# Patient Record
Sex: Male | Born: 1955 | Race: White | Hispanic: No | Marital: Married
Health system: Southern US, Community
[De-identification: ages and names within clinical notes are randomized; demographics above are authoritative.]

## PROBLEM LIST (undated history)

## (undated) DIAGNOSIS — I87099 Postthrombotic syndrome with other complications of unspecified lower extremity: Secondary | ICD-10-CM

## (undated) DIAGNOSIS — J80 Acute respiratory distress syndrome: Secondary | ICD-10-CM

## (undated) DIAGNOSIS — J9621 Acute and chronic respiratory failure with hypoxia: Secondary | ICD-10-CM

## (undated) DIAGNOSIS — U071 COVID-19: Secondary | ICD-10-CM

## (undated) DIAGNOSIS — J159 Unspecified bacterial pneumonia: Secondary | ICD-10-CM

---

## 2020-12-27 ENCOUNTER — Inpatient Hospital Stay
Admission: RE | Admit: 2020-12-27 | Discharge: 2021-04-05 | Disposition: A | Discharge status: Rehabilitation Facility | Payer: Federal, State, Local not specified - PPO | Source: Other Acute Inpatient Hospital | Attending: Internal Medicine | Admitting: Internal Medicine

## 2020-12-27 ENCOUNTER — Other Ambulatory Visit (HOSPITAL_COMMUNITY): Payer: Federal, State, Local not specified - PPO

## 2020-12-27 DIAGNOSIS — R0902 Hypoxemia: Secondary | ICD-10-CM

## 2020-12-27 DIAGNOSIS — J189 Pneumonia, unspecified organism: Secondary | ICD-10-CM

## 2020-12-27 DIAGNOSIS — J90 Pleural effusion, not elsewhere classified: Secondary | ICD-10-CM

## 2020-12-27 DIAGNOSIS — Z931 Gastrostomy status: Secondary | ICD-10-CM

## 2020-12-27 DIAGNOSIS — R509 Fever, unspecified: Secondary | ICD-10-CM

## 2020-12-27 DIAGNOSIS — J811 Chronic pulmonary edema: Secondary | ICD-10-CM

## 2020-12-27 DIAGNOSIS — Z9889 Other specified postprocedural states: Secondary | ICD-10-CM

## 2020-12-27 DIAGNOSIS — I4891 Unspecified atrial fibrillation: Secondary | ICD-10-CM

## 2020-12-27 DIAGNOSIS — I87099 Postthrombotic syndrome with other complications of unspecified lower extremity: Secondary | ICD-10-CM | POA: Diagnosis present

## 2020-12-27 DIAGNOSIS — R6889 Other general symptoms and signs: Secondary | ICD-10-CM

## 2020-12-27 DIAGNOSIS — D72829 Elevated white blood cell count, unspecified: Secondary | ICD-10-CM

## 2020-12-27 DIAGNOSIS — J80 Acute respiratory distress syndrome: Secondary | ICD-10-CM | POA: Diagnosis present

## 2020-12-27 DIAGNOSIS — J9621 Acute and chronic respiratory failure with hypoxia: Secondary | ICD-10-CM | POA: Diagnosis present

## 2020-12-27 DIAGNOSIS — J9690 Respiratory failure, unspecified, unspecified whether with hypoxia or hypercapnia: Secondary | ICD-10-CM

## 2020-12-27 DIAGNOSIS — Z9911 Dependence on respirator [ventilator] status: Secondary | ICD-10-CM

## 2020-12-27 DIAGNOSIS — R001 Bradycardia, unspecified: Secondary | ICD-10-CM

## 2020-12-27 DIAGNOSIS — U071 COVID-19: Secondary | ICD-10-CM | POA: Diagnosis present

## 2020-12-27 DIAGNOSIS — J159 Unspecified bacterial pneumonia: Secondary | ICD-10-CM | POA: Diagnosis present

## 2020-12-27 DIAGNOSIS — R0603 Acute respiratory distress: Secondary | ICD-10-CM

## 2020-12-27 HISTORY — DX: COVID-19: U07.1

## 2020-12-27 HISTORY — DX: Acute respiratory distress syndrome: J80

## 2020-12-27 HISTORY — DX: Postthrombotic syndrome with other complications of unspecified lower extremity: I87.099

## 2020-12-27 HISTORY — DX: Acute and chronic respiratory failure with hypoxia: J96.21

## 2020-12-27 HISTORY — DX: Unspecified bacterial pneumonia: J15.9

## 2020-12-28 ENCOUNTER — Other Ambulatory Visit (HOSPITAL_COMMUNITY): Payer: Federal, State, Local not specified - PPO

## 2020-12-28 DIAGNOSIS — U071 COVID-19: Secondary | ICD-10-CM

## 2020-12-28 DIAGNOSIS — J80 Acute respiratory distress syndrome: Secondary | ICD-10-CM

## 2020-12-28 DIAGNOSIS — I87099 Postthrombotic syndrome with other complications of unspecified lower extremity: Secondary | ICD-10-CM

## 2020-12-28 DIAGNOSIS — J9621 Acute and chronic respiratory failure with hypoxia: Secondary | ICD-10-CM

## 2020-12-28 DIAGNOSIS — Z9911 Dependence on respirator [ventilator] status: Secondary | ICD-10-CM

## 2020-12-28 DIAGNOSIS — J159 Unspecified bacterial pneumonia: Secondary | ICD-10-CM | POA: Diagnosis not present

## 2020-12-28 LAB — URINALYSIS, ROUTINE W REFLEX MICROSCOPIC
Bacteria, UA: NONE SEEN
Bilirubin Urine: NEGATIVE
Glucose, UA: NEGATIVE mg/dL
Ketones, ur: NEGATIVE mg/dL
Nitrite: NEGATIVE
Protein, ur: NEGATIVE mg/dL
Specific Gravity, Urine: 1.027 (ref 1.005–1.030)
pH: 6 (ref 5.0–8.0)

## 2020-12-28 LAB — BLOOD GAS, ARTERIAL
Acid-Base Excess: 3.7 mmol/L — ABNORMAL HIGH (ref 0.0–2.0)
Bicarbonate: 28 mmol/L (ref 20.0–28.0)
FIO2: 35
O2 Saturation: 90.1 %
Patient temperature: 36.3
pCO2 arterial: 42.6 mmHg (ref 32.0–48.0)
pH, Arterial: 7.429 (ref 7.350–7.450)
pO2, Arterial: 54.7 mmHg — ABNORMAL LOW (ref 83.0–108.0)

## 2020-12-28 MED ORDER — IOHEXOL 300 MG/ML  SOLN
100.0000 mL | Freq: Once | INTRAMUSCULAR | Status: AC
  Administered 2020-12-28: 100 mL via INTRAVENOUS

## 2020-12-28 NOTE — Consult Note (Signed)
Pulmonary Critical Care Medicine [facility] [place]  PULMONARY SERVICE  Date of Service: 12/28/2020  PULMONARY CRITICAL CARE CONSULT   Scott Roberts  IRS:[id number]  DOB: 1956-06-07   DOA: 12/27/2020  Referring Physician: [name], MD  HPI: Scott Roberts is a 65 y.o. male seen for follow up of Acute on Chronic Respiratory Failure.  Patient is transferred to our facility for further management and weaning.  Patient has multiple medical problems including hypertension type 2 diabetes hyperlipidemia who came into the hospital with complaints of increasing shortness of breath.  Patient was started on steroids however had decline in status and ended up having to be intubated.  After being on the ventilator for approximately 11 days patient was extubated however declined and had to be reintubated.  Patient subsequently had a tracheostomy done.  Complications associated with COVID-19 were noted including development of DVT pulmonary embolism for which the patient required anticoagulation.  Patient also did have renal failure requiring hemodialysis.  He is now transferred for further management and weaning.  Review of Systems:  ROS performed and is unremarkable other than noted above.  Past medical history: Diabetes mellitus type 2 DVT COVID-19 Hypertension Hyperlipidemia Respiratory failure  Past surgical history: Tracheostomy Peg  Family history: Noncontributory  Allergies: Reviewed on the medical record.    Medications: Reviewed on Rounds  Physical Exam:  Vitals: Temperature is 98.2 pulse 89 respiratory rate 28 blood pressures 155/87 saturations 92%  Ventilator Settings on assist control FiO2 was 45% volume 400 PEEP 5   General: Comfortable at this time  Eyes: Grossly normal lids, irises & conjunctiva  ENT: grossly tongue is normal  Neck: no obvious mass  Cardiovascular: S1-S2 normal no gallop or rub  Respiratory: No rhonchi coarse breath  sound  Abdomen: Soft and nontender  Skin: no rash seen on limited exam  Musculoskeletal: not rigid  Psychiatric:unable to assess  Neurologic: no seizure no involuntary movements         Labs on Admission:  Basic Metabolic Panel: Recent Labs  Lab 12/28/20 0419  NA 144  K 3.8  CL 107  CO2 26  GLUCOSE 97  BUN 29*  CREATININE 0.98  CALCIUM 7.6*    Recent Labs  Lab 12/28/20 0536  PHART 7.429  PCO2ART 42.6  PO2ART 54.7*  HCO3 28.0  O2SAT 90.1    Liver Function Tests: No results for input(s): AST, ALT, ALKPHOS, BILITOT, PROT, ALBUMIN in the last 168 hours. No results for input(s): LIPASE, AMYLASE in the last 168 hours. No results for input(s): AMMONIA in the last 168 hours.  CBC: Recent Labs  Lab 12/28/20 0419  WBC 14.0*  HGB 7.8*  HCT 25.5*  MCV 96.2  PLT 396    Cardiac Enzymes: No results for input(s): CKTOTAL, CKMB, CKMBINDEX, TROPONINI in the last 168 hours.  BNP (last 3 results) No results for input(s): BNP in the last 8760 hours.  ProBNP (last 3 results) No results for input(s): PROBNP in the last 8760 hours.   Radiological Exams on Admission: DG Chest 1 View  Result Date: 12/27/2020 CLINICAL DATA:  Status post PEG tube placement respiratory failure EXAM: CHEST  1 VIEW COMPARISON:  None. FINDINGS: Tracheostomy tube with tip about 4 cm superior to carina. Small left-sided pleural effusion. Widespread interstitial and alveolar disease. More confluent consolidation at the left base. Obscured cardiomediastinal silhouette. No pneumothorax. IMPRESSION: Widespread interstitial and alveolar disease which may reflect pulmonary edema or diffuse pneumonia. There are small bilateral pleural effusions. Electronically  Signed   By: Jasmine Pang M.D.   On: 12/27/2020 22:55   DG ABDOMEN PEG TUBE LOCATION  Result Date: 12/27/2020 CLINICAL DATA:  Percutaneous gastrostomy tube EXAM: ABDOMEN - 1 VIEW COMPARISON:  None. FINDINGS: Cutaneous staples to the left of  midline. Gastrostomy tube balloon projects over the body of the stomach. Mild gas-filled central small bowel with paucity of distal gas. IMPRESSION: Gastrostomy tube balloon projects over the body of the stomach. Mild gas-filled central small bowel, question ileus or obstruction. Electronically Signed   By: Jasmine Pang M.D.   On: 12/27/2020 22:56    Assessment/Plan Active Problems:   Acute on chronic respiratory failure with hypoxia (HCC)   COVID-19 virus infection   Acute respiratory distress syndrome (ARDS) due to COVID-19 virus [facility])   Healthcare associated bacterial pneumonia   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 Acute on chronic respiratory failure with hypoxia we will continue with assist control mode titrate oxygen continue pulmonary toilet patient's mechanics will be checked by the respiratory therapist today.  Chest x-ray results as noted above 2. COVID-19 virus infection recovery phase patient has had prolonged protracted course including development of pneumonitis as well as some hypercoagulable state. 3. DVT patient has been treated with Lovenox plan is to continue to monitor treated. 4. Healthcare associated pneumonia patient still has some changes of diffuse pneumonitis likely related to the underlying COVID-19 we are going to continue to monitor closely for any progression. 5. ARDS due to COVID-19 slow in resolution we will continue to follow along  I have personally seen and evaluated the patient, evaluated laboratory and imaging results, formulated the assessment and plan and placed orders. The Patient requires high complexity decision making with multiple systems involvement.  Case was discussed on Rounds with the Respiratory Therapy Director and the Respiratory staff Time Spent  Yevonne Pax, MD [facility] Pulmonary Critical Care Medicine Sleep Medicine

## 2020-12-29 DIAGNOSIS — J9621 Acute and chronic respiratory failure with hypoxia: Secondary | ICD-10-CM | POA: Diagnosis not present

## 2020-12-29 DIAGNOSIS — I87099 Postthrombotic syndrome with other complications of unspecified lower extremity: Secondary | ICD-10-CM | POA: Diagnosis not present

## 2020-12-29 DIAGNOSIS — U071 COVID-19: Secondary | ICD-10-CM | POA: Diagnosis not present

## 2020-12-29 DIAGNOSIS — J159 Unspecified bacterial pneumonia: Secondary | ICD-10-CM | POA: Diagnosis not present

## 2020-12-29 LAB — PHOSPHORUS: Phosphorus: 2.7 mg/dL (ref 2.5–4.6)

## 2020-12-29 LAB — HEMOGLOBIN A1C
Hgb A1c MFr Bld: 5.2 % (ref 4.8–5.6)
Mean Plasma Glucose: 102.54 mg/dL

## 2020-12-29 LAB — TSH: TSH: 2.546 u[IU]/mL (ref 0.350–4.500)

## 2020-12-29 LAB — MAGNESIUM: Magnesium: 1.5 mg/dL — ABNORMAL LOW (ref 1.7–2.4)

## 2020-12-29 LAB — T4, FREE: Free T4: 1.37 ng/dL — ABNORMAL HIGH (ref 0.61–1.12)

## 2020-12-29 NOTE — Progress Notes (Signed)
Pulmonary Critical Care Medicine [facility] [place]   PULMONARY CRITICAL CARE SERVICE  PROGRESS NOTE  Date of Service: 01/05/2021  Scott Roberts  QHU:[id number]  DOB: 1956/02/06   DOA: 12/27/2020  Referring Physician: [name], MD  HPI: Scott Roberts is a 65 y.o. adult seen for follow up of Acute on Chronic Respiratory Failure.  Patient currently is on full support Supposed to do about 4 hours of pressure support  Medications: Reviewed on Rounds  Physical Exam:  Vitals: Temperature is 98.7 pulse 109 respiratory rate is 37 blood pressure is 158/90 saturations 100%  Ventilator Settings on assist control FiO2 35% tidal volume 400 PEEP 5  . General: Comfortable at this time . Eyes: Grossly normal lids, irises & conjunctiva . ENT: grossly tongue is normal . Neck: no obvious mass . Cardiovascular: S1 S2 normal no gallop . Respiratory: No rhonchi no rales noted at this time . Abdomen: soft . Skin: no rash seen on limited exam . Musculoskeletal: not rigid . Psychiatric:unable to assess . Neurologic: no seizure no involuntary movements         Lab Data:   Basic Metabolic Panel: Recent Labs  Lab 12/30/20 0605 12/30/20 2359 01/01/21 0505 01/02/21 0428 01/03/21 0449 01/04/21 0700 01/05/21 0710  NA 144 144 145 146* 146* 145  --   K 3.5 3.4* 4.0  --  3.8 4.1  --   CL 105 109 108  --  107 105  --   CO2 27 26 28   --  30 31  --   GLUCOSE 150* 133* 141*  --  146* 162*  --   BUN 21 19 21   --  23 34*  --   CREATININE 0.77 0.66 0.64  --  0.61 0.64  --   CALCIUM 7.9* 8.0* 8.4*  --  8.3* 8.7*  --   MG 1.7 1.8 1.6* 1.8  --  1.7 1.8  PHOS 3.1 2.1* 2.5  --   --   --   --     ABG: Recent Labs  Lab 12/31/20 1540 01/01/21 0454 01/04/21 0730 01/04/21 1015 01/05/21 0640  PHART 7.378 7.307* 7.071* 7.320* 7.354  PCO2ART 51.0* 61.7* >120.0* 65.0* 59.4*  PO2ART 179* 85.5 74.8* 114* 128*  HCO3 29.4* 30.3* 33.6* 32.5* 32.2*  O2SAT 99.6 97.1 91.3 98.7 98.9     Liver Function Tests: No results for input(s): AST, ALT, ALKPHOS, BILITOT, PROT, ALBUMIN in the last 168 hours. No results for input(s): LIPASE, AMYLASE in the last 168 hours. Recent Labs  Lab 01/04/21 0415  AMMONIA 62*    CBC: Recent Labs  Lab 12/30/20 2359 12/31/20 0854 01/01/21 0505 01/03/21 0449 01/04/21 0700  WBC 16.4* 16.0* 15.2* 12.5* 19.9*  HGB 7.1* 7.7* 7.6* 7.5* 8.1*  HCT 22.2* 23.7* 25.4* 25.1* 28.5*  MCV 95.7 95.2 98.1 100.0 104.4*  PLT 433* 520* 511* 494* 712*    Cardiac Enzymes: No results for input(s): CKTOTAL, CKMB, CKMBINDEX, TROPONINI in the last 168 hours.  BNP (last 3 results) No results for input(s): BNP in the last 8760 hours.  ProBNP (last 3 results) No results for input(s): PROBNP in the last 8760 hours.  Radiological Exams: DG CHEST PORT 1 VIEW  Result Date: 01/04/2021 CLINICAL DATA:  Increased oxygen demand. EXAM: PORTABLE CHEST 1 VIEW COMPARISON:  Chest x-ray 01/02/2021, 01/01/2021. FINDINGS: Tracheostomy noted in stable position. Stable cardiomegaly. No pulmonary venous congestion. Diffuse dense bilateral pulmonary infiltrates. No significant interim change. Small bilateral pleural effusions can not be  excluded. No pneumothorax. No acute bony abnormality. IMPRESSION: 1. Tracheostomy tube in stable position. Diffuse dense bilateral pulmonary infiltrates/edema again noted. No significant interim change. 2.  Stable cardiomegaly.  No pulmonary venous congestion. Electronically Signed   By: Maisie Fus  Register   On: 01/04/2021 07:50    Assessment/Plan Active Problems:   Acute on chronic respiratory failure with hypoxia (HCC)   COVID-19 virus infection   Acute respiratory distress syndrome (ARDS) due to COVID-19 virus [facility])   Healthcare associated bacterial pneumonia   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 Acute on chronic respiratory failure hypoxia we will continue with weaning attempts goal is for 4 hours of  pressure support weaning 2. COVID-19 virus infection recovery phase we will continue to follow 3. ARDS treated slow improvement 4. Healthcare associated pneumonia patient has received antibiotics plan is to continue with supportive care 5. Chronic DVT treated we will continue with supportive care patient's been on anticoagulation   I have personally seen and evaluated the patient, evaluated laboratory and imaging results, formulated the assessment and plan and placed orders. The Patient requires high complexity decision making with multiple systems involvement.  Rounds were done with the Respiratory Therapy Director and Staff therapists and discussed with nursing staff also.  Yevonne Pax, MD [facility] Pulmonary Critical Care Medicine Sleep Medicine

## 2020-12-30 ENCOUNTER — Other Ambulatory Visit (HOSPITAL_COMMUNITY): Payer: Federal, State, Local not specified - PPO

## 2020-12-30 DIAGNOSIS — J9621 Acute and chronic respiratory failure with hypoxia: Secondary | ICD-10-CM | POA: Diagnosis not present

## 2020-12-30 DIAGNOSIS — U071 COVID-19: Secondary | ICD-10-CM | POA: Diagnosis not present

## 2020-12-30 DIAGNOSIS — J159 Unspecified bacterial pneumonia: Secondary | ICD-10-CM | POA: Diagnosis not present

## 2020-12-30 DIAGNOSIS — I87099 Postthrombotic syndrome with other complications of unspecified lower extremity: Secondary | ICD-10-CM | POA: Diagnosis not present

## 2020-12-30 LAB — BASIC METABOLIC PANEL
Anion gap: 11 (ref 5–15)
Anion gap: 12 (ref 5–15)
BUN: 29 mg/dL — ABNORMAL HIGH (ref 8–23)
BUN: 21 mg/dL (ref 8–23)
CO2: 26 mmol/L (ref 22–32)
CO2: 27 mmol/L (ref 22–32)
Calcium: 7.6 mg/dL — ABNORMAL LOW (ref 8.9–10.3)
Calcium: 7.9 mg/dL — ABNORMAL LOW (ref 8.9–10.3)
Chloride: 107 mmol/L (ref 98–111)
Chloride: 105 mmol/L (ref 98–111)
Creatinine, Ser: 0.98 mg/dL (ref 0.61–1.24)
Creatinine, Ser: 0.77 mg/dL (ref 0.61–1.24)
GFR, Estimated: >60 mL/min (ref >60)
GFR, Estimated: >60 mL/min (ref >60)
Glucose, Bld: 97 mg/dL (ref 70–99)
Glucose, Bld: 150 mg/dL — ABNORMAL HIGH (ref 70–99)
Potassium: 3.8 mmol/L (ref 3.5–5.1)
Potassium: 3.5 mmol/L (ref 3.5–5.1)
Sodium: 144 mmol/L (ref 135–145)
Sodium: 144 mmol/L (ref 135–145)

## 2020-12-30 LAB — CBC
HCT: 25.5 % — ABNORMAL LOW (ref 39.0–52.0)
HCT: 25.2 % — ABNORMAL LOW (ref 39.0–52.0)
HCT: 25.2 % — ABNORMAL LOW (ref 39.0–52.0)
Hemoglobin: 7.8 g/dL — ABNORMAL LOW (ref 13.0–17.0)
Hemoglobin: 8 g/dL — ABNORMAL LOW (ref 13.0–17.0)
Hemoglobin: 8 g/dL — ABNORMAL LOW (ref 13.0–17.0)
MCH: 29.4 pg (ref 26.0–34.0)
MCH: 30.3 pg (ref 26.0–34.0)
MCH: 30.4 pg (ref 26.0–34.0)
MCHC: 30.6 g/dL (ref 30.0–36.0)
MCHC: 31.7 g/dL (ref 30.0–36.0)
MCHC: 31.7 g/dL (ref 30.0–36.0)
MCV: 96.2 fL (ref 80.0–100.0)
MCV: 95.5 fL (ref 80.0–100.0)
MCV: 95.8 fL (ref 80.0–100.0)
Platelets: 396 10*3/uL (ref 150–400)
Platelets: 413 10*3/uL — ABNORMAL HIGH (ref 150–400)
Platelets: 484 10*3/uL — ABNORMAL HIGH (ref 150–400)
RBC: 2.65 MIL/uL — ABNORMAL LOW (ref 4.22–5.81)
RBC: 2.64 MIL/uL — ABNORMAL LOW (ref 4.22–5.81)
RBC: 2.63 MIL/uL — ABNORMAL LOW (ref 4.22–5.81)
RDW: 15 % (ref 11.5–15.5)
RDW: 14.8 % (ref 11.5–15.5)
RDW: 14.9 % (ref 11.5–15.5)
WBC: 14 10*3/uL — ABNORMAL HIGH (ref 4.0–10.5)
WBC: 13.9 10*3/uL — ABNORMAL HIGH (ref 4.0–10.5)
WBC: 19.9 10*3/uL — ABNORMAL HIGH (ref 4.0–10.5)
nRBC: 0 % (ref 0.0–0.2)
nRBC: 0 % (ref 0.0–0.2)
nRBC: 0 % (ref 0.0–0.2)

## 2020-12-30 LAB — COMPREHENSIVE METABOLIC PANEL
ALT: 20 U/L (ref 0–44)
AST: 21 U/L (ref 15–41)
Albumin: 2 g/dL — ABNORMAL LOW (ref 3.5–5.0)
Alkaline Phosphatase: 82 U/L (ref 38–126)
Anion gap: 11 (ref 5–15)
BUN: 20 mg/dL (ref 8–23)
CO2: 27 mmol/L (ref 22–32)
Calcium: 7.8 mg/dL — ABNORMAL LOW (ref 8.9–10.3)
Chloride: 106 mmol/L (ref 98–111)
Creatinine, Ser: 0.87 mg/dL (ref 0.61–1.24)
GFR, Estimated: >60 mL/min (ref >60)
Glucose, Bld: 128 mg/dL — ABNORMAL HIGH (ref 70–99)
Potassium: 3.4 mmol/L — ABNORMAL LOW (ref 3.5–5.1)
Sodium: 144 mmol/L (ref 135–145)
Total Bilirubin: 0.5 mg/dL (ref 0.3–1.2)
Total Protein: 5.7 g/dL — ABNORMAL LOW (ref 6.5–8.1)

## 2020-12-30 LAB — URINE CULTURE: Culture: <10000 — AB

## 2020-12-30 LAB — PHOSPHORUS: Phosphorus: 3.1 mg/dL (ref 2.5–4.6)

## 2020-12-30 LAB — TROPONIN I (HIGH SENSITIVITY)
Troponin I (High Sensitivity): 32 ng/L — ABNORMAL HIGH (ref <18)
Troponin I (High Sensitivity): 31 ng/L — ABNORMAL HIGH (ref <18)

## 2020-12-30 LAB — MAGNESIUM: Magnesium: 1.7 mg/dL (ref 1.7–2.4)

## 2020-12-30 NOTE — Progress Notes (Signed)
Pulmonary Critical Care Medicine [facility] [place]   PULMONARY CRITICAL CARE SERVICE  PROGRESS NOTE  Date of Service: 12/30/2020  TREW SUNDE  WVP:[id number]  DOB: [birth year]   DOA: 12/27/2020  Referring Physician: [name], MD  HPI: ROWAN BLAKER is a 65 y.o. adult seen for follow up of Acute on Chronic Respiratory Failure. Patient at this time is on pressure support has been on 40% FiO2 currently is weaning on pressure of 12/5  Medications: Reviewed on Rounds  Physical Exam:  Vitals: Temperature is 97.7 pulse 96 respiratory rate 37 blood pressure is 145/87 saturations 95%  Ventilator Settings on pressure support FiO2 40% pressure 12/5   General: Comfortable at this time  Eyes: Grossly normal lids, irises & conjunctiva  ENT: grossly tongue is normal  Neck: no obvious mass  Cardiovascular: S1 S2 normal no gallop  Respiratory: No rhonchi no rales noted at this time  Abdomen: soft  Skin: no rash seen on limited exam  Musculoskeletal: not rigid  Psychiatric:unable to assess  Neurologic: no seizure no involuntary movements         Lab Data:   Basic Metabolic Panel: Recent Labs  Lab 12/28/20 0419 12/29/20 0634 12/30/20 0605  NA 144 144 144  K 3.8 3.4* 3.5  CL 107 106 105  CO2 26 27 27   GLUCOSE 97 128* 150*  BUN 29* 20 21  CREATININE 0.98 0.87 0.77  CALCIUM 7.6* 7.8* 7.9*  MG  --  1.5* 1.7  PHOS  --  2.7 3.1    ABG: Recent Labs  Lab 12/28/20 0536  PHART 7.429  PCO2ART 42.6  PO2ART 54.7*  HCO3 28.0  O2SAT 90.1    Liver Function Tests: Recent Labs  Lab 12/29/20 0634  AST 21  ALT 20  ALKPHOS 82  BILITOT 0.5  PROT 5.7*  ALBUMIN 2.0*   No results for input(s): LIPASE, AMYLASE in the last 168 hours. No results for input(s): AMMONIA in the last 168 hours.  CBC: Recent Labs  Lab 12/28/20 0419 12/29/20 0634 12/30/20 0605  WBC 14.0* 13.9* 19.9*  HGB 7.8* 8.0* 8.0*  HCT 25.5* 25.2* 25.2*  MCV 96.2 95.5 95.8  PLT 396  413* 484*    Cardiac Enzymes: No results for input(s): CKTOTAL, CKMB, CKMBINDEX, TROPONINI in the last 168 hours.  BNP (last 3 results) No results for input(s): BNP in the last 8760 hours.  ProBNP (last 3 results) No results for input(s): PROBNP in the last 8760 hours.  Radiological Exams: CT ABDOMEN PELVIS W CONTRAST  Result Date: 12/28/2020 CLINICAL DATA:  COVID complications. Small-bowel distention on x-ray. Evaluate for obstruction. EXAM: CT ABDOMEN AND PELVIS WITH CONTRAST TECHNIQUE: Multidetector CT imaging of the abdomen and pelvis was performed using the standard protocol following bolus administration of intravenous contrast. CONTRAST:  02/25/2021 OMNIPAQUE IOHEXOL 300 MG/ML  SOLN COMPARISON:  X-ray 12/27/2020 FINDINGS: Lower chest: Extensive bilateral airspace disease is associated with moderate bilateral pleural effusions. Hepatobiliary: 12 mm hypoattenuating liver lesion 1 image 17/series 3 cannot be definitively characterized but is likely benign. There is no evidence for gallstones, gallbladder wall thickening, or pericholecystic fluid. No intrahepatic or extrahepatic biliary dilation. Pancreas: No focal mass lesion. No dilatation of the main duct. No intraparenchymal cyst. No peripancreatic edema. Spleen: No splenomegaly. No focal mass lesion. Adrenals/Urinary Tract: No adrenal nodule or mass. Right kidney unremarkable. Filling defects are seen in the left renal pelvis and posterior calices (well seen on coronal images 111-115 of series 6). No evidence for  hydroureter. Foley catheter decompresses the urinary bladder and gas in the bladder is compatible with the instrumentation. Stomach/Bowel: Small hiatal hernia. Gastrostomy tube noted. Duodenum is normally positioned as is the ligament of Treitz. No small bowel wall thickening. No small bowel dilatation. The terminal ileum is normal. The appendix is not visualized, but there is no edema or inflammation in the region of the cecum. No gross  colonic mass. No colonic wall thickening. Diverticular changes are noted in the left colon without evidence of diverticulitis. Rectal tube noted in situ. Vascular/Lymphatic: No abdominal aortic aneurysm. No abdominal lymphadenopathy. Reproductive: Unremarkable. Other: Trace intraperitoneal free fluid. Musculoskeletal: Gas in the subcutaneous fat of the anterior abdominal wall likely related to injection site. Diffuse body wall edema evident. No worrisome lytic or sclerotic osseous abnormality. IMPRESSION: 1. No evidence for small bowel obstruction. 2. Extensive bilateral airspace disease associated with moderate bilateral pleural effusions. Imaging features compatible with multifocal pneumonia. 3. Filling defects in the left renal pelvis and posterior calices. Findings could be related to blood clot or infectious debris. Urothelial neoplasm not excluded. 4. 12 mm hypoattenuating liver lesion cannot be definitively characterized but is likely benign. MRI of the abdomen with and without contrast could be used to further evaluate as clinically warranted. 5. Small volume intraperitoneal free fluid. 6. Diffuse body wall edema. Electronically Signed   By: Kennith Center M.D.   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 Acute on chronic respiratory failure with hypoxia we'll continue to wean on pressure support titrate oxygen continue pulmonary toilet. 2. COVID-19 virus infection in recovery we will continue with supportive care. 3. ARDS treated slow improvement is noted.  Patient had to have the ventilator adjustments made. 4. Healthcare associated pneumonia treated slow improvement 5. Chronic DVT has been on anticoagulation  no sign of bleed   I have personally seen and evaluated the patient, evaluated laboratory and imaging results, formulated the assessment and plan and placed orders. The Patient requires high complexity decision making with multiple systems involvement.  Rounds were done with the Respiratory Therapy Director and Staff therapists and discussed with nursing staff also.  Yevonne Pax, MD [facility] Pulmonary Critical Care Medicine Sleep Medicine

## 2020-12-31 ENCOUNTER — Other Ambulatory Visit (HOSPITAL_COMMUNITY): Payer: Federal, State, Local not specified - PPO

## 2020-12-31 DIAGNOSIS — U071 COVID-19: Secondary | ICD-10-CM | POA: Diagnosis not present

## 2020-12-31 DIAGNOSIS — I87099 Postthrombotic syndrome with other complications of unspecified lower extremity: Secondary | ICD-10-CM | POA: Diagnosis not present

## 2020-12-31 DIAGNOSIS — J159 Unspecified bacterial pneumonia: Secondary | ICD-10-CM | POA: Diagnosis not present

## 2020-12-31 DIAGNOSIS — J9621 Acute and chronic respiratory failure with hypoxia: Secondary | ICD-10-CM | POA: Diagnosis not present

## 2020-12-31 LAB — CBC
HCT: 22.2 % — ABNORMAL LOW (ref 39.0–52.0)
HCT: 23.7 % — ABNORMAL LOW (ref 39.0–52.0)
Hemoglobin: 7.1 g/dL — ABNORMAL LOW (ref 13.0–17.0)
Hemoglobin: 7.7 g/dL — ABNORMAL LOW (ref 13.0–17.0)
MCH: 30.6 pg (ref 26.0–34.0)
MCH: 30.9 pg (ref 26.0–34.0)
MCHC: 32 g/dL (ref 30.0–36.0)
MCHC: 32.5 g/dL (ref 30.0–36.0)
MCV: 95.2 fL (ref 80.0–100.0)
MCV: 95.7 fL (ref 80.0–100.0)
Platelets: 433 10*3/uL — ABNORMAL HIGH (ref 150–400)
Platelets: 520 10*3/uL — ABNORMAL HIGH (ref 150–400)
RBC: 2.32 MIL/uL — ABNORMAL LOW (ref 4.22–5.81)
RBC: 2.49 MIL/uL — ABNORMAL LOW (ref 4.22–5.81)
RDW: 15.1 % (ref 11.5–15.5)
RDW: 15.2 % (ref 11.5–15.5)
WBC: 16 10*3/uL — ABNORMAL HIGH (ref 4.0–10.5)
WBC: 16.4 10*3/uL — ABNORMAL HIGH (ref 4.0–10.5)
nRBC: 0 % (ref 0.0–0.2)
nRBC: 0 % (ref 0.0–0.2)

## 2020-12-31 LAB — BASIC METABOLIC PANEL
Anion gap: 9 (ref 5–15)
BUN: 19 mg/dL (ref 8–23)
CO2: 26 mmol/L (ref 22–32)
Calcium: 8 mg/dL — ABNORMAL LOW (ref 8.9–10.3)
Chloride: 109 mmol/L (ref 98–111)
Creatinine, Ser: 0.66 mg/dL (ref 0.61–1.24)
GFR, Estimated: >60 mL/min (ref >60)
Glucose, Bld: 133 mg/dL — ABNORMAL HIGH (ref 70–99)
Potassium: 3.4 mmol/L — ABNORMAL LOW (ref 3.5–5.1)
Sodium: 144 mmol/L (ref 135–145)

## 2020-12-31 LAB — CULTURE, RESPIRATORY W GRAM STAIN

## 2020-12-31 LAB — BLOOD GAS, ARTERIAL
Acid-Base Excess: 3.4 mmol/L — ABNORMAL HIGH (ref 0.0–2.0)
Acid-Base Excess: 4.5 mmol/L — ABNORMAL HIGH (ref 0.0–2.0)
Bicarbonate: 29.7 mmol/L — ABNORMAL HIGH (ref 20.0–28.0)
Bicarbonate: 29.4 mmol/L — ABNORMAL HIGH (ref 20.0–28.0)
FIO2: 40
FIO2: 40
O2 Saturation: 92.4 %
O2 Saturation: 99.6 %
Patient temperature: 36.7
Patient temperature: 36.7
pCO2 arterial: 64.5 mmHg — ABNORMAL HIGH (ref 32.0–48.0)
pCO2 arterial: 51 mmHg — ABNORMAL HIGH (ref 32.0–48.0)
pH, Arterial: 7.283 — ABNORMAL LOW (ref 7.350–7.450)
pH, Arterial: 7.378 (ref 7.350–7.450)
pO2, Arterial: 67.4 mmHg — ABNORMAL LOW (ref 83.0–108.0)
pO2, Arterial: 179 mmHg — ABNORMAL HIGH (ref 83.0–108.0)

## 2020-12-31 LAB — MAGNESIUM: Magnesium: 1.8 mg/dL (ref 1.7–2.4)

## 2020-12-31 LAB — OCCULT BLOOD X 1 CARD TO LAB, STOOL: Fecal Occult Bld: NEGATIVE

## 2020-12-31 LAB — PHOSPHORUS: Phosphorus: 2.1 mg/dL — ABNORMAL LOW (ref 2.5–4.6)

## 2020-12-31 LAB — TROPONIN I (HIGH SENSITIVITY): Troponin I (High Sensitivity): 29 ng/L — ABNORMAL HIGH (ref <18)

## 2020-12-31 NOTE — Progress Notes (Signed)
Pulmonary Critical Care Medicine [facility] [place]   PULMONARY CRITICAL CARE SERVICE  PROGRESS NOTE  Date of Service: 12/31/2020  Scott Roberts  DJS:[id number]  DOB: [birth year]   DOA: 12/27/2020  Referring Physician: [name], MD  HPI: Scott Roberts is a 65 y.o. adult seen for follow up of Acute on Chronic Respiratory Failure.  Patient is on full support on assist control mode currently on 35% FiO2 with a PEEP of 5 good saturations  Medications: Reviewed on Rounds  Physical Exam:  Vitals: Temperature is 98.4 pulse 107 respiratory 35 blood pressure is 155/92 saturations 99%  Ventilator Settings on Assist control FiO2 is 35% tidal volume 400 PEEP 5  . General: Comfortable at this time . Eyes: Grossly normal lids, irises & conjunctiva . ENT: grossly tongue is normal . Neck: no obvious mass . Cardiovascular: S1 S2 normal no gallop . Respiratory: Scattered rhonchi expansion is equal . Abdomen: soft . Skin: no rash seen on limited exam . Musculoskeletal: not rigid . Psychiatric:unable to assess . Neurologic: no seizure no involuntary movements         Lab Data:   Basic Metabolic Panel: Recent Labs  Lab 12/28/20 0419 12/29/20 0634 12/30/20 0605 12/30/20 2359  NA 144 144 144 144  K 3.8 3.4* 3.5 3.4*  CL 107 106 105 109  CO2 26 27 27 26   GLUCOSE 97 128* 150* 133*  BUN 29* 20 21 19   CREATININE 0.98 0.87 0.77 0.66  CALCIUM 7.6* 7.8* 7.9* 8.0*  MG  --  1.5* 1.7 1.8  PHOS  --  2.7 3.1 2.1*    ABG: Recent Labs  Lab 12/28/20 0536  PHART 7.429  PCO2ART 42.6  PO2ART 54.7*  HCO3 28.0  O2SAT 90.1    Liver Function Tests: Recent Labs  Lab 12/29/20 0634  AST 21  ALT 20  ALKPHOS 82  BILITOT 0.5  PROT 5.7*  ALBUMIN 2.0*   No results for input(s): LIPASE, AMYLASE in the last 168 hours. No results for input(s): AMMONIA in the last 168 hours.  CBC: Recent Labs  Lab 12/28/20 0419 12/29/20 0634 12/30/20 0605 12/30/20 2359 12/31/20 0854   WBC 14.0* 13.9* 19.9* 16.4* 16.0*  HGB 7.8* 8.0* 8.0* 7.1* 7.7*  HCT 25.5* 25.2* 25.2* 22.2* 23.7*  MCV 96.2 95.5 95.8 95.7 95.2  PLT 396 413* 484* 433* 520*    Cardiac Enzymes: No results for input(s): CKTOTAL, CKMB, CKMBINDEX, TROPONINI in the last 168 hours.  BNP (last 3 results) No results for input(s): BNP in the last 8760 hours.  ProBNP (last 3 results) No results for input(s): PROBNP in the last 8760 hours.  Radiological Exams: DG Chest Port 1 View  Result Date: 12/31/2020 CLINICAL DATA:  COVID-19 infection plus pulmonary edema. Evaluate pleural effusions. EXAM: PORTABLE CHEST 1 VIEW COMPARISON:  Chest x-rays dated 12/30/2020 and 12/27/2020. FINDINGS: Tracheostomy tube appears stable in position with tip at the midline. Heart size and mediastinal contours appear stable. Diffuse bilateral airspace opacities are unchanged. Small bilateral pleural effusions appears stable. Suspect superimposed atelectasis at the LEFT lung base. No new lung findings. No pneumothorax is seen. IMPRESSION: 1. Stable chest x-ray. Diffuse bilateral airspace opacities are unchanged, compatible with multifocal pneumonia and/or pulmonary edema. Favor pneumonia. 2. Stable small bilateral pleural effusions. Probable superimposed atelectasis at the LEFT lung base. Electronically Signed   By: 02/27/2021 M.D.   On: 12/31/2020 07:36   DG Chest Port 1 View  Result Date: 12/30/2020 CLINICAL DATA:  Bradycardia  EXAM: PORTABLE CHEST 1 VIEW COMPARISON:  December 27, 2020 FINDINGS: The cardiomediastinal silhouette is unchanged and obscured in contour.Tracheostomy with tip terminating approximately 5.3 cm above the carina. Small bilateral pleural effusions. This is similar comparison to prior. No pneumothorax. Diffuse interstitial and heterogeneous opacities, unchanged. Visualized abdomen is unremarkable. Multilevel degenerative changes of the thoracic spine. IMPRESSION: Small bilateral pleural effusions and diffuse  interstitial and heterogeneous opacities, unchanged. Findings may reflect a combination of the sequela of COVID-19 infection plus pulmonary edema. 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 Acute on chronic respiratory failure hypoxia plan is going to be to continue with full support on the ventilator.  Patient apparently suffered a rapid response with bradycardia episode holding off on weaning this morning 2. COVID-19 virus infection in recovery phase we will continue with supportive care 3. ARDS very slow improvement we will continue to follow along. 4. Healthcare associated pneumonia treated with antibiotics we will continue to monitor. 5. Chronic DVT on anticoagulation   I have personally seen and evaluated the patient, evaluated laboratory and imaging results, formulated the assessment and plan and placed orders. The Patient requires high complexity decision making with multiple systems involvement.  Rounds were done with the Respiratory Therapy Director and Staff therapists and discussed with nursing staff also.  Yevonne Pax, MD [facility] Pulmonary Critical Care Medicine Sleep Medicine

## 2021-01-01 ENCOUNTER — Other Ambulatory Visit (HOSPITAL_COMMUNITY): Payer: Federal, State, Local not specified - PPO

## 2021-01-01 DIAGNOSIS — I87099 Postthrombotic syndrome with other complications of unspecified lower extremity: Secondary | ICD-10-CM | POA: Diagnosis not present

## 2021-01-01 DIAGNOSIS — J9621 Acute and chronic respiratory failure with hypoxia: Secondary | ICD-10-CM | POA: Diagnosis not present

## 2021-01-01 DIAGNOSIS — J159 Unspecified bacterial pneumonia: Secondary | ICD-10-CM | POA: Diagnosis not present

## 2021-01-01 DIAGNOSIS — U071 COVID-19: Secondary | ICD-10-CM | POA: Diagnosis not present

## 2021-01-01 LAB — BASIC METABOLIC PANEL
Anion gap: 9 (ref 5–15)
BUN: 21 mg/dL (ref 8–23)
CO2: 28 mmol/L (ref 22–32)
Calcium: 8.4 mg/dL — ABNORMAL LOW (ref 8.9–10.3)
Chloride: 108 mmol/L (ref 98–111)
Creatinine, Ser: 0.64 mg/dL (ref 0.61–1.24)
GFR, Estimated: >60 mL/min (ref >60)
Glucose, Bld: 141 mg/dL — ABNORMAL HIGH (ref 70–99)
Potassium: 4 mmol/L (ref 3.5–5.1)
Sodium: 145 mmol/L (ref 135–145)

## 2021-01-01 LAB — BLOOD GAS, ARTERIAL
Acid-Base Excess: 4.2 mmol/L — ABNORMAL HIGH (ref 0.0–2.0)
Bicarbonate: 30.3 mmol/L — ABNORMAL HIGH (ref 20.0–28.0)
FIO2: 40
O2 Saturation: 97.1 %
Patient temperature: 36.1
pCO2 arterial: 61.7 mmHg — ABNORMAL HIGH (ref 32.0–48.0)
pH, Arterial: 7.307 — ABNORMAL LOW (ref 7.350–7.450)
pO2, Arterial: 85.5 mmHg (ref 83.0–108.0)

## 2021-01-01 LAB — PHOSPHORUS: Phosphorus: 2.5 mg/dL (ref 2.5–4.6)

## 2021-01-01 LAB — CBC
HCT: 25.4 % — ABNORMAL LOW (ref 39.0–52.0)
Hemoglobin: 7.6 g/dL — ABNORMAL LOW (ref 13.0–17.0)
MCH: 29.3 pg (ref 26.0–34.0)
MCHC: 29.9 g/dL — ABNORMAL LOW (ref 30.0–36.0)
MCV: 98.1 fL (ref 80.0–100.0)
Platelets: 511 10*3/uL — ABNORMAL HIGH (ref 150–400)
RBC: 2.59 MIL/uL — ABNORMAL LOW (ref 4.22–5.81)
RDW: 15.4 % (ref 11.5–15.5)
WBC: 15.2 10*3/uL — ABNORMAL HIGH (ref 4.0–10.5)
nRBC: 0 % (ref 0.0–0.2)

## 2021-01-01 LAB — MAGNESIUM: Magnesium: 1.6 mg/dL — ABNORMAL LOW (ref 1.7–2.4)

## 2021-01-01 NOTE — Progress Notes (Signed)
Pulmonary Critical Care Medicine [facility] [place]   PULMONARY CRITICAL CARE SERVICE  PROGRESS NOTE  Date of Service: 01/01/2021  Scott Roberts  GDJ:[id number]  DOB: [birth year]   DOA: 12/27/2020  Referring Physician: [name], MD  HPI: Scott Roberts is a 65 y.o. adult seen for follow up of Acute on Chronic Respiratory Failure.  Patient is on pressure support currently on 40% FiO2 goal of up to 12-hour  Medications: Reviewed on Rounds  Physical Exam:  Vitals: Temperature is 97.0 pulse 97 respiratory 30 blood pressure is 134/71 saturations 100%  Ventilator Settings on pressure support FiO2 40% pressure of 12/7  . General: Comfortable at this time . Eyes: Grossly normal lids, irises & conjunctiva . ENT: grossly tongue is normal . Neck: no obvious mass . Cardiovascular: S1 S2 normal no gallop . Respiratory: No rhonchi no rales are noted at this . Abdomen: soft . Skin: no rash seen on limited exam . Musculoskeletal: not rigid . Psychiatric:unable to assess . Neurologic: no seizure no involuntary movements         Lab Data:   Basic Metabolic Panel: Recent Labs  Lab 12/28/20 0419 12/29/20 0634 12/30/20 0605 12/30/20 2359 01/01/21 0505  NA 144 144 144 144 145  K 3.8 3.4* 3.5 3.4* 4.0  CL 107 106 105 109 108  CO2 26 27 27 26 28   GLUCOSE 97 128* 150* 133* 141*  BUN 29* 20 21 19 21   CREATININE 0.98 0.87 0.77 0.66 0.64  CALCIUM 7.6* 7.8* 7.9* 8.0* 8.4*  MG  --  1.5* 1.7 1.8 1.6*  PHOS  --  2.7 3.1 2.1* 2.5    ABG: Recent Labs  Lab 12/28/20 0536 12/31/20 1235 12/31/20 1540 01/01/21 0454  PHART 7.429 7.283* 7.378 7.307*  PCO2ART 42.6 64.5* 51.0* 61.7*  PO2ART 54.7* 67.4* 179* 85.5  HCO3 28.0 29.7* 29.4* 30.3*  O2SAT 90.1 92.4 99.6 97.1    Liver Function Tests: Recent Labs  Lab 12/29/20 0634  AST 21  ALT 20  ALKPHOS 82  BILITOT 0.5  PROT 5.7*  ALBUMIN 2.0*   No results for input(s): LIPASE, AMYLASE in the last 168 hours. No results  for input(s): AMMONIA in the last 168 hours.  CBC: Recent Labs  Lab 12/29/20 0634 12/30/20 0605 12/30/20 2359 12/31/20 0854 01/01/21 0505  WBC 13.9* 19.9* 16.4* 16.0* 15.2*  HGB 8.0* 8.0* 7.1* 7.7* 7.6*  HCT 25.2* 25.2* 22.2* 23.7* 25.4*  MCV 95.5 95.8 95.7 95.2 98.1  PLT 413* 484* 433* 520* 511*    Cardiac Enzymes: No results for input(s): CKTOTAL, CKMB, CKMBINDEX, TROPONINI in the last 168 hours.  BNP (last 3 results) No results for input(s): BNP in the last 8760 hours.  ProBNP (last 3 results) No results for input(s): PROBNP in the last 8760 hours.  Radiological Exams: DG CHEST PORT 1 VIEW  Result Date: 01/01/2021 CLINICAL DATA:  Ventilator dependent respiratory failure. Pneumonia. EXAM: PORTABLE CHEST 1 VIEW COMPARISON:  Yesterday FINDINGS: Stable bilateral pulmonary infiltrates. No visible air leak. Small left pleural effusion. Cardiomegaly and vascular pedicle widening. Tracheostomy tube in similar position. IMPRESSION: Unchanged diffuse infiltrates with small left effusion. Electronically Signed   By: 03/01/21 M.D.   On: 01/01/2021 06:56   DG Chest Port 1 View  Result Date: 12/31/2020 CLINICAL DATA:  COVID-19 infection plus pulmonary edema. Evaluate pleural effusions. EXAM: PORTABLE CHEST 1 VIEW COMPARISON:  Chest x-rays dated 12/30/2020 and 12/27/2020. FINDINGS: Tracheostomy tube appears stable in position with tip at the  midline. Heart size and mediastinal contours appear stable. Diffuse bilateral airspace opacities are unchanged. Small bilateral pleural effusions appears stable. Suspect superimposed atelectasis at the LEFT lung base. No new lung findings. No pneumothorax is seen. IMPRESSION: 1. Stable chest x-ray. Diffuse bilateral airspace opacities are unchanged, compatible with multifocal pneumonia and/or pulmonary edema. Favor pneumonia. 2. Stable small bilateral pleural effusions. Probable superimposed atelectasis at the LEFT lung base. Electronically Signed    By: Bary Richard M.D.   On: 12/31/2020 07:36   DG Chest Port 1 View  Result Date: 12/30/2020 CLINICAL DATA:  Bradycardia EXAM: PORTABLE CHEST 1 VIEW COMPARISON:  December 27, 2020 FINDINGS: The cardiomediastinal silhouette is unchanged and obscured in contour.Tracheostomy with tip terminating approximately 5.3 cm above the carina. Small bilateral pleural effusions. This is similar comparison to prior. No pneumothorax. Diffuse interstitial and heterogeneous opacities, unchanged. Visualized abdomen is unremarkable. Multilevel degenerative changes of the thoracic spine. IMPRESSION: Small bilateral pleural effusions and diffuse interstitial and heterogeneous opacities, unchanged. Findings may reflect a combination of the sequela of COVID-19 infection plus pulmonary edema. 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 Acute on chronic respiratory failure hypoxia we will continue with the weaning protocol patient is making good progress so far 2. COVID-19 virus infection in recovery phase we will continue with supportive care 3. ARDS due to COVID-19 has shown some improvement we will continue to follow 4. Healthcare associated pneumonia repeated with antibiotics consider infectious disease evaluation 5. DVT treated no active bleeding   I have personally seen and evaluated the patient, evaluated laboratory and imaging results, formulated the assessment and plan and placed orders. The Patient requires high complexity decision making with multiple systems involvement.  Rounds were done with the Respiratory Therapy Director and Staff therapists and discussed with nursing  staff also.  Yevonne Pax, MD [facility] Pulmonary Critical Care Medicine Sleep Medicine

## 2021-01-02 ENCOUNTER — Other Ambulatory Visit (HOSPITAL_COMMUNITY): Payer: Federal, State, Local not specified - PPO

## 2021-01-02 DIAGNOSIS — J9621 Acute and chronic respiratory failure with hypoxia: Secondary | ICD-10-CM | POA: Diagnosis not present

## 2021-01-02 DIAGNOSIS — I87099 Postthrombotic syndrome with other complications of unspecified lower extremity: Secondary | ICD-10-CM | POA: Diagnosis not present

## 2021-01-02 DIAGNOSIS — J159 Unspecified bacterial pneumonia: Secondary | ICD-10-CM | POA: Diagnosis not present

## 2021-01-02 DIAGNOSIS — U071 COVID-19: Secondary | ICD-10-CM | POA: Diagnosis not present

## 2021-01-02 LAB — MAGNESIUM: Magnesium: 1.8 mg/dL (ref 1.7–2.4)

## 2021-01-02 LAB — SODIUM: Sodium: 146 mmol/L — ABNORMAL HIGH (ref 135–145)

## 2021-01-02 NOTE — Progress Notes (Signed)
Pulmonary Critical Care Medicine [facility] [place]   PULMONARY CRITICAL CARE SERVICE  PROGRESS NOTE  Date of Service: 01/02/2021  Scott Roberts  LZJ:[id number]  DOB: [birth year]   DOA: 12/27/2020  Referring Physician: [name], MD  HPI: Scott Roberts is a 65 y.o. adult seen for follow up of Acute on Chronic Respiratory Failure.  Patient currently is resting comfortably without distress has been on weaning protocol did 12 hours of pressure support today we will continue to build on vent  Medications: Reviewed on Rounds  Physical Exam:  Vitals: Temperature 98.4 pulse 109 respiratory rate 36 blood pressure 117/88 saturations 97%  Ventilator Settings currently is on pressure support 12/7 we will continue with the weaning right now requiring 40% FiO2  . General: Comfortable at this time . Eyes: Grossly normal lids, irises & conjunctiva . ENT: grossly tongue is normal . Neck: no obvious mass . Cardiovascular: S1 S2 normal no gallop . Respiratory: No rhonchi very coarse breath sounds . Abdomen: soft . Skin: no rash seen on limited exam . Musculoskeletal: not rigid . Psychiatric:unable to assess . Neurologic: no seizure no involuntary movements         Lab Data:   Basic Metabolic Panel: Recent Labs  Lab 12/28/20 0419 12/29/20 0634 12/30/20 0605 12/30/20 2359 01/01/21 0505 01/02/21 0428  NA 144 144 144 144 145 146*  K 3.8 3.4* 3.5 3.4* 4.0  --   CL 107 106 105 109 108  --   CO2 26 27 27 26 28   --   GLUCOSE 97 128* 150* 133* 141*  --   BUN 29* 20 21 19 21   --   CREATININE 0.98 0.87 0.77 0.66 0.64  --   CALCIUM 7.6* 7.8* 7.9* 8.0* 8.4*  --   MG  --  1.5* 1.7 1.8 1.6* 1.8  PHOS  --  2.7 3.1 2.1* 2.5  --     ABG: Recent Labs  Lab 12/28/20 0536 12/31/20 1235 12/31/20 1540 01/01/21 0454  PHART 7.429 7.283* 7.378 7.307*  PCO2ART 42.6 64.5* 51.0* 61.7*  PO2ART 54.7* 67.4* 179* 85.5  HCO3 28.0 29.7* 29.4* 30.3*  O2SAT 90.1 92.4 99.6 97.1    Liver  Function Tests: Recent Labs  Lab 12/29/20 0634  AST 21  ALT 20  ALKPHOS 82  BILITOT 0.5  PROT 5.7*  ALBUMIN 2.0*   No results for input(s): LIPASE, AMYLASE in the last 168 hours. No results for input(s): AMMONIA in the last 168 hours.  CBC: Recent Labs  Lab 12/29/20 0634 12/30/20 0605 12/30/20 2359 12/31/20 0854 01/01/21 0505  WBC 13.9* 19.9* 16.4* 16.0* 15.2*  HGB 8.0* 8.0* 7.1* 7.7* 7.6*  HCT 25.2* 25.2* 22.2* 23.7* 25.4*  MCV 95.5 95.8 95.7 95.2 98.1  PLT 413* 484* 433* 520* 511*    Cardiac Enzymes: No results for input(s): CKTOTAL, CKMB, CKMBINDEX, TROPONINI in the last 168 hours.  BNP (last 3 results) No results for input(s): BNP in the last 8760 hours.  ProBNP (last 3 results) No results for input(s): PROBNP in the last 8760 hours.  Radiological Exams: DG CHEST PORT 1 VIEW  Result Date: 01/02/2021 CLINICAL DATA:  Atrial fibrillation. EXAM: PORTABLE CHEST 1 VIEW COMPARISON:  Radiograph yesterday. FINDINGS: Tracheostomy tube unchanged in position at the thoracic inlet. Cardiomegaly is not significantly changed. Stable mediastinal contours. Heterogeneous bilateral lung opacities are not significantly changed. Small bilateral pleural effusions. No pneumothorax. IMPRESSION: Unchanged cardiomegaly, bilateral heterogeneous lung opacities and small pleural effusions. No significant change from  prior imaging. Electronically Signed   By: Narda Rutherford M.D.   On: 01/02/2021 15:13   DG CHEST PORT 1 VIEW  Result Date: 01/01/2021 CLINICAL DATA:  Ventilator dependent respiratory failure. Pneumonia. EXAM: PORTABLE CHEST 1 VIEW COMPARISON:  Yesterday FINDINGS: Stable bilateral pulmonary infiltrates. No visible air leak. Small left pleural effusion. Cardiomegaly and vascular pedicle widening. Tracheostomy tube in similar position. IMPRESSION: Unchanged diffuse infiltrates with small left effusion. Electronically Signed   By: Marnee Spring M.D.   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 Acute on chronic respiratory failure with hypoxia plan is going to continue to wean on protocol pressure support as tolerated patient currently is on 40% FiO2 2. ARDS treated slow improvement we will continue to monitor 3. COVID-19 virus infection recovery phase 4. Healthcare associated pneumonia has been treated follow-up x-rays 5. Chronic DVT treated with slow improvement   I have personally seen and evaluated the patient, evaluated laboratory and imaging results, formulated the assessment and plan and placed orders. The Patient requires high complexity decision making with multiple systems involvement.  Rounds were done with the Respiratory Therapy Director and Staff therapists and discussed with nursing staff also.  Yevonne Pax, MD [facility] Pulmonary Critical Care Medicine Sleep Medicine

## 2021-01-03 ENCOUNTER — Other Ambulatory Visit (HOSPITAL_COMMUNITY): Payer: Federal, State, Local not specified - PPO

## 2021-01-03 DIAGNOSIS — J9621 Acute and chronic respiratory failure with hypoxia: Secondary | ICD-10-CM | POA: Diagnosis not present

## 2021-01-03 DIAGNOSIS — U071 COVID-19: Secondary | ICD-10-CM | POA: Diagnosis not present

## 2021-01-03 DIAGNOSIS — I87099 Postthrombotic syndrome with other complications of unspecified lower extremity: Secondary | ICD-10-CM | POA: Diagnosis not present

## 2021-01-03 DIAGNOSIS — J159 Unspecified bacterial pneumonia: Secondary | ICD-10-CM | POA: Diagnosis not present

## 2021-01-03 LAB — CBC
HCT: 25.1 % — ABNORMAL LOW (ref 39.0–52.0)
Hemoglobin: 7.5 g/dL — ABNORMAL LOW (ref 13.0–17.0)
MCH: 29.9 pg (ref 26.0–34.0)
MCHC: 29.9 g/dL — ABNORMAL LOW (ref 30.0–36.0)
MCV: 100 fL (ref 80.0–100.0)
Platelets: 494 10*3/uL — ABNORMAL HIGH (ref 150–400)
RBC: 2.51 MIL/uL — ABNORMAL LOW (ref 4.22–5.81)
RDW: 15.7 % — ABNORMAL HIGH (ref 11.5–15.5)
WBC: 12.5 10*3/uL — ABNORMAL HIGH (ref 4.0–10.5)
nRBC: 0 % (ref 0.0–0.2)

## 2021-01-03 LAB — BASIC METABOLIC PANEL
Anion gap: 9 (ref 5–15)
BUN: 23 mg/dL (ref 8–23)
CO2: 30 mmol/L (ref 22–32)
Calcium: 8.3 mg/dL — ABNORMAL LOW (ref 8.9–10.3)
Chloride: 107 mmol/L (ref 98–111)
Creatinine, Ser: 0.61 mg/dL (ref 0.61–1.24)
GFR, Estimated: >60 mL/min (ref >60)
Glucose, Bld: 146 mg/dL — ABNORMAL HIGH (ref 70–99)
Potassium: 3.8 mmol/L (ref 3.5–5.1)
Sodium: 146 mmol/L — ABNORMAL HIGH (ref 135–145)

## 2021-01-03 LAB — ECHOCARDIOGRAM COMPLETE
AR max vel: 3.41 cm2
AV Area VTI: 3.24 cm2
AV Area mean vel: 2.97 cm2
AV Mean grad: 6 mmHg
AV Peak grad: 11.3 mmHg
Ao pk vel: 1.68 m/s
S' Lateral: 3.4 cm

## 2021-01-03 NOTE — Progress Notes (Signed)
  Echocardiogram 2D Echocardiogram has been performed.  Gerda Diss 01/03/2021, 9:32 AM

## 2021-01-03 NOTE — Progress Notes (Signed)
Pulmonary Critical Care Medicine [facility] [place]   PULMONARY CRITICAL CARE SERVICE  PROGRESS NOTE  Date of Service: 01/03/2021  Scott Roberts  EML:[id number]  DOB: 1956-03-20   DOA: 12/27/2020  Referring Physician: [name], MD  HPI: Scott Roberts is a 65 y.o. adult seen for follow up of Acute on Chronic Respiratory Failure.  Patient remains on pressure support mode we will try to wean patient has been having increased heart rate on the last attempt at the weaning.  This morning was back on pressure support and we will be trying to wean once again.  Medications: Reviewed on Rounds  Physical Exam:  Vitals: Temperature 98.4 pulse 92 respiratory rate 28 blood pressure is 153/84 saturations 99%  Ventilator Settings on pressure support FiO2 40% pressure of 12/7  . General: Comfortable at this time . Eyes: Grossly normal lids, irises & conjunctiva . ENT: grossly tongue is normal . Neck: no obvious mass . Cardiovascular: S1 S2 normal no gallop . Respiratory: No rhonchi very coarse breath sounds . Abdomen: soft . Skin: no rash seen on limited exam . Musculoskeletal: not rigid . Psychiatric:unable to assess . Neurologic: no seizure no involuntary movements         Lab Data:   Basic Metabolic Panel: Recent Labs  Lab 12/29/20 0634 12/30/20 0605 12/30/20 2359 01/01/21 0505 01/02/21 0428 01/03/21 0449  NA 144 144 144 145 146* 146*  K 3.4* 3.5 3.4* 4.0  --  3.8  CL 106 105 109 108  --  107  CO2 27 27 26 28   --  30  GLUCOSE 128* 150* 133* 141*  --  146*  BUN 20 21 19 21   --  23  CREATININE 0.87 0.77 0.66 0.64  --  0.61  CALCIUM 7.8* 7.9* 8.0* 8.4*  --  8.3*  MG 1.5* 1.7 1.8 1.6* 1.8  --   PHOS 2.7 3.1 2.1* 2.5  --   --     ABG: Recent Labs  Lab 12/28/20 0536 12/31/20 1235 12/31/20 1540 01/01/21 0454  PHART 7.429 7.283* 7.378 7.307*  PCO2ART 42.6 64.5* 51.0* 61.7*  PO2ART 54.7* 67.4* 179* 85.5  HCO3 28.0 29.7* 29.4* 30.3*  O2SAT 90.1 92.4 99.6  97.1    Liver Function Tests: Recent Labs  Lab 12/29/20 0634  AST 21  ALT 20  ALKPHOS 82  BILITOT 0.5  PROT 5.7*  ALBUMIN 2.0*   No results for input(s): LIPASE, AMYLASE in the last 168 hours. No results for input(s): AMMONIA in the last 168 hours.  CBC: Recent Labs  Lab 12/30/20 0605 12/30/20 2359 12/31/20 0854 01/01/21 0505 01/03/21 0449  WBC 19.9* 16.4* 16.0* 15.2* 12.5*  HGB 8.0* 7.1* 7.7* 7.6* 7.5*  HCT 25.2* 22.2* 23.7* 25.4* 25.1*  MCV 95.8 95.7 95.2 98.1 100.0  PLT 484* 433* 520* 511* 494*    Cardiac Enzymes: No results for input(s): CKTOTAL, CKMB, CKMBINDEX, TROPONINI in the last 168 hours.  BNP (last 3 results) No results for input(s): BNP in the last 8760 hours.  ProBNP (last 3 results) No results for input(s): PROBNP in the last 8760 hours.  Radiological Exams: DG CHEST PORT 1 VIEW  Result Date: 01/02/2021 CLINICAL DATA:  Atrial fibrillation. EXAM: PORTABLE CHEST 1 VIEW COMPARISON:  Radiograph yesterday. FINDINGS: Tracheostomy tube unchanged in position at the thoracic inlet. Cardiomegaly is not significantly changed. Stable mediastinal contours. Heterogeneous bilateral lung opacities are not significantly changed. Small bilateral pleural effusions. No pneumothorax. IMPRESSION: Unchanged cardiomegaly, bilateral heterogeneous lung opacities  and small pleural effusions. No significant change from prior imaging. Electronically Signed   By: Narda Rutherford M.D.   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 Acute on chronic respiratory failure with hypoxia plan is to continue with the pressure support wean will titrate oxygen as tolerated and continue with aggressive  pulmonary toilet.  Chest x-ray that was done showed cardiomegaly with bilateral opacities small effusions 2. COVID-19 virus infection recovery phase we will continue to follow 3. ARDS treated with slow improvement 4. Healthcare associated pneumonia has been on antibiotics 5. Chronic DVT treated continue to follow along   I have personally seen and evaluated the patient, evaluated laboratory and imaging results, formulated the assessment and plan and placed orders. The Patient requires high complexity decision making with multiple systems involvement.  Rounds were done with the Respiratory Therapy Director and Staff therapists and discussed with nursing staff also.  Yevonne Pax, MD [facility] Pulmonary Critical Care Medicine Sleep Medicine

## 2021-01-04 ENCOUNTER — Other Ambulatory Visit (HOSPITAL_COMMUNITY): Payer: Federal, State, Local not specified - PPO

## 2021-01-04 DIAGNOSIS — U071 COVID-19: Secondary | ICD-10-CM | POA: Diagnosis not present

## 2021-01-04 DIAGNOSIS — I87099 Postthrombotic syndrome with other complications of unspecified lower extremity: Secondary | ICD-10-CM | POA: Diagnosis not present

## 2021-01-04 DIAGNOSIS — J159 Unspecified bacterial pneumonia: Secondary | ICD-10-CM | POA: Diagnosis not present

## 2021-01-04 DIAGNOSIS — J9621 Acute and chronic respiratory failure with hypoxia: Secondary | ICD-10-CM | POA: Diagnosis not present

## 2021-01-04 LAB — BLOOD GAS, ARTERIAL
Acid-Base Excess: 3.8 mmol/L — ABNORMAL HIGH (ref 0.0–2.0)
Acid-Base Excess: 6.6 mmol/L — ABNORMAL HIGH (ref 0.0–2.0)
Bicarbonate: 33.6 mmol/L — ABNORMAL HIGH (ref 20.0–28.0)
Bicarbonate: 32.5 mmol/L — ABNORMAL HIGH (ref 20.0–28.0)
FIO2: 60
FIO2: 60
O2 Saturation: 91.3 %
O2 Saturation: 98.7 %
Patient temperature: 37
Patient temperature: 37
pCO2 arterial: >120 mmHg (ref 32.0–48.0)
pCO2 arterial: 65 mmHg — ABNORMAL HIGH (ref 32.0–48.0)
pH, Arterial: 7.071 — CL (ref 7.350–7.450)
pH, Arterial: 7.32 — ABNORMAL LOW (ref 7.350–7.450)
pO2, Arterial: 74.8 mmHg — ABNORMAL LOW (ref 83.0–108.0)
pO2, Arterial: 114 mmHg — ABNORMAL HIGH (ref 83.0–108.0)

## 2021-01-04 LAB — BASIC METABOLIC PANEL
Anion gap: 9 (ref 5–15)
BUN: 34 mg/dL — ABNORMAL HIGH (ref 8–23)
CO2: 31 mmol/L (ref 22–32)
Calcium: 8.7 mg/dL — ABNORMAL LOW (ref 8.9–10.3)
Chloride: 105 mmol/L (ref 98–111)
Creatinine, Ser: 0.64 mg/dL (ref 0.61–1.24)
GFR, Estimated: >60 mL/min (ref >60)
Glucose, Bld: 162 mg/dL — ABNORMAL HIGH (ref 70–99)
Potassium: 4.1 mmol/L (ref 3.5–5.1)
Sodium: 145 mmol/L (ref 135–145)

## 2021-01-04 LAB — CBC
HCT: 28.5 % — ABNORMAL LOW (ref 39.0–52.0)
Hemoglobin: 8.1 g/dL — ABNORMAL LOW (ref 13.0–17.0)
MCH: 29.7 pg (ref 26.0–34.0)
MCHC: 28.4 g/dL — ABNORMAL LOW (ref 30.0–36.0)
MCV: 104.4 fL — ABNORMAL HIGH (ref 80.0–100.0)
Platelets: 712 10*3/uL — ABNORMAL HIGH (ref 150–400)
RBC: 2.73 MIL/uL — ABNORMAL LOW (ref 4.22–5.81)
RDW: 16.4 % — ABNORMAL HIGH (ref 11.5–15.5)
WBC: 19.9 10*3/uL — ABNORMAL HIGH (ref 4.0–10.5)
nRBC: 0.8 % — ABNORMAL HIGH (ref 0.0–0.2)

## 2021-01-04 LAB — MAGNESIUM: Magnesium: 1.7 mg/dL (ref 1.7–2.4)

## 2021-01-04 LAB — AMMONIA: Ammonia: 62 umol/L — ABNORMAL HIGH (ref 9–35)

## 2021-01-04 NOTE — Progress Notes (Signed)
[facility]   PULMONARY [facility]ITICAL [facility]ARE SERVICE  PROGRESS NOTE  Date of Service: 01/04/2021  Scott Roberts  ZOX:[id number]  DOB: 08-19-1956   DOA: 12/27/2020  Referring Physician: Carron CurieAli Hijazi, MD  HPI: Scott Roberts is a 65 y.o. adult seen for follow up of Acute on Chronic Respiratory Failure.  Patient currently is on the ventilator back on full support has been having increased work of breathing noted ABG results had shown PCO2 of greater than 120 so I did order chest x-ray to be done and patient was placed back on the ventilator  Medications: Reviewed on Rounds  Physical Exam:  Vitals: Temperature 97.7 pulse 83 respiratory rate 23 blood pressure 133/90 saturations 100%  Ventilator Settings currently is on assist control FiO2 60% tidal volume 400 with a PEEP of 7   General: Comfortable at this time  Eyes: Grossly normal lids, irises & conjunctiva  ENT: grossly tongue is normal  Neck: no obvious mass  Cardiovascular: S1 S2 normal no gallop  Respiratory: Scattered rhonchi very coarse breath sounds noted  Abdomen: soft  Skin: no rash seen on limited exam  Musculoskeletal: not rigid  Psychiatric:unable to assess  Neurologic: no seizure no involuntary movements         Lab Data:   Basic Metabolic Panel: Recent Labs  Lab 12/29/20 0634 12/30/20 0605 12/30/20 2359 01/01/21 0505 01/02/21 0428 01/03/21 0449 01/04/21 0700  NA 144 144 144 145 146* 146* 145  K 3.4* 3.5 3.4* 4.0  --  3.8 4.1  CL 106 105 109 108  --  107 105  CO2 27 27 26 28   --  30 31  GLUCOSE 128* 150* 133* 141*  --  146* 162*  BUN 20 21 19 21   --  23 34*  CREATININE 0.87 0.77 0.66 0.64  --  0.61 0.64  CALCIUM 7.8* 7.9* 8.0* 8.4*  --  8.3* 8.7*  MG 1.5* 1.7 1.8 1.6* 1.8  --  1.7  PHOS 2.7 3.1 2.1* 2.5  --   --   --     ABG: Recent Labs  Lab 12/31/20 1235 12/31/20 1540 01/01/21 0454 01/04/21 0730 01/04/21 1015  PHART 7.283* 7.378  7.307* 7.071* 7.320*  PCO2ART 64.5* 51.0* 61.7* >120.0* 65.0*  PO2ART 67.4* 179* 85.5 74.8* 114*  HCO3 29.7* 29.4* 30.3* 33.6* 32.5*  O2SAT 92.4 99.6 97.1 91.3 98.7    Liver Function Tests: Recent Labs  Lab 12/29/20 0634  AST 21  ALT 20  ALKPHOS 82  BILITOT 0.5  PROT 5.7*  ALBUMIN 2.0*   No results for input(s): LIPASE, AMYLASE in the last 168 hours. Recent Labs  Lab 01/04/21 0415  AMMONIA 62*    CBC: Recent Labs  Lab 12/30/20 2359 12/31/20 0854 01/01/21 0505 01/03/21 0449 01/04/21 0700  WBC 16.4* 16.0* 15.2* 12.5* 19.9*  HGB 7.1* 7.7* 7.6* 7.5* 8.1*  HCT 22.2* 23.7* 25.4* 25.1* 28.5*  MCV 95.7 95.2 98.1 100.0 104.4*  PLT 433* 520* 511* 494* 712*    Cardiac Enzymes: No results for input(s): CKTOTAL, CKMB, CKMBINDEX, TROPONINI in the last 168 hours.  BNP (last 3 results) No results for input(s): BNP in the last 8760 hours.  ProBNP (last 3 results) No results for input(s): PROBNP in the last 8760 hours.  Radiological Exams: DG CHEST PORT 1 VIEW  Result Date: 01/04/2021 CLINICAL DATA:  Increased oxygen demand. EXAM: PORTABLE CHEST 1 VIEW COMPARISON:  Chest x-ray 01/02/2021, 01/01/2021. FINDINGS: Tracheostomy noted in stable  position. Stable cardiomegaly. No pulmonary venous congestion. Diffuse dense bilateral pulmonary infiltrates. No significant interim change. Small bilateral pleural effusions can not be excluded. No pneumothorax. No acute bony abnormality. IMPRESSION: 1. Tracheostomy tube in stable position. Diffuse dense bilateral pulmonary infiltrates/edema again noted. No significant interim change. 2.  Stable cardiomegaly.  No pulmonary venous congestion. Electronically Signed   By: Maisie Fus  Register   On: 01/04/2021 07:50   ECHOCARDIOGRAM COMPLETE  Result Date: 01/03/2021    ECHOCARDIOGRAM REPORT   Patient Name:   Scott Roberts Date of Exam: 01/03/2021 Medical Rec #:  [id number]      Height: Accession #:    [id number]     Weight: Date of Birth:  19-Feb-1956       BSA: Patient Age:    64 years       BP:           131/76 mmHg Patient Gender: M              HR:           84 bpm. Exam Location:  Inpatient Procedure: 2D Echo, Cardiac Doppler and Color Doppler Indications:     Atrial fibrillation  History:         Patient has no prior history of Echocardiogram examinations.  Sonographer:     Ross Ludwig RDCS (AE) Referring Phys:  [id number] Florian Buff BROWN Diagnosing Phys: Orpah Cobb MD  Sonographer Comments: Echo performed with patient supine and on artificial respirator. IMPRESSIONS  1. Left ventricular ejection fraction, by estimation, is 50 to 55%. The left ventricle has low normal function. The left ventricle demonstrates global hypokinesis. There is mild concentric left ventricular hypertrophy. Left ventricular diastolic parameters are indeterminate.  2. Right ventricular systolic function is normal. The right ventricular size is normal. There is moderately elevated pulmonary artery systolic pressure.  3. Left atrial size was moderately dilated.  4. Right atrial size was mildly dilated.  5. The mitral valve is grossly normal. Moderate mitral valve regurgitation. No evidence of mitral stenosis.  6. The aortic valve is tricuspid. Aortic valve regurgitation is not visualized. Mild aortic valve sclerosis is present, with no evidence of aortic valve stenosis.  7. The inferior vena cava is dilated in size with <50% respiratory variability, suggesting right atrial pressure of 15 mmHg. FINDINGS  Left Ventricle: Left ventricular ejection fraction, by estimation, is 50 to 55%. The left ventricle has low normal function. The left ventricle demonstrates global hypokinesis. The left ventricular internal cavity size was normal in size. There is mild concentric left ventricular hypertrophy. Left ventricular diastolic parameters are indeterminate. Right Ventricle: The right ventricular size is normal. No increase in right ventricular wall thickness. Right ventricular systolic  function is normal. There is moderately elevated pulmonary artery systolic pressure. Left Atrium: Left atrial size was moderately dilated. Right Atrium: Right atrial size was mildly dilated. Pericardium: There is no evidence of pericardial effusion. Mitral Valve: The mitral valve is grossly normal. There is mild thickening of the mitral valve leaflet(s). There is mild calcification of the mitral valve leaflet(s). Mild mitral annular calcification. Moderate mitral valve regurgitation. No evidence of mitral valve stenosis. Tricuspid Valve: The tricuspid valve is normal in structure. Tricuspid valve regurgitation is mild. Aortic Valve: The aortic valve is tricuspid. Aortic valve regurgitation is not visualized. Mild aortic valve sclerosis is present, with no evidence of aortic valve stenosis. Aortic valve mean gradient measures 6.0 mmHg. Aortic valve peak gradient measures 11.3 mmHg. Aortic valve area, by VTI  measures 3.24 cm. Pulmonic Valve: The pulmonic valve was grossly normal. Pulmonic valve regurgitation is not visualized. Aorta: The aortic root is normal in size and structure. There is minimal (Grade I) atheroma plaque involving the aortic root and ascending aorta. Venous: The inferior vena cava is dilated in size with less than 50% respiratory variability, suggesting right atrial pressure of 15 mmHg. IAS/Shunts: The interatrial septum was not well visualized.  LEFT VENTRICLE PLAX 2D LVIDd:         4.30 cm LVIDs:         3.40 cm LV PW:         1.40 cm LV IVS:        1.70 cm LVOT diam:     2.30 cm LV SV:         111 LVOT Area:     4.15 cm  RIGHT VENTRICLE             IVC RV Basal diam:  4.20 cm     IVC diam: 2.30 cm RV Mid diam:    3.30 cm RV S prime:     14.80 cm/s TAPSE (M-mode): 2.6 cm LEFT ATRIUM              RIGHT ATRIUM LA diam:        4.20 cm  RA Area:     21.00 cm LA Vol (A2C):   109.0 ml RA Volume:   64.90 ml LA Vol (A4C):   85.8 ml LA Biplane Vol: 100.0 ml  AORTIC VALVE AV Area (Vmax):    3.41 cm AV  Area (Vmean):   2.97 cm AV Area (VTI):     3.24 cm AV Vmax:           168.00 cm/s AV Vmean:          112.000 cm/s AV VTI:            0.341 m AV Peak Grad:      11.3 mmHg AV Mean Grad:      6.0 mmHg LVOT Vmax:         138.00 cm/s LVOT Vmean:        80.000 cm/s LVOT VTI:          0.266 m LVOT/AV VTI ratio: 0.78  AORTA Ao Root diam: 3.60 cm Ao Asc diam:  3.30 cm TRICUSPID VALVE TR Peak grad:   53.9 mmHg TR Vmax:        367.00 cm/s  SHUNTS Systemic VTI:  0.27 m Systemic Diam: 2.30 cm Orpah Cobb MD Electronically signed by Orpah Cobb MD Signature Date/Time: 01/03/2021/2:15:55 PM    Final     Assessment/Plan Active Problems:   Acute on chronic respiratory failure with hypoxia (HCC)   COVID-19 virus infection   Acute respiratory distress syndrome (ARDS) due to COVID-19 virus [facility])   Healthcare associated bacterial pneumonia   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 Acute on chronic respiratory failure with hypoxia patient is back on the ventilator because of severe hypercapnia.  We will do a follow-up chest x-ray in addition to that the patient also had a echocardiogram done results of which are noted above.  Initial evaluation suggest there may be a new infection brewing with an elevated white count of 19.9. 2. 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 COVID-19 virus infection in recovery 4. Healthcare associated pneumonia there is concern for new pneumonia we will do  follow-up ABG and also get a follow-up chest x-ray done. 5. ARDS has been treated we will now exhibiting ARDS physiology once again will adjust the mechanical ventilation accordingly.   I have personally seen and evaluated the patient, evaluated laboratory and imaging results, formulated the assessment and plan and placed orders. The Patient requires high complexity decision making with multiple systems involvement.  Rounds were done with the  Respiratory Therapy Director and Staff therapists and discussed with nursing staff also.  Yevonne Pax, MD [facility] Pulmonary Critical Care Medicine Sleep Medicine

## 2021-01-04 NOTE — Consult Note (Signed)
Infectious Disease Consultation   Scott Roberts  SLH:[id number]  DOB: 1956-03-11  DOA: 12/27/2020  Requesting physician: Dr. Sharyon Medicus  Reason for consultation: Antibiotic recommendations   History of Present Illness: Scott Roberts is an 65 y.o. male who presented to the emergency department with worsening shortness of breath and respiratory distress along with hypoxemia.  He was diagnosed with acute respiratory failure secondary to COVID-19 infection with pneumonia.  He was to [facility] on 11/21/2020.  He did not qualify for remdesivir secondary to high oxygen requirements.  He was treated with steroids and was also given interleukin-6 inhibitor on 11/21/2020.  Unfortunately he had progressively worsening respiratory failure and had to be intubated on 11/21/2020.  He was extubated on 12/01/2020.  However, his respiratory status worsened and he had to be reintubated on 02/03/2020.  He was unable to be weaned from the vent therefore received tracheostomy on 12/06/2020.  His hospital course complicated by bilateral lower extremity DVTs, small pulmonary embolism thought to be associated with COVID-19 infection.  His hospital course also complicated by acute kidney injury requiring hemodialysis.  Eventually he had some renal recovery and dialysis was discontinued.  He also had healthcare associated pneumonia.  His sputum cultures from 12/12/2020 showed Pseudomonas aeruginosa.  He was initially treated with IV vancomycin, meropenem and later changed to IV cefepime.  He had thrombocytopenia, exact etiology was unclear but there was concern for possible beta-lactam induced thrombocytopenia therefore switched to ciprofloxacin.  He completed treatment with this.  However, he continued to have copious secretions.  Respiratory culture from 12/26/2020 showed moderate growth gram-negative rods and aztreonam was started.  He also had anemia thought to be secondary to inflammation and he  received 1 unit PRBC on 12/14/2020 and 1 unit PRBC on 12/24/2020.  Patient also had encephalopathy thought to be secondary to COVID-19 infection.  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 His respiratory cultures from here showed moderate WBC on the gram stain and cultures Pseudomonas aeruginosa.  He is currently on 45% FiO2, 7 of PEEP.  Review of Systems:  Unable to obtain at this time  Past Medical History: Unable to obtain at this time  Past Surgical History: Tracheostomy, PEG tube placement.  Unable to obtain other surgical history.  Allergies: No known drug allergies  Social History: No mention of any tobacco, alcohol or drug abuse history  Family History: Unable to obtain at this time   Physical Exam: Vitals: Temperature 98.1, pulse 90, respiratory rate 27, blood pressure 131/74, pulse oximetry 97% Constitutional: Ill-appearing male, not following any commands at this time. Head: Atraumatic, normocephalic Eyes: PERLA ENMT: Dry oral mucosa with thick secretions, no neuronal lesions Neck: Trach in place CVS: S1-S2 Respiratory: Coarse breath sounds, rhonchi Abdomen: soft, positive bowel sounds  Musculoskeletal: Upper extremity edema, mild lower extremity edema Neuro: He is not following any commands at this time.  Unable to do a neurologic exam at this time. Psych: Unable to assess at this time Skin: no rashes  Data reviewed:  I have personally reviewed following labs and imaging studies Labs:  CBC: Recent Labs  Lab 12/30/20 2359 12/31/20 0854 01/01/21 0505 01/03/21 0449 01/04/21 0700  WBC 16.4* 16.0* 15.2* 12.5* 19.9*  HGB 7.1* 7.7* 7.6* 7.5* 8.1*  HCT 22.2* 23.7* 25.4* 25.1* 28.5*  MCV 95.7 95.2 98.1 100.0 104.4*  PLT 433* 520* 511* 494* 712*    Basic  Metabolic Panel: Recent Labs  Lab 12/29/20 0634 12/30/20 0605 12/30/20 2359 01/01/21 0505 01/02/21 0428 01/03/21 0449 01/04/21 0700  NA 144 144 144 145 146* 146* 145  K  3.4* 3.5 3.4* 4.0  --  3.8 4.1  CL 106 105 109 108  --  107 105  CO2 27 27 26 28   --  30 31  GLUCOSE 128* 150* 133* 141*  --  146* 162*  BUN 20 21 19 21   --  23 34*  CREATININE 0.87 0.77 0.66 0.64  --  0.61 0.64  CALCIUM 7.8* 7.9* 8.0* 8.4*  --  8.3* 8.7*  MG 1.5* 1.7 1.8 1.6* 1.8  --  1.7  PHOS 2.7 3.1 2.1* 2.5  --   --   --    GFR CrCl cannot be calculated (Unknown ideal weight.). Liver Function Tests: Recent Labs  Lab 12/29/20 0634  AST 21  ALT 20  ALKPHOS 82  BILITOT 0.5  PROT 5.7*  ALBUMIN 2.0*   No results for input(s): LIPASE, AMYLASE in the last 168 hours. Recent Labs  Lab 01/04/21 0415  AMMONIA 62*   Coagulation profile No results for input(s): INR, PROTIME in the last 168 hours.  Cardiac Enzymes: No results for input(s): CKTOTAL, CKMB, CKMBINDEX, TROPONINI in the last 168 hours. BNP: Invalid input(s): POCBNP CBG: No results for input(s): GLUCAP in the last 168 hours. D-Dimer No results for input(s): DDIMER in the last 72 hours. Hgb A1c No results for input(s): HGBA1C in the last 72 hours. Lipid Profile No results for input(s): CHOL, HDL, LDLCALC, TRIG, CHOLHDL, LDLDIRECT in the last 72 hours. Thyroid function studies No results for input(s): TSH, T4TOTAL, T3FREE, THYROIDAB in the last 72 hours.  Invalid input(s): FREET3 Anemia work up No results for input(s): VITAMINB12, FOLATE, FERRITIN, TIBC, IRON, RETICCTPCT in the last 72 hours. Urinalysis    Component Value Date/Time   COLORURINE YELLOW 12/28/2020 1934   APPEARANCEUR HAZY (A) 12/28/2020 1934   LABSPEC 1.027 12/28/2020 1934   PHURINE 6.0 12/28/2020 1934   GLUCOSEU NEGATIVE 12/28/2020 1934   HGBUR MODERATE (A) 12/28/2020 1934   BILIRUBINUR NEGATIVE 12/28/2020 1934   KETONESUR NEGATIVE 12/28/2020 1934   PROTEINUR NEGATIVE 12/28/2020 1934   NITRITE NEGATIVE 12/28/2020 1934   LEUKOCYTESUR MODERATE (A) 12/28/2020 1934     Sepsis Labs Invalid input(s): PROCALCITONIN,  WBC,   LACTICIDVEN Microbiology Recent Results (from the past 240 hour(s))  Culture, Urine     Status: Abnormal   Collection Time: 12/28/20 10:04 AM   Specimen: Urine, Random  Result Value Ref Range Status   Specimen Description URINE, RANDOM  Final   Special Requests NONE  Final   Culture (A)  Final    <10,000 COLONIES/mL INSIGNIFICANT GROWTH Performed at [facility], [address], [place], [address] [place]    Report Status 12/30/2020 FINAL  Final  Culture, respiratory (non-expectorated)     Status: None   Collection Time: 12/28/20 11:45 AM   Specimen: Tracheal Aspirate; Respiratory  Result Value Ref Range Status   Specimen Description TRACHEAL ASPIRATE  Final   Special Requests NONE  Final   Gram Stain   Final    RARE WBC PRESENT, PREDOMINANTLY PMN NO ORGANISMS SEEN Performed at [facility], [address] [place], [address] [place]    Culture RARE PSEUDOMONAS AERUGINOSA  Final   Report Status 12/31/2020 FINAL  Final   Organism ID, Bacteria PSEUDOMONAS AERUGINOSA  Final      Susceptibility  Pseudomonas aeruginosa - MIC*    CEFTAZIDIME 8 SENSITIVE Sensitive     CIPROFLOXACIN 1 SENSITIVE Sensitive     GENTAMICIN <=1 SENSITIVE Sensitive     IMIPENEM <=0.25 SENSITIVE Sensitive     * RARE PSEUDOMONAS AERUGINOSA  Culture, respiratory (non-expectorated)     Status: None (Preliminary result)   Collection Time: 01/04/21 10:18 AM   Specimen: Tracheal Aspirate; Respiratory  Result Value Ref Range Status   Specimen Description TRACHEAL ASPIRATE  Final   Special Requests NONE  Final   Gram Stain   Final    MODERATE WBC PRESENT,BOTH PMN AND MONONUCLEAR RARE SQUAMOUS EPITHELIAL CELLS PRESENT MODERATE GRAM POSITIVE COCCI MODERATE GRAM NEGATIVE RODS Performed at [facility], [address] [place], [place] [address]    Culture PENDING  Incomplete   Report Status PENDING  Incomplete     Inpatient Medications:   Please see MAR   Radiological Exams on  Admission: DG CHEST PORT 1 VIEW  Result Date: 01/04/2021 CLINICAL DATA:  Increased oxygen demand. EXAM: PORTABLE CHEST 1 VIEW COMPARISON:  Chest x-ray 01/02/2021, 01/01/2021. FINDINGS: Tracheostomy noted in stable position. Stable cardiomegaly. No pulmonary venous congestion. Diffuse dense bilateral pulmonary infiltrates. No significant interim change. Small bilateral pleural effusions can not be excluded. No pneumothorax. No acute bony abnormality. IMPRESSION: 1. Tracheostomy tube in stable position. Diffuse dense bilateral pulmonary infiltrates/edema again noted. No significant interim change. 2.  Stable cardiomegaly.  No pulmonary venous congestion. Electronically Signed   By: Maisie Fus  Register   On: 01/04/2021 07:50   ECHOCARDIOGRAM COMPLETE  Result Date: 01/03/2021    ECHOCARDIOGRAM REPORT   Patient Name:   ARTAVIS COWIE Date of Exam: 01/03/2021 Medical Rec #:  [id number]      Height: Accession #:    [id number]     Weight: Date of Birth:  [birth year]      BSA: Patient Age:    64 years       BP:           131/76 mmHg Patient Gender: M              HR:           84 bpm. Exam Location:  Inpatient Procedure: 2D Echo, Cardiac Doppler and Color Doppler Indications:     Atrial fibrillation  History:         Patient has no prior history of Echocardiogram examinations.  Sonographer:     Ross Ludwig RDCS (AE) Referring Phys:  [id number] Florian Buff BROWN Diagnosing Phys: Orpah Cobb MD  Sonographer Comments: Echo performed with patient supine and on artificial respirator. IMPRESSIONS  1. Left ventricular ejection fraction, by estimation, is 50 to 55%. The left ventricle has low normal function. The left ventricle demonstrates global hypokinesis. There is mild concentric left ventricular hypertrophy. Left ventricular diastolic parameters are indeterminate.  2. Right ventricular systolic function is normal. The right ventricular size is normal. There is moderately elevated pulmonary artery systolic pressure.  3. Left  atrial size was moderately dilated.  4. Right atrial size was mildly dilated.  5. The mitral valve is grossly normal. Moderate mitral valve regurgitation. No evidence of mitral stenosis.  6. The aortic valve is tricuspid. Aortic valve regurgitation is not visualized. Mild aortic valve sclerosis is present, with no evidence of aortic valve stenosis.  7. The inferior vena cava is dilated in size with <50% respiratory variability, suggesting right atrial pressure of 15 mmHg. FINDINGS  Left Ventricle: Left ventricular ejection fraction, by  estimation, is 50 to 55%. The left ventricle has low normal function. The left ventricle demonstrates global hypokinesis. The left ventricular internal cavity size was normal in size. There is mild concentric left ventricular hypertrophy. Left ventricular diastolic parameters are indeterminate. Right Ventricle: The right ventricular size is normal. No increase in right ventricular wall thickness. Right ventricular systolic function is normal. There is moderately elevated pulmonary artery systolic pressure. Left Atrium: Left atrial size was moderately dilated. Right Atrium: Right atrial size was mildly dilated. Pericardium: There is no evidence of pericardial effusion. Mitral Valve: The mitral valve is grossly normal. There is mild thickening of the mitral valve leaflet(s). There is mild calcification of the mitral valve leaflet(s). Mild mitral annular calcification. Moderate mitral valve regurgitation. No evidence of mitral valve stenosis. Tricuspid Valve: The tricuspid valve is normal in structure. Tricuspid valve regurgitation is mild. Aortic Valve: The aortic valve is tricuspid. Aortic valve regurgitation is not visualized. Mild aortic valve sclerosis is present, with no evidence of aortic valve stenosis. Aortic valve mean gradient measures 6.0 mmHg. Aortic valve peak gradient measures 11.3 mmHg. Aortic valve area, by VTI measures 3.24 cm. Pulmonic Valve: The pulmonic valve was  grossly normal. Pulmonic valve regurgitation is not visualized. Aorta: The aortic root is normal in size and structure. There is minimal (Grade I) atheroma plaque involving the aortic root and ascending aorta. Venous: The inferior vena cava is dilated in size with less than 50% respiratory variability, suggesting right atrial pressure of 15 mmHg. IAS/Shunts: The interatrial septum was not well visualized.  LEFT VENTRICLE PLAX 2D LVIDd:         4.30 cm LVIDs:         3.40 cm LV PW:         1.40 cm LV IVS:        1.70 cm LVOT diam:     2.30 cm LV SV:         111 LVOT Area:     4.15 cm  RIGHT VENTRICLE             IVC RV Basal diam:  4.20 cm     IVC diam: 2.30 cm RV Mid diam:    3.30 cm RV S prime:     14.80 cm/s TAPSE (M-mode): 2.6 cm LEFT ATRIUM              RIGHT ATRIUM LA diam:        4.20 cm  RA Area:     21.00 cm LA Vol (A2C):   109.0 ml RA Volume:   64.90 ml LA Vol (A4C):   85.8 ml LA Biplane Vol: 100.0 ml  AORTIC VALVE AV Area (Vmax):    3.41 cm AV Area (Vmean):   2.97 cm AV Area (VTI):     3.24 cm AV Vmax:           168.00 cm/s AV Vmean:          112.000 cm/s AV VTI:            0.341 m AV Peak Grad:      11.3 mmHg AV Mean Grad:      6.0 mmHg LVOT Vmax:         138.00 cm/s LVOT Vmean:        80.000 cm/s LVOT VTI:          0.266 m LVOT/AV VTI ratio: 0.78  AORTA Ao Root diam: 3.60 cm Ao Asc diam:  3.30 cm TRICUSPID VALVE TR  Peak grad:   53.9 mmHg TR Vmax:        367.00 cm/s  SHUNTS Systemic VTI:  0.27 m Systemic Diam: 2.30 cm Orpah CobbAjay Kadakia MD Electronically signed by Orpah CobbAjay Kadakia MD Signature Date/Time: 01/03/2021/2:15:55 PM    Final     Impression/Recommendations Acute hypoxemic respiratory failure, ventilator dependent Pneumonia with Pseudomonas COVID-19 infection Leukocytosis Encephalopathy Bilateral lower extremity DVT Atrial fibrillation with RVR Acute kidney injury Protein-calorie malnutrition Dysphagia  Acute hypoxemic respiratory failure: Patient remains ventilator dependent.  He is on  45% FiO2, PEEP of 7.  He initially had COVID-19 infection with pneumonia.  Subsequent bacterial pneumonia.  His respiratory cultures at the outside facility showed Pseudomonas aeruginosa.  He has received treatment with multiple antibiotics including IV vancomycin, meropenem, cefepime, ciprofloxacin at the acute facility.  After that he was subsequently switched to Azactam.  However, now having worsening leukocytosis worsening respiratory status.  Chest x-ray per report diffuse dense bilateral pulmonary infiltrates.  Patient also had mucous plugging and required suctioning apparently he had continue pulmonary toileting.  Suggest chest CT to better evaluate.  So far he has received almost 7-10 days of Azactam but not much improvement.  Therefore will discontinue and switch him to meropenem.  Please monitor CBC, BMP closely while on antibiotics.  We will plan to treat for duration of 7-10 days pending improvement.  Pulmonary following.  Pneumonia: He had secondary bacterial pneumonia with Pseudomonas at the acute facility for which he was already treated.  Now having worsening respiratory status.  Currently on aztreonam.  We will switch to meropenem due to worsening leukocytosis.  He remains on the ventilator at this time.  Chest x-ray findings as mentioned above.  Suggest chest CT to better evaluate.  He also has dysphagia and at risk for aspiration and recurrent aspiration pneumonia despite being on antibiotics.  -He had thrombocytopenia at the acute facility the exact etiology for which was unclear for which reason beta-lactam's were discontinued although low suspicion.  Now having thrombocytosis likely reactive from the infection/inflammation.  Please monitor counts closely while on antibiotics.  COVID-19 infection: Patient was treated for this at the outside facility.  He received IL-6 inhibitor.  He is high risk for ARDS from the COVID-19 infection.  He is status post Decadron.  He started on hydroxyurea,  folic acid, fish oil.  Continue supportive management per the primary team.  Leukocytosis: Likely secondary to the pneumonia.  Antibiotics and plan as mentioned above.  If he starts having any worsening fevers, worsening WBC count suggest pancultures, CT of the abdomen and pelvis as well of the chest is unrevealing.  Monitor counts closely.  Encephalopathy: He is not following any commands for me at this time.  Continue medications and supportive management by the primary team.  Bilateral lower extremity DVT: He is status post heparin drip currently on Lovenox.  Further management per the primary team.  Patient also had anemia and required blood transfusion at the outside facility.  Continue to monitor closely.  Atrial fibrillation with RVR: Cardiology consulted.  Echocardiogram pending.  Continue medications and management per the primary team and cardiology.  Acute kidney injury: Patient was on dialysis at the acute facility.  At this time renal function appears to be stable.  Please monitor BUN/creatinine closely while on the antibiotics.  Avoid nephrotoxic medications.  Further management per the primary team.  Protein-calorie malnutrition/dysphagia: Management per primary team.  Due to his dysphagia he is high risk for aspiration and recurrent aspiration pneumonia  despite being on antibiotics.  Due to his complex medical problems he is high risk for worsening and decompensation.  Thank you for this consultation.  Plan of care discussed with the primary team and pharmacy.  Vonzella NippleAnupama Marlyn Rabine M.D. 01/04/2021, 3:51 PM

## 2021-01-05 ENCOUNTER — Encounter: Payer: Self-pay | Admitting: Internal Medicine

## 2021-01-05 DIAGNOSIS — I87099 Postthrombotic syndrome with other complications of unspecified lower extremity: Secondary | ICD-10-CM | POA: Diagnosis present

## 2021-01-05 DIAGNOSIS — J159 Unspecified bacterial pneumonia: Secondary | ICD-10-CM | POA: Diagnosis not present

## 2021-01-05 DIAGNOSIS — J9621 Acute and chronic respiratory failure with hypoxia: Secondary | ICD-10-CM | POA: Diagnosis not present

## 2021-01-05 DIAGNOSIS — J80 Acute respiratory distress syndrome: Secondary | ICD-10-CM | POA: Diagnosis present

## 2021-01-05 DIAGNOSIS — U071 COVID-19: Secondary | ICD-10-CM | POA: Diagnosis present

## 2021-01-05 LAB — URINALYSIS, ROUTINE W REFLEX MICROSCOPIC
Bilirubin Urine: NEGATIVE
Glucose, UA: NEGATIVE mg/dL
Ketones, ur: NEGATIVE mg/dL
Nitrite: NEGATIVE
Protein, ur: NEGATIVE mg/dL
Specific Gravity, Urine: 1.015 (ref 1.005–1.030)
pH: 5 (ref 5.0–8.0)

## 2021-01-05 LAB — BLOOD GAS, ARTERIAL
Acid-Base Excess: 6.8 mmol/L — ABNORMAL HIGH (ref 0.0–2.0)
Bicarbonate: 32.2 mmol/L — ABNORMAL HIGH (ref 20.0–28.0)
FIO2: 45
O2 Saturation: 98.9 %
Patient temperature: 37
pCO2 arterial: 59.4 mmHg — ABNORMAL HIGH (ref 32.0–48.0)
pH, Arterial: 7.354 (ref 7.350–7.450)
pO2, Arterial: 128 mmHg — ABNORMAL HIGH (ref 83.0–108.0)

## 2021-01-05 LAB — MAGNESIUM: Magnesium: 1.8 mg/dL (ref 1.7–2.4)

## 2021-01-05 NOTE — Progress Notes (Signed)
Pulmonary Critical Care Medicine [facility] [place]   PULMONARY CRITICAL CARE SERVICE  PROGRESS NOTE  Date of Service: 01/05/2021  Scott Roberts  EHO:[id number]  DOB: December 27, 1955   DOA: 12/27/2020  Referring Physician: [name], MD  HPI: Scott Roberts is a 65 y.o. adult seen for follow up of Acute on Chronic Respiratory Failure.  Patient is on assist control mode has been on 45% FiO2 with a PEEP of nine  Medications: Reviewed on Rounds  Physical Exam:  Vitals: Temperature is 98.1 pulse 84 respiratory rate 20 blood pressure is 118/71 saturations 100%  Ventilator Settings currently on assist control FiO2 45% PEEP of nine tidal volumes 400  . General: Comfortable at this time . Eyes: Grossly normal lids, irises & conjunctiva . ENT: grossly tongue is normal . Neck: no obvious mass . Cardiovascular: S1 S2 normal no gallop . Respiratory: Scattered rhonchi expansion is equal at this time . Abdomen: soft . Skin: no rash seen on limited exam . Musculoskeletal: not rigid . Psychiatric:unable to assess . Neurologic: no seizure no involuntary movements         Lab Data:   Basic Metabolic Panel: Recent Labs  Lab 12/30/20 0605 12/30/20 2359 01/01/21 0505 01/02/21 0428 01/03/21 0449 01/04/21 0700 01/05/21 0710  NA 144 144 145 146* 146* 145  --   K 3.5 3.4* 4.0  --  3.8 4.1  --   CL 105 109 108  --  107 105  --   CO2 27 26 28   --  30 31  --   GLUCOSE 150* 133* 141*  --  146* 162*  --   BUN 21 19 21   --  23 34*  --   CREATININE 0.77 0.66 0.64  --  0.61 0.64  --   CALCIUM 7.9* 8.0* 8.4*  --  8.3* 8.7*  --   MG 1.7 1.8 1.6* 1.8  --  1.7 1.8  PHOS 3.1 2.1* 2.5  --   --   --   --     ABG: Recent Labs  Lab 12/31/20 1540 01/01/21 0454 01/04/21 0730 01/04/21 1015 01/05/21 0640  PHART 7.378 7.307* 7.071* 7.320* 7.354  PCO2ART 51.0* 61.7* >120.0* 65.0* 59.4*  PO2ART 179* 85.5 74.8* 114* 128*  HCO3 29.4* 30.3* 33.6* 32.5* 32.2*  O2SAT 99.6 97.1 91.3 98.7  98.9    Liver Function Tests: No results for input(s): AST, ALT, ALKPHOS, BILITOT, PROT, ALBUMIN in the last 168 hours. No results for input(s): LIPASE, AMYLASE in the last 168 hours. Recent Labs  Lab 01/04/21 0415  AMMONIA 62*    CBC: Recent Labs  Lab 12/30/20 2359 12/31/20 0854 01/01/21 0505 01/03/21 0449 01/04/21 0700  WBC 16.4* 16.0* 15.2* 12.5* 19.9*  HGB 7.1* 7.7* 7.6* 7.5* 8.1*  HCT 22.2* 23.7* 25.4* 25.1* 28.5*  MCV 95.7 95.2 98.1 100.0 104.4*  PLT 433* 520* 511* 494* 712*    Cardiac Enzymes: No results for input(s): CKTOTAL, CKMB, CKMBINDEX, TROPONINI in the last 168 hours.  BNP (last 3 results) No results for input(s): BNP in the last 8760 hours.  ProBNP (last 3 results) No results for input(s): PROBNP in the last 8760 hours.  Radiological Exams: DG CHEST PORT 1 VIEW  Result Date: 01/04/2021 CLINICAL DATA:  Increased oxygen demand. EXAM: PORTABLE CHEST 1 VIEW COMPARISON:  Chest x-ray 01/02/2021, 01/01/2021. FINDINGS: Tracheostomy noted in stable position. Stable cardiomegaly. No pulmonary venous congestion. Diffuse dense bilateral pulmonary infiltrates. No significant interim change. Small bilateral pleural effusions can  not be excluded. No pneumothorax. No acute bony abnormality. IMPRESSION: 1. Tracheostomy tube in stable position. Diffuse dense bilateral pulmonary infiltrates/edema again noted. No significant interim change. 2.  Stable cardiomegaly.  No pulmonary venous congestion. 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 Acute on chronic respiratory failure hypoxia we will continue with assist control titrate oxygen continue  pulmonary toilet. 2. COVID-19 virus infection recovery phase 3. ARDS treated slowly improving 4. Healthcare associated pneumonia treated 5. 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 The Patient requires high complexity decision making with multiple systems involvement.  Rounds were done with the Respiratory Therapy Director and Staff therapists and discussed with nursing staff also.  Yevonne Pax, MD [facility] Pulmonary Critical Care Medicine Sleep Medicine

## 2021-01-06 DIAGNOSIS — J9621 Acute and chronic respiratory failure with hypoxia: Secondary | ICD-10-CM | POA: Diagnosis not present

## 2021-01-06 DIAGNOSIS — I87099 Postthrombotic syndrome with other complications of unspecified lower extremity: Secondary | ICD-10-CM | POA: Diagnosis not present

## 2021-01-06 DIAGNOSIS — J159 Unspecified bacterial pneumonia: Secondary | ICD-10-CM | POA: Diagnosis not present

## 2021-01-06 DIAGNOSIS — U071 COVID-19: Secondary | ICD-10-CM | POA: Diagnosis not present

## 2021-01-06 LAB — CBC
HCT: 21.8 % — ABNORMAL LOW (ref 39.0–52.0)
Hemoglobin: 6.8 g/dL — CL (ref 13.0–17.0)
MCH: 30.9 pg (ref 26.0–34.0)
MCHC: 31.2 g/dL (ref 30.0–36.0)
MCV: 99.1 fL (ref 80.0–100.0)
Platelets: 502 10*3/uL — ABNORMAL HIGH (ref 150–400)
RBC: 2.2 MIL/uL — ABNORMAL LOW (ref 4.22–5.81)
RDW: 16.8 % — ABNORMAL HIGH (ref 11.5–15.5)
WBC: 10.5 10*3/uL (ref 4.0–10.5)
nRBC: 0.7 % — ABNORMAL HIGH (ref 0.0–0.2)

## 2021-01-06 LAB — CULTURE, RESPIRATORY W GRAM STAIN

## 2021-01-06 LAB — BASIC METABOLIC PANEL
Anion gap: 8 (ref 5–15)
BUN: 31 mg/dL — ABNORMAL HIGH (ref 8–23)
CO2: 33 mmol/L — ABNORMAL HIGH (ref 22–32)
Calcium: 8.8 mg/dL — ABNORMAL LOW (ref 8.9–10.3)
Chloride: 105 mmol/L (ref 98–111)
Creatinine, Ser: 0.59 mg/dL — ABNORMAL LOW (ref 0.61–1.24)
GFR, Estimated: >60 mL/min (ref >60)
Glucose, Bld: 122 mg/dL — ABNORMAL HIGH (ref 70–99)
Potassium: 3.4 mmol/L — ABNORMAL LOW (ref 3.5–5.1)
Sodium: 146 mmol/L — ABNORMAL HIGH (ref 135–145)

## 2021-01-06 LAB — PREPARE RBC (CROSSMATCH)

## 2021-01-06 LAB — ABO/RH: ABO/RH(D): B POS

## 2021-01-06 LAB — OCCULT BLOOD X 1 CARD TO LAB, STOOL: Fecal Occult Bld: NEGATIVE

## 2021-01-06 NOTE — Progress Notes (Signed)
Pulmonary Critical Care Medicine [facility] [place]   PULMONARY CRITICAL CARE SERVICE  PROGRESS NOTE  Date of Service: 01/06/2021  Scott Roberts  QIO:[id number]  DOB: 03-13-1956   DOA: 12/27/2020  Referring Physician: [name], MD  HPI: Scott Roberts is a 65 y.o. adult seen for follow up of Acute on Chronic Respiratory Failure.  Patient is on assist control mode currently on 40% FiO2 good saturations are noted.  Medications: Reviewed on Rounds  Physical Exam:  Vitals: Temperature is 98.2 pulse 73 respiratory 29 blood pressure is 153/86 saturations 97  Ventilator Settings on assist control FiO2 40% tidal volume 400 PEEP of 9   General: Comfortable at this time  Eyes: Grossly normal lids, irises & conjunctiva  ENT: grossly tongue is normal  Neck: no obvious mass  Cardiovascular: S1 S2 normal no gallop  Respiratory: No rhonchi no rales  Abdomen: soft  Skin: no rash seen on limited exam  Musculoskeletal: not rigid  Psychiatric:unable to assess  Neurologic: no seizure no involuntary movements         Lab Data:   Basic Metabolic Panel: Recent Labs  Lab 12/30/20 2359 01/01/21 0505 01/02/21 0428 01/03/21 0449 01/04/21 0700 01/05/21 0710 01/06/21 0439  NA 144 145 146* 146* 145  --  146*  K 3.4* 4.0  --  3.8 4.1  --  3.4*  CL 109 108  --  107 105  --  105  CO2 26 28  --  30 31  --  33*  GLUCOSE 133* 141*  --  146* 162*  --  122*  BUN 19 21  --  23 34*  --  31*  CREATININE 0.66 0.64  --  0.61 0.64  --  0.59*  CALCIUM 8.0* 8.4*  --  8.3* 8.7*  --  8.8*  MG 1.8 1.6* 1.8  --  1.7 1.8  --   PHOS 2.1* 2.5  --   --   --   --   --     ABG: Recent Labs  Lab 12/31/20 1540 01/01/21 0454 01/04/21 0730 01/04/21 1015 01/05/21 0640  PHART 7.378 7.307* 7.071* 7.320* 7.354  PCO2ART 51.0* 61.7* >120.0* 65.0* 59.4*  PO2ART 179* 85.5 74.8* 114* 128*  HCO3 29.4* 30.3* 33.6* 32.5* 32.2*  O2SAT 99.6 97.1 91.3 98.7 98.9    Liver Function Tests: No  results for input(s): AST, ALT, ALKPHOS, BILITOT, PROT, ALBUMIN in the last 168 hours. No results for input(s): LIPASE, AMYLASE in the last 168 hours. Recent Labs  Lab 01/04/21 0415  AMMONIA 62*    CBC: Recent Labs  Lab 12/31/20 0854 01/01/21 0505 01/03/21 0449 01/04/21 0700 01/06/21 0439  WBC 16.0* 15.2* 12.5* 19.9* 10.5  HGB 7.7* 7.6* 7.5* 8.1* 6.8*  HCT 23.7* 25.4* 25.1* 28.5* 21.8*  MCV 95.2 98.1 100.0 104.4* 99.1  PLT 520* 511* 494* 712* 502*    Cardiac Enzymes: No results for input(s): CKTOTAL, CKMB, CKMBINDEX, TROPONINI in the last 168 hours.  BNP (last 3 results) No results for input(s): BNP in the last 8760 hours.  ProBNP (last 3 results) No results for input(s): PROBNP in the last 8760 hours.  Radiological Exams: No results found.  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 Acute on chronic  respiratory failure hypoxia patient continues on the ventilator and full support respiratory therapy will assess readiness for pressure support wean 2. COVID-19 virus infection in recovery we will continue to follow 3. ARDS treated slow improvement 4. Healthcare associated pneumonia treated slow improvement 5. 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 The Patient requires high complexity decision making with multiple systems involvement.  Rounds were done with the Respiratory Therapy Director and Staff therapists and discussed with nursing staff also.  Yevonne Pax, MD [facility] Pulmonary Critical Care Medicine Sleep Medicine

## 2021-01-07 LAB — BASIC METABOLIC PANEL
Anion gap: 9 (ref 5–15)
BUN: 28 mg/dL — ABNORMAL HIGH (ref 8–23)
CO2: 34 mmol/L — ABNORMAL HIGH (ref 22–32)
Calcium: 8.9 mg/dL (ref 8.9–10.3)
Chloride: 107 mmol/L (ref 98–111)
Creatinine, Ser: 0.55 mg/dL — ABNORMAL LOW (ref 0.61–1.24)
GFR, Estimated: >60 mL/min (ref >60)
Glucose, Bld: 122 mg/dL — ABNORMAL HIGH (ref 70–99)
Potassium: 4.7 mmol/L (ref 3.5–5.1)
Sodium: 150 mmol/L — ABNORMAL HIGH (ref 135–145)

## 2021-01-07 LAB — AMMONIA: Ammonia: 22 umol/L (ref 9–35)

## 2021-01-07 LAB — CBC
HCT: 26.1 % — ABNORMAL LOW (ref 39.0–52.0)
Hemoglobin: 8.2 g/dL — ABNORMAL LOW (ref 13.0–17.0)
MCH: 30.7 pg (ref 26.0–34.0)
MCHC: 31.4 g/dL (ref 30.0–36.0)
MCV: 97.8 fL (ref 80.0–100.0)
Platelets: 498 10*3/uL — ABNORMAL HIGH (ref 150–400)
RBC: 2.67 MIL/uL — ABNORMAL LOW (ref 4.22–5.81)
RDW: 18 % — ABNORMAL HIGH (ref 11.5–15.5)
WBC: 10.7 10*3/uL — ABNORMAL HIGH (ref 4.0–10.5)
nRBC: 0.4 % — ABNORMAL HIGH (ref 0.0–0.2)

## 2021-01-07 LAB — TYPE AND SCREEN
ABO/RH(D): B POS
Antibody Screen: NEGATIVE
Unit division: 0

## 2021-01-07 LAB — BPAM RBC
Blood Product Expiration Date: 202202012359
ISSUE DATE / TIME: 202201151036
Unit Type and Rh: 7300

## 2021-01-08 ENCOUNTER — Other Ambulatory Visit (HOSPITAL_COMMUNITY): Payer: Federal, State, Local not specified - PPO

## 2021-01-08 DIAGNOSIS — J159 Unspecified bacterial pneumonia: Secondary | ICD-10-CM | POA: Diagnosis not present

## 2021-01-08 DIAGNOSIS — J9621 Acute and chronic respiratory failure with hypoxia: Secondary | ICD-10-CM | POA: Diagnosis not present

## 2021-01-08 DIAGNOSIS — I87099 Postthrombotic syndrome with other complications of unspecified lower extremity: Secondary | ICD-10-CM | POA: Diagnosis not present

## 2021-01-08 DIAGNOSIS — U071 COVID-19: Secondary | ICD-10-CM | POA: Diagnosis not present

## 2021-01-08 LAB — BASIC METABOLIC PANEL
Anion gap: 12 (ref 5–15)
BUN: 27 mg/dL — ABNORMAL HIGH (ref 8–23)
CO2: 28 mmol/L (ref 22–32)
Calcium: 8.4 mg/dL — ABNORMAL LOW (ref 8.9–10.3)
Chloride: 105 mmol/L (ref 98–111)
Creatinine, Ser: 0.51 mg/dL — ABNORMAL LOW (ref 0.61–1.24)
GFR, Estimated: >60 mL/min (ref >60)
Glucose, Bld: 115 mg/dL — ABNORMAL HIGH (ref 70–99)
Potassium: 5.2 mmol/L — ABNORMAL HIGH (ref 3.5–5.1)
Sodium: 145 mmol/L (ref 135–145)

## 2021-01-08 LAB — BLOOD GAS, ARTERIAL
Acid-Base Excess: 11.9 mmol/L — ABNORMAL HIGH (ref 0.0–2.0)
Bicarbonate: 37.9 mmol/L — ABNORMAL HIGH (ref 20.0–28.0)
FIO2: 50
O2 Saturation: 98.3 %
Patient temperature: 37
pCO2 arterial: 70.7 mmHg (ref 32.0–48.0)
pH, Arterial: 7.348 — ABNORMAL LOW (ref 7.350–7.450)
pO2, Arterial: 114 mmHg — ABNORMAL HIGH (ref 83.0–108.0)

## 2021-01-08 NOTE — Progress Notes (Signed)
Pulmonary Critical Care Medicine [facility] [place]   PULMONARY CRITICAL CARE SERVICE  PROGRESS NOTE  Date of Service: 01/08/2021  Scott Roberts  YQI:[id number]  DOB: 14-Dec-1956   DOA: 12/27/2020  Referring Physician: [name], MD  HPI: Scott Roberts is a 65 y.o. adult seen for follow up of Acute on Chronic Respiratory Failure.  Patient currently is on pressure support has been on 40% FiO2 with a goal of 16 hours  Medications: Reviewed on Rounds  Physical Exam:  Vitals: Temperature is 96.8 pulse 66 respiratory rate 30 blood pressure is 156/97 saturations 98%  Ventilator Settings on pressure support FiO2 40% pressure of 12/9  . General: Comfortable at this time . Eyes: Grossly normal lids, irises & conjunctiva . ENT: grossly tongue is normal . Neck: no obvious mass . Cardiovascular: S1 S2 normal no gallop . Respiratory: Scattered rhonchi expansion is equal . Abdomen: soft . Skin: no rash seen on limited exam . Musculoskeletal: not rigid . Psychiatric:unable to assess . Neurologic: no seizure no involuntary movements         Lab Data:   Basic Metabolic Panel: Recent Labs  Lab 01/02/21 0428 01/03/21 0449 01/04/21 0700 01/05/21 0710 01/06/21 0439 01/07/21 0505 01/08/21 0339  NA 146* 146* 145  --  146* 150* 145  K  --  3.8 4.1  --  3.4* 4.7 5.2*  CL  --  107 105  --  105 107 105  CO2  --  30 31  --  33* 34* 28  GLUCOSE  --  146* 162*  --  122* 122* 115*  BUN  --  23 34*  --  31* 28* 27*  CREATININE  --  0.61 0.64  --  0.59* 0.55* 0.51*  CALCIUM  --  8.3* 8.7*  --  8.8* 8.9 8.4*  MG 1.8  --  1.7 1.8  --   --   --     ABG: Recent Labs  Lab 01/04/21 0730 01/04/21 1015 01/05/21 0640  PHART 7.071* 7.320* 7.354  PCO2ART >120.0* 65.0* 59.4*  PO2ART 74.8* 114* 128*  HCO3 33.6* 32.5* 32.2*  O2SAT 91.3 98.7 98.9    Liver Function Tests: No results for input(s): AST, ALT, ALKPHOS, BILITOT, PROT, ALBUMIN in the last 168 hours. No results for  input(s): LIPASE, AMYLASE in the last 168 hours. Recent Labs  Lab 01/04/21 0415 01/07/21 0505  AMMONIA 62* 22    CBC: Recent Labs  Lab 01/03/21 0449 01/04/21 0700 01/06/21 0439 01/07/21 0505  WBC 12.5* 19.9* 10.5 10.7*  HGB 7.5* 8.1* 6.8* 8.2*  HCT 25.1* 28.5* 21.8* 26.1*  MCV 100.0 104.4* 99.1 97.8  PLT 494* 712* 502* 498*    Cardiac Enzymes: No results for input(s): CKTOTAL, CKMB, CKMBINDEX, TROPONINI in the last 168 hours.  BNP (last 3 results) No results for input(s): BNP in the last 8760 hours.  ProBNP (last 3 results) No results for input(s): PROBNP in the last 8760 hours.  Radiological Exams: No results found.  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 Acute on chronic respiratory failure hypoxia we will continue with pressure support currently on 40% FiO2 with a goal of 16 hours 2. COVID-19 virus infection recovery 3. ARDS treated improving 4. Healthcare associated  pneumonia has been treated 5. Chronic DVT patient is at   I have personally seen and evaluated the patient, evaluated laboratory and imaging results, formulated the assessment and plan and placed orders. The Patient requires high complexity decision making with multiple systems involvement.  Rounds were done with the Respiratory Therapy Director and Staff therapists and discussed with nursing staff also.  Yevonne Pax, MD [facility] Pulmonary Critical Care Medicine Sleep Medicine

## 2021-01-09 ENCOUNTER — Other Ambulatory Visit (HOSPITAL_COMMUNITY): Payer: Federal, State, Local not specified - PPO

## 2021-01-09 DIAGNOSIS — I87099 Postthrombotic syndrome with other complications of unspecified lower extremity: Secondary | ICD-10-CM | POA: Diagnosis not present

## 2021-01-09 DIAGNOSIS — J159 Unspecified bacterial pneumonia: Secondary | ICD-10-CM | POA: Diagnosis not present

## 2021-01-09 DIAGNOSIS — J9621 Acute and chronic respiratory failure with hypoxia: Secondary | ICD-10-CM | POA: Diagnosis not present

## 2021-01-09 DIAGNOSIS — U071 COVID-19: Secondary | ICD-10-CM | POA: Diagnosis not present

## 2021-01-09 LAB — POTASSIUM: Potassium: 2.9 mmol/L — ABNORMAL LOW (ref 3.5–5.1)

## 2021-01-09 LAB — C DIFFICILE (CDIFF) QUICK SCRN (NO PCR REFLEX)
C Diff antigen: NEGATIVE
C Diff interpretation: NOT DETECTED
C Diff toxin: NEGATIVE

## 2021-01-09 LAB — BLOOD GAS, ARTERIAL
Acid-Base Excess: 13.2 mmol/L — ABNORMAL HIGH (ref 0.0–2.0)
Bicarbonate: 37.5 mmol/L — ABNORMAL HIGH (ref 20.0–28.0)
FIO2: 40
O2 Saturation: 98.2 %
Patient temperature: 37.1
pCO2 arterial: 49.8 mmHg — ABNORMAL HIGH (ref 32.0–48.0)
pH, Arterial: 7.49 — ABNORMAL HIGH (ref 7.350–7.450)
pO2, Arterial: 96.8 mmHg (ref 83.0–108.0)

## 2021-01-09 LAB — CBC
HCT: 27.4 % — ABNORMAL LOW (ref 39.0–52.0)
Hemoglobin: 8.7 g/dL — ABNORMAL LOW (ref 13.0–17.0)
MCH: 31.2 pg (ref 26.0–34.0)
MCHC: 31.8 g/dL (ref 30.0–36.0)
MCV: 98.2 fL (ref 80.0–100.0)
Platelets: 414 10*3/uL — ABNORMAL HIGH (ref 150–400)
RBC: 2.79 MIL/uL — ABNORMAL LOW (ref 4.22–5.81)
RDW: 18.6 % — ABNORMAL HIGH (ref 11.5–15.5)
WBC: 10.9 10*3/uL — ABNORMAL HIGH (ref 4.0–10.5)
nRBC: 0 % (ref 0.0–0.2)

## 2021-01-09 MED FILL — Medication: Qty: 1 | Status: AC

## 2021-01-09 NOTE — Progress Notes (Signed)
Pulmonary Critical Care Medicine [facility] [place]   PULMONARY CRITICAL CARE SERVICE  PROGRESS NOTE  Date of Service: 01/09/2021  Scott Roberts  OEU:[id number]  DOB: [birth year]   DOA: 12/27/2020  Referring Physician: [name], MD  HPI: Scott Roberts is a 65 y.o. adult seen for follow up of Acute on Chronic Respiratory Failure.  Patient at this time is on full support on assist control holding the wean right now  Medications: Reviewed on Rounds  Physical Exam:  Vitals: Temperature is 97.6 pulse 78 respiratory rate is 30 blood pressure is 126/78 saturations 100%  Ventilator Settings on assist control FiO2 is 40% tidal volume 450 PEEP 8  . General: Comfortable at this time . Eyes: Grossly normal lids, irises & conjunctiva . ENT: grossly tongue is normal . Neck: no obvious mass . Cardiovascular: S1 S2 normal no gallop . Respiratory: No rhonchi very coarse breath sounds . Abdomen: soft . Skin: no rash seen on limited exam . Musculoskeletal: not rigid . Psychiatric:unable to assess . Neurologic: no seizure no involuntary movements         Lab Data:   Basic Metabolic Panel: Recent Labs  Lab 01/03/21 0449 01/04/21 0700 01/05/21 0710 01/06/21 0439 01/07/21 0505 01/08/21 0339 01/09/21 0724  NA 146* 145  --  146* 150* 145  --   K 3.8 4.1  --  3.4* 4.7 5.2* 2.9*  CL 107 105  --  105 107 105  --   CO2 30 31  --  33* 34* 28  --   GLUCOSE 146* 162*  --  122* 122* 115*  --   BUN 23 34*  --  31* 28* 27*  --   CREATININE 0.61 0.64  --  0.59* 0.55* 0.51*  --   CALCIUM 8.3* 8.7*  --  8.8* 8.9 8.4*  --   MG  --  1.7 1.8  --   --   --   --     ABG: Recent Labs  Lab 01/04/21 0730 01/04/21 1015 01/05/21 0640 01/08/21 1635 01/09/21 0445  PHART 7.071* 7.320* 7.354 7.348* 7.490*  PCO2ART >120.0* 65.0* 59.4* 70.7* 49.8*  PO2ART 74.8* 114* 128* 114* 96.8  HCO3 33.6* 32.5* 32.2* 37.9* 37.5*  O2SAT 91.3 98.7 98.9 98.3 98.2    Liver Function Tests: No  results for input(s): AST, ALT, ALKPHOS, BILITOT, PROT, ALBUMIN in the last 168 hours. No results for input(s): LIPASE, AMYLASE in the last 168 hours. Recent Labs  Lab 01/04/21 0415 01/07/21 0505  AMMONIA 62* 22    CBC: Recent Labs  Lab 01/03/21 0449 01/04/21 0700 01/06/21 0439 01/07/21 0505 01/09/21 0724  WBC 12.5* 19.9* 10.5 10.7* 10.9*  HGB 7.5* 8.1* 6.8* 8.2* 8.7*  HCT 25.1* 28.5* 21.8* 26.1* 27.4*  MCV 100.0 104.4* 99.1 97.8 98.2  PLT 494* 712* 502* 498* 414*    Cardiac Enzymes: No results for input(s): CKTOTAL, CKMB, CKMBINDEX, TROPONINI in the last 168 hours.  BNP (last 3 results) No results for input(s): BNP in the last 8760 hours.  ProBNP (last 3 results) No results for input(s): PROBNP in the last 8760 hours.  Radiological Exams: DG CHEST PORT 1 VIEW  Result Date: 01/08/2021 CLINICAL DATA:  Respiratory failure EXAM: PORTABLE CHEST 1 VIEW COMPARISON:  January 04, 2021 FINDINGS: Tracheostomy catheter tip is 4.6 cm above the carina. No pneumothorax. There are apparent pleural effusions bilaterally with ill-defined airspace opacity throughout both lungs. Heart is upper normal in size with pulmonary vascularity normal. No  adenopathy. No bone lesions. IMPRESSION: Tracheostomy as described without pneumothorax. Pleural effusions bilaterally. Widespread airspace opacity which could represent widespread pulmonary edema and/or multifocal pneumonia. Both entities may be present concurrently. Stable cardiac silhouette. Electronically Signed   By: Bretta Bang III M.D.   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 Acute on chronic respiratory failure hypoxia we will hold off on the wean  today because of diffuse airspace disease.  Respiratory therapy will reassess 2. COVID-19 virus infection in recovery 3. ARDS slow improvement 4. Healthcare associated pneumonia has been treated with antibiotics 5. Chronic DVT on anticoagulation   I have personally seen and evaluated the patient, evaluated laboratory and imaging results, formulated the assessment and plan and placed orders. The Patient requires high complexity decision making with multiple systems involvement.  Rounds were done with the Respiratory Therapy Director and Staff therapists and discussed with nursing staff also.  Yevonne Pax, MD [facility] Pulmonary Critical Care Medicine Sleep Medicine

## 2021-01-09 NOTE — Consult Note (Signed)
Referring Physician: Sharyon Medicus MD/Brown, MD  Scott Roberts is an 65 y.o. adult.                       Chief Complaint: Bradycardia  HPI: 65 years old male with acute on chrnic respiratory failure, HTN, type 2 DM, Hyperlipidemia, S/P COVID -19 infection, DVT, PE and Acute renal failure has paroxysmal atrial fibrillation with Lovenox anticoagulation. She had episodes of sinus and junctional bradycardia with Diltiazem and Metoprolol use.  Past Medical History:  Diagnosis Date  . Acute on chronic respiratory failure with hypoxia (HCC)   . Acute respiratory distress syndrome (ARDS) due to COVID-19 virus (HCC)   . 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 COVID-19 virus infection   . Healthcare associated bacterial pneumonia     Past medical history: Diabetes mellitus type 2 DVT COVID-19 Hypertension Hyperlipidemia Respiratory failure  Past surgical history: Tracheostomy Peg  Family history: Noncontributory  Allergies: Reviewed on the medical record.  The histories are not reviewed yet. Please review them in the "History" navigator section and refresh this SmartLink.  No family history on file. Social History:  has no history on file for tobacco use, alcohol use, and drug use.  Allergies: Not on File  No medications prior to admission.    Results for orders placed or performed during the hospital encounter of 12/27/20 (from the past 48 hour(s))  Basic metabolic panel     Status: Abnormal   Collection Time: 01/08/21  3:39 AM  Result Value Ref Range   Sodium 145 135 - 145 mmol/L   Potassium 5.2 (H) 3.5 - 5.1 mmol/L    Comment: SLIGHT HEMOLYSIS   Chloride 105 98 - 111 mmol/L   CO2 28 22 - 32 mmol/L   Glucose, Bld 115 (H) 70 - 99 mg/dL    Comment: Glucose reference range applies only to samples taken after fasting for at least 8 hours.   BUN 27 (H) 8 - 23 mg/dL   Creatinine, Ser 2.70 (L) 0.61 - 1.24 mg/dL   Calcium 8.4 (L) 8.9 - 10.3 mg/dL    GFR, Estimated >62 >37 mL/min    Comment: (NOTE) Calculated using the CKD-EPI Creatinine Equation (2021)    Anion gap 12 5 - 15    Comment: Performed at [facility], [address] [place], [place] [address]  Blood gas, arterial     Status: Abnormal   Collection Time: 01/08/21  4:35 PM  Result Value Ref Range   FIO2 50.00    pH, Arterial 7.348 (L) 7.350 - 7.450   pCO2 arterial 70.7 (HH) 32.0 - 48.0 mmHg    Comment: CRITICAL RESULT CALLED TO, READ BACK BY AND VERIFIED WITH: B.POWERS RRT 1656 01/08/21 MCCORMICK K    pO2, Arterial 114 (H) 83.0 - 108.0 mmHg   Bicarbonate 37.9 (H) 20.0 - 28.0 mmol/L   Acid-Base Excess 11.9 (H) 0.0 - 2.0 mmol/L   O2 Saturation 98.3 %   Patient temperature 37.0    Collection site LEFT RADIAL    Drawn by COLLECTED BY RT     Comment: B.POWERS   Sample type ARTERIAL DRAW    Allens test (pass/fail) PASS PASS    Comment: Performed at [facility], [address], [place], [place] [address]  Blood gas, arterial     Status: Abnormal   Collection Time: 01/09/21  4:45 AM  Result Value Ref Range   FIO2 40.00  pH, Arterial 7.490 (H) 7.350 - 7.450   pCO2 arterial 49.8 (H) 32.0 - 48.0 mmHg   pO2, Arterial 96.8 83.0 - 108.0 mmHg   Bicarbonate 37.5 (H) 20.0 - 28.0 mmol/L   Acid-Base Excess 13.2 (H) 0.0 - 2.0 mmol/L   O2 Saturation 98.2 %   Patient temperature 37.1    Collection site RIGHT RADIAL    Drawn by Addison Naegeli, RRT    Sample type ARTHROGRAPHIS SPECIES    Allens test (pass/fail) PASS PASS    Comment: Performed at [facility], [address] [place], [place] [address]  CBC     Status: Abnormal   Collection Time: 01/09/21  7:24 AM  Result Value Ref Range   WBC 10.9 (H) 4.0 - 10.5 K/uL   RBC 2.79 (L) 4.22 - 5.81 MIL/uL   Hemoglobin 8.7 (L) 13.0 - 17.0 g/dL   HCT 34.7 (L) 42.5 - 95.6 %   MCV 98.2 80.0 - 100.0 fL   MCH 31.2 26.0 - 34.0 pg   MCHC 31.8 30.0 - 36.0 g/dL   RDW 38.7 (H) 56.4 - 33.2 %   Platelets 414 (H) 150 - 400  K/uL   nRBC 0.0 0.0 - 0.2 %    Comment: Performed at [facility], [address], [place], [place] [address]  Potassium     Status: Abnormal   Collection Time: 01/09/21  7:24 AM  Result Value Ref Range   Potassium 2.9 (L) 3.5 - 5.1 mmol/L    Comment: Performed at [facility], [address], [place], [place] [address]   DG CHEST PORT 1 VIEW  Result Date: 01/08/2021 CLINICAL DATA:  Respiratory failure EXAM: PORTABLE CHEST 1 VIEW COMPARISON:  January 04, 2021 FINDINGS: Tracheostomy catheter tip is 4.6 cm above the carina. No pneumothorax. There are apparent pleural effusions bilaterally with ill-defined airspace opacity throughout both lungs. Heart is upper normal in size with pulmonary vascularity normal. No adenopathy. No bone lesions. IMPRESSION: Tracheostomy as described without pneumothorax. Pleural effusions bilaterally. Widespread airspace opacity which could represent widespread pulmonary edema and/or multifocal pneumonia. Both entities may be present concurrently. Stable cardiac silhouette. Electronically Signed   By: Bretta Bang III M.D.   On: 01/08/2021 14:36    Review Of Systems As per HPI.  P:90's, R: 28, BP: 133/76, O2 sat 99 % on 40 % FiO2, 450 TV and 7 PEEP.  There were no vitals taken for this visit. There is no height or weight on file to calculate BMI. General appearance: alert, cooperative, appears stated age and no distress Head: Normocephalic, atraumatic. Eyes: Blue eyes, pink conjunctiva, corneas clear.  Neck: No adenopathy, no carotid bruit, no JVD, supple, symmetrical, tracheostomy in place. Resp: Scattered rhonchi to auscultation bilaterally. Cardio: Irregular rate and rhythm, S1, S2 normal, II/VI systolic murmur, no click, rub or gallop GI: Soft, non-tender; bowel sounds normal; no organomegaly. Extremities: 2 + edema, no cyanosis or clubbing. Skin: Warm and dry.  Neurologic: Alert and oriented X 0. Awake.   Assessment/Plan Acute on chronic  respiratory failure with hypoxia Paroxysmal atrial fibrillation COVID-19 pneumonia Episodes of junctional rhythm DVT PE Type 2 DM Hyperlipidemia Moderate MR Mild TR  Decrease metoprolol dose marginally to improve heart rate. Add small dose hydralazine for lowering BP without bradycardia.  Time spent: Review of old records, Lab, x-rays, EKG, other cardiac tests, examination, discussion with patient over 70 minutes.  Ricki Rodriguez, MD  01/09/2021, 2:21 PM

## 2021-01-10 DIAGNOSIS — I87099 Postthrombotic syndrome with other complications of unspecified lower extremity: Secondary | ICD-10-CM | POA: Diagnosis not present

## 2021-01-10 DIAGNOSIS — U071 COVID-19: Secondary | ICD-10-CM | POA: Diagnosis not present

## 2021-01-10 DIAGNOSIS — J159 Unspecified bacterial pneumonia: Secondary | ICD-10-CM | POA: Diagnosis not present

## 2021-01-10 DIAGNOSIS — J9621 Acute and chronic respiratory failure with hypoxia: Secondary | ICD-10-CM | POA: Diagnosis not present

## 2021-01-10 LAB — RENAL FUNCTION PANEL
Albumin: 2.1 g/dL — ABNORMAL LOW (ref 3.5–5.0)
Anion gap: 9 (ref 5–15)
BUN: 16 mg/dL (ref 8–23)
CO2: 35 mmol/L — ABNORMAL HIGH (ref 22–32)
Calcium: 8.7 mg/dL — ABNORMAL LOW (ref 8.9–10.3)
Chloride: 104 mmol/L (ref 98–111)
Creatinine, Ser: 0.53 mg/dL — ABNORMAL LOW (ref 0.61–1.24)
GFR, Estimated: >60 mL/min (ref >60)
Glucose, Bld: 118 mg/dL — ABNORMAL HIGH (ref 70–99)
Phosphorus: 2.5 mg/dL (ref 2.5–4.6)
Potassium: 3.3 mmol/L — ABNORMAL LOW (ref 3.5–5.1)
Sodium: 148 mmol/L — ABNORMAL HIGH (ref 135–145)

## 2021-01-10 LAB — URINALYSIS, ROUTINE W REFLEX MICROSCOPIC
Bacteria, UA: NONE SEEN
Bilirubin Urine: NEGATIVE
Glucose, UA: NEGATIVE mg/dL
Hgb urine dipstick: NEGATIVE
Ketones, ur: NEGATIVE mg/dL
Nitrite: NEGATIVE
Protein, ur: 30 mg/dL — AB
Specific Gravity, Urine: 1.016 (ref 1.005–1.030)
WBC, UA: >50 WBC/hpf — ABNORMAL HIGH (ref 0–5)
pH: 7 (ref 5.0–8.0)

## 2021-01-10 LAB — CBC
HCT: 28.7 % — ABNORMAL LOW (ref 39.0–52.0)
Hemoglobin: 8.8 g/dL — ABNORMAL LOW (ref 13.0–17.0)
MCH: 30.8 pg (ref 26.0–34.0)
MCHC: 30.7 g/dL (ref 30.0–36.0)
MCV: 100.3 fL — ABNORMAL HIGH (ref 80.0–100.0)
Platelets: 357 10*3/uL (ref 150–400)
RBC: 2.86 MIL/uL — ABNORMAL LOW (ref 4.22–5.81)
RDW: 19 % — ABNORMAL HIGH (ref 11.5–15.5)
WBC: 16.9 10*3/uL — ABNORMAL HIGH (ref 4.0–10.5)
nRBC: 0 % (ref 0.0–0.2)

## 2021-01-10 LAB — MAGNESIUM: Magnesium: 1.6 mg/dL — ABNORMAL LOW (ref 1.7–2.4)

## 2021-01-10 LAB — AMMONIA: Ammonia: 20 umol/L (ref 9–35)

## 2021-01-10 NOTE — Progress Notes (Signed)
Pulmonary Critical Care Medicine [facility] [place]   PULMONARY CRITICAL CARE SERVICE  PROGRESS NOTE  Date of Service: 01/10/2021  Scott Roberts  IWP:[id number]  DOB: [birth year]   DOA: 12/27/2020  Referring Physician: [name], MD  HPI: Scott Roberts is a 65 y.o. adult seen for follow up of Acute on Chronic Respiratory Failure.  Patient remains on full support on the ventilator on assist control mode currently requiring 40% FiO2  Medications: Reviewed on Rounds  Physical Exam:  Vitals: Temperature 97.2 pulse 83 respiratory rate 28 blood pressure is 150/93 saturations 97%  Ventilator Settings scattered rhonchi expansion is equal  . General: Comfortable at this time . Eyes: Grossly normal lids, irises & conjunctiva . ENT: grossly tongue is normal . Neck: no obvious mass . Cardiovascular: S1 S2 normal no gallop . Respiratory: Scattered coarse breath sounds are noted . Abdomen: soft . Skin: no rash seen on limited exam . Musculoskeletal: not rigid . Psychiatric:unable to assess . Neurologic: no seizure no involuntary movements         Lab Data:   Basic Metabolic Panel: Recent Labs  Lab 01/04/21 0700 01/05/21 0710 01/06/21 0439 01/07/21 0505 01/08/21 0339 01/09/21 0724 01/10/21 0655  NA 145  --  146* 150* 145  --  148*  K 4.1  --  3.4* 4.7 5.2* 2.9* 3.3*  CL 105  --  105 107 105  --  104  CO2 31  --  33* 34* 28  --  35*  GLUCOSE 162*  --  122* 122* 115*  --  118*  BUN 34*  --  31* 28* 27*  --  16  CREATININE 0.64  --  0.59* 0.55* 0.51*  --  0.53*  CALCIUM 8.7*  --  8.8* 8.9 8.4*  --  8.7*  MG 1.7 1.8  --   --   --   --  1.6*  PHOS  --   --   --   --   --   --  2.5    ABG: Recent Labs  Lab 01/04/21 0730 01/04/21 1015 01/05/21 0640 01/08/21 1635 01/09/21 0445  PHART 7.071* 7.320* 7.354 7.348* 7.490*  PCO2ART >120.0* 65.0* 59.4* 70.7* 49.8*  PO2ART 74.8* 114* 128* 114* 96.8  HCO3 33.6* 32.5* 32.2* 37.9* 37.5*  O2SAT 91.3 98.7 98.9 98.3  98.2    Liver Function Tests: Recent Labs  Lab 01/10/21 0655  ALBUMIN 2.1*   No results for input(s): LIPASE, AMYLASE in the last 168 hours. Recent Labs  Lab 01/04/21 0415 01/07/21 0505 01/10/21 0655  AMMONIA 62* 22 20    CBC: Recent Labs  Lab 01/04/21 0700 01/06/21 0439 01/07/21 0505 01/09/21 0724 01/10/21 0655  WBC 19.9* 10.5 10.7* 10.9* 16.9*  HGB 8.1* 6.8* 8.2* 8.7* 8.8*  HCT 28.5* 21.8* 26.1* 27.4* 28.7*  MCV 104.4* 99.1 97.8 98.2 100.3*  PLT 712* 502* 498* 414* 357    Cardiac Enzymes: No results for input(s): CKTOTAL, CKMB, CKMBINDEX, TROPONINI in the last 168 hours.  BNP (last 3 results) No results for input(s): BNP in the last 8760 hours.  ProBNP (last 3 results) No results for input(s): PROBNP in the last 8760 hours.  Radiological Exams: No results found.  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 Acute on chronic respiratory failure hypoxia we will continue with full support patient is on assist control requiring 40% FiO2.  Will titrate down as tolerated. 2. COVID-19 virus infection recovery we will continue to monitor 3. ARDS treated slowly improving 4. Healthcare associated pneumonia we will continue with present management 5. Chronic venous hypertension DVT supportive care   I have personally seen and evaluated the patient, evaluated laboratory and imaging results, formulated the assessment and plan and placed orders. The Patient requires high complexity decision making with multiple systems involvement.  Rounds were done with the Respiratory Therapy Director and Staff therapists and discussed with nursing staff also.  Yevonne Pax, MD [facility] Pulmonary Critical Care Medicine Sleep  Medicine

## 2021-01-11 DIAGNOSIS — U071 COVID-19: Secondary | ICD-10-CM | POA: Diagnosis not present

## 2021-01-11 DIAGNOSIS — J159 Unspecified bacterial pneumonia: Secondary | ICD-10-CM | POA: Diagnosis not present

## 2021-01-11 DIAGNOSIS — I87099 Postthrombotic syndrome with other complications of unspecified lower extremity: Secondary | ICD-10-CM | POA: Diagnosis not present

## 2021-01-11 DIAGNOSIS — J9621 Acute and chronic respiratory failure with hypoxia: Secondary | ICD-10-CM | POA: Diagnosis not present

## 2021-01-11 LAB — CBC
HCT: 25.8 % — ABNORMAL LOW (ref 39.0–52.0)
Hemoglobin: 7.7 g/dL — ABNORMAL LOW (ref 13.0–17.0)
MCH: 30 pg (ref 26.0–34.0)
MCHC: 29.8 g/dL — ABNORMAL LOW (ref 30.0–36.0)
MCV: 100.4 fL — ABNORMAL HIGH (ref 80.0–100.0)
Platelets: 296 10*3/uL (ref 150–400)
RBC: 2.57 MIL/uL — ABNORMAL LOW (ref 4.22–5.81)
RDW: 19.1 % — ABNORMAL HIGH (ref 11.5–15.5)
WBC: 10.4 10*3/uL (ref 4.0–10.5)
nRBC: 0 % (ref 0.0–0.2)

## 2021-01-11 LAB — BASIC METABOLIC PANEL
Anion gap: 11 (ref 5–15)
BUN: 20 mg/dL (ref 8–23)
CO2: 34 mmol/L — ABNORMAL HIGH (ref 22–32)
Calcium: 8.5 mg/dL — ABNORMAL LOW (ref 8.9–10.3)
Chloride: 100 mmol/L (ref 98–111)
Creatinine, Ser: 0.51 mg/dL — ABNORMAL LOW (ref 0.61–1.24)
GFR, Estimated: >60 mL/min (ref >60)
Glucose, Bld: 109 mg/dL — ABNORMAL HIGH (ref 70–99)
Potassium: 3.4 mmol/L — ABNORMAL LOW (ref 3.5–5.1)
Sodium: 145 mmol/L (ref 135–145)

## 2021-01-11 LAB — URINE CULTURE: Culture: NO GROWTH

## 2021-01-11 LAB — PHOSPHORUS: Phosphorus: 2.4 mg/dL — ABNORMAL LOW (ref 2.5–4.6)

## 2021-01-11 LAB — MAGNESIUM: Magnesium: 1.8 mg/dL (ref 1.7–2.4)

## 2021-01-11 NOTE — Progress Notes (Signed)
Pulmonary Critical Care Medicine [facility] [place]   PULMONARY CRITICAL CARE SERVICE  PROGRESS NOTE  Date of Service: 01/11/2021  Scott Roberts  XBM:[id number]  DOB: [birth year]   DOA: 12/27/2020  Referring Physician: [name], MD  HPI: Scott Roberts is a 65 y.o. adult seen for follow up of Acute on Chronic Respiratory Failure.  Remains on the ventilator on full support on assist control mode currently is requiring 40% FiO2 has not been tolerating attempts at weaning  Medications: Reviewed on Rounds  Physical Exam:  Vitals: Temperature is 96.8 pulse 74 respiratory 27 blood pressure is 131/80 saturations 99%  Ventilator Settings on assist control FiO2 is 40% tidal volume was 500 PEEP of 6  . General: Comfortable at this time . Eyes: Grossly normal lids, irises & conjunctiva . ENT: grossly tongue is normal . Neck: no obvious mass . Cardiovascular: S1 S2 normal no gallop . Respiratory: Scattered rhonchi very coarse breath sounds . Abdomen: soft . Skin: no rash seen on limited exam . Musculoskeletal: not rigid . Psychiatric:unable to assess . Neurologic: no seizure no involuntary movements         Lab Data:   Basic Metabolic Panel: Recent Labs  Lab 01/05/21 0710 01/06/21 0439 01/06/21 0439 01/07/21 0505 01/08/21 0339 01/09/21 0724 01/10/21 0655 01/11/21 0636  NA  --  146*  --  150* 145  --  148* 145  K  --  3.4*   < > 4.7 5.2* 2.9* 3.3* 3.4*  CL  --  105  --  107 105  --  104 100  CO2  --  33*  --  34* 28  --  35* 34*  GLUCOSE  --  122*  --  122* 115*  --  118* 109*  BUN  --  31*  --  28* 27*  --  16 20  CREATININE  --  0.59*  --  0.55* 0.51*  --  0.53* 0.51*  CALCIUM  --  8.8*  --  8.9 8.4*  --  8.7* 8.5*  MG 1.8  --   --   --   --   --  1.6* 1.8  PHOS  --   --   --   --   --   --  2.5 2.4*   < > = values in this interval not displayed.    ABG: Recent Labs  Lab 01/05/21 0640 01/08/21 1635 01/09/21 0445  PHART 7.354 7.348* 7.490*   PCO2ART 59.4* 70.7* 49.8*  PO2ART 128* 114* 96.8  HCO3 32.2* 37.9* 37.5*  O2SAT 98.9 98.3 98.2    Liver Function Tests: Recent Labs  Lab 01/10/21 0655  ALBUMIN 2.1*   No results for input(s): LIPASE, AMYLASE in the last 168 hours. Recent Labs  Lab 01/07/21 0505 01/10/21 0655  AMMONIA 22 20    CBC: Recent Labs  Lab 01/06/21 0439 01/07/21 0505 01/09/21 0724 01/10/21 0655 01/11/21 0636  WBC 10.5 10.7* 10.9* 16.9* 10.4  HGB 6.8* 8.2* 8.7* 8.8* 7.7*  HCT 21.8* 26.1* 27.4* 28.7* 25.8*  MCV 99.1 97.8 98.2 100.3* 100.4*  PLT 502* 498* 414* 357 296    Cardiac Enzymes: No results for input(s): CKTOTAL, CKMB, CKMBINDEX, TROPONINI in the last 168 hours.  BNP (last 3 results) No results for input(s): BNP in the last 8760 hours.  ProBNP (last 3 results) No results for input(s): PROBNP in the last 8760 hours.  Radiological Exams: No results found.  Assessment/Plan Active Problems:   Acute  on chronic respiratory failure with hypoxia (HCC)   COVID-19 virus infection   Acute respiratory distress syndrome (ARDS) due to COVID-19 virus [facility])   Healthcare associated bacterial pneumonia   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 Acute on chronic respiratory failure with hypoxia patient continues on full support on assist control mode adjust the volumes according to mechanics.  Patient has ARDS physiology so need to keep on the lower side is for his volumes are concerned 2. COVID-19 virus infection in recovery we will continue with supportive care. 3. ARDS due to COVID-19 treated we will continue to follow 4. Healthcare associated pneumonia has been treated with antibiotics we will continue with supportive care 5. DVT treated with anticoagulation   I have personally seen and evaluated the patient, evaluated laboratory and imaging results, formulated the assessment and plan and placed orders. The Patient requires high complexity decision making with  multiple systems involvement.  Rounds were done with the Respiratory Therapy Director and Staff therapists and discussed with nursing staff also.  Yevonne Pax, MD [facility] Pulmonary Critical Care Medicine Sleep Medicine

## 2021-01-12 ENCOUNTER — Other Ambulatory Visit (HOSPITAL_COMMUNITY): Payer: Federal, State, Local not specified - PPO

## 2021-01-12 DIAGNOSIS — I87099 Postthrombotic syndrome with other complications of unspecified lower extremity: Secondary | ICD-10-CM | POA: Diagnosis not present

## 2021-01-12 DIAGNOSIS — U071 COVID-19: Secondary | ICD-10-CM | POA: Diagnosis not present

## 2021-01-12 DIAGNOSIS — J159 Unspecified bacterial pneumonia: Secondary | ICD-10-CM | POA: Diagnosis not present

## 2021-01-12 DIAGNOSIS — J9621 Acute and chronic respiratory failure with hypoxia: Secondary | ICD-10-CM | POA: Diagnosis not present

## 2021-01-12 LAB — RENAL FUNCTION PANEL
Albumin: 2.1 g/dL — ABNORMAL LOW (ref 3.5–5.0)
Anion gap: 11 (ref 5–15)
BUN: 20 mg/dL (ref 8–23)
CO2: 32 mmol/L (ref 22–32)
Calcium: 8.5 mg/dL — ABNORMAL LOW (ref 8.9–10.3)
Chloride: 98 mmol/L (ref 98–111)
Creatinine, Ser: 0.46 mg/dL — ABNORMAL LOW (ref 0.61–1.24)
GFR, Estimated: >60 mL/min (ref >60)
Glucose, Bld: 87 mg/dL (ref 70–99)
Phosphorus: 2.5 mg/dL (ref 2.5–4.6)
Potassium: 3.8 mmol/L (ref 3.5–5.1)
Sodium: 141 mmol/L (ref 135–145)

## 2021-01-12 LAB — CBC
HCT: 27.9 % — ABNORMAL LOW (ref 39.0–52.0)
Hemoglobin: 8.7 g/dL — ABNORMAL LOW (ref 13.0–17.0)
MCH: 31.1 pg (ref 26.0–34.0)
MCHC: 31.2 g/dL (ref 30.0–36.0)
MCV: 99.6 fL (ref 80.0–100.0)
Platelets: 256 10*3/uL (ref 150–400)
RBC: 2.8 MIL/uL — ABNORMAL LOW (ref 4.22–5.81)
RDW: 19 % — ABNORMAL HIGH (ref 11.5–15.5)
WBC: 9.4 10*3/uL (ref 4.0–10.5)
nRBC: 0 % (ref 0.0–0.2)

## 2021-01-12 LAB — MAGNESIUM: Magnesium: 1.8 mg/dL (ref 1.7–2.4)

## 2021-01-12 NOTE — Progress Notes (Signed)
Pulmonary Critical Care Medicine [facility] [place]   PULMONARY CRITICAL CARE SERVICE  PROGRESS NOTE  Date of Service: 01/12/2021  Scott Roberts  NAT:[id number]  DOB: 1956-02-20   DOA: 12/27/2020  Referring Physician: [name], MD  HPI: Scott Roberts is a 65 y.o. adult seen for follow up of Acute on Chronic Respiratory Failure.  Patient is on full support on the ventilator.  Spoke with the wife gave her an update she understands he is quite ill and has multiple issues going on cardiac as well as pulmonary right now which are preventing [place] from being able to wean him  Medications: Reviewed on Rounds  Physical Exam:  Vitals: Temperature is 98.4 pulse 77 respiratory 25 blood pressure is 128/82 saturations 99%  Ventilator Settings on assist control FiO2 40% tidal volume 466 PEEP 6  . General: Comfortable at this time . Eyes: Grossly normal lids, irises & conjunctiva . ENT: grossly tongue is normal . Neck: no obvious mass . Cardiovascular: S1 S2 normal no gallop . Respiratory: No rhonchi very coarse breath sound . Abdomen: soft . Skin: no rash seen on limited exam . Musculoskeletal: not rigid . Psychiatric:unable to assess . Neurologic: no seizure no involuntary movements         Lab Data:   Basic Metabolic Panel: Recent Labs  Lab 01/07/21 0505 01/08/21 0339 01/09/21 0724 01/10/21 0655 01/11/21 0636 01/12/21 0550  NA 150* 145  --  148* 145 141  K 4.7 5.2* 2.9* 3.3* 3.4* 3.8  CL 107 105  --  104 100 98  CO2 34* 28  --  35* 34* 32  GLUCOSE 122* 115*  --  118* 109* 87  BUN 28* 27*  --  16 20 20   CREATININE 0.55* 0.51*  --  0.53* 0.51* 0.46*  CALCIUM 8.9 8.4*  --  8.7* 8.5* 8.5*  MG  --   --   --  1.6* 1.8 1.8  PHOS  --   --   --  2.5 2.4* 2.5    ABG: Recent Labs  Lab 01/08/21 1635 01/09/21 0445  PHART 7.348* 7.490*  PCO2ART 70.7* 49.8*  PO2ART 114* 96.8  HCO3 37.9* 37.5*  O2SAT 98.3 98.2    Liver Function Tests: Recent Labs  Lab  01/10/21 0655 01/12/21 0550  ALBUMIN 2.1* 2.1*   No results for input(s): LIPASE, AMYLASE in the last 168 hours. Recent Labs  Lab 01/07/21 0505 01/10/21 0655  AMMONIA 22 20    CBC: Recent Labs  Lab 01/07/21 0505 01/09/21 0724 01/10/21 0655 01/11/21 0636 01/12/21 0550  WBC 10.7* 10.9* 16.9* 10.4 9.4  HGB 8.2* 8.7* 8.8* 7.7* 8.7*  HCT 26.1* 27.4* 28.7* 25.8* 27.9*  MCV 97.8 98.2 100.3* 100.4* 99.6  PLT 498* 414* 357 296 256    Cardiac Enzymes: No results for input(s): CKTOTAL, CKMB, CKMBINDEX, TROPONINI in the last 168 hours.  BNP (last 3 results) No results for input(s): BNP in the last 8760 hours.  ProBNP (last 3 results) No results for input(s): PROBNP in the last 8760 hours.  Radiological Exams: DG CHEST PORT 1 VIEW  Result Date: 01/12/2021 CLINICAL DATA:  History of pneumonia.  Evaluate pneumothorax EXAM: PORTABLE CHEST 1 VIEW COMPARISON:  Three days ago FINDINGS: Tracheostomy tube in place. Interstitial and airspace opacity on both sides. Small pleural effusions again noted. Cardiomegaly and vascular pedicle widening. No visible pneumothorax. Artifact from EKG leads. IMPRESSION: Unchanged generalized pulmonary opacification. Electronically Signed   By: 01/14/2021.D.  On: 01/12/2021 07:27    Assessment/Plan Active Problems:   Acute on chronic respiratory failure with hypoxia (HCC)   COVID-19 virus infection   Acute respiratory distress syndrome (ARDS) due to COVID-19 virus [facility])   Healthcare associated bacterial pneumonia   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 Acute on chronic respiratory failure hypoxia plan is to continue with full support on the ventilator cardiac status still is not stable 2. COVID-19 virus infection recovery we will continue to monitor 3. ARDS treated slow improvement 4. Healthcare associated pneumonia slow improvement 5. 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 The Patient requires high complexity decision making with multiple systems involvement.  Rounds were done with the Respiratory Therapy Director and Staff therapists and discussed with nursing staff also.  Yevonne Pax, MD [facility] Pulmonary Critical Care Medicine Sleep Medicine

## 2021-01-13 DIAGNOSIS — U071 COVID-19: Secondary | ICD-10-CM | POA: Diagnosis not present

## 2021-01-13 DIAGNOSIS — J9621 Acute and chronic respiratory failure with hypoxia: Secondary | ICD-10-CM | POA: Diagnosis not present

## 2021-01-13 DIAGNOSIS — I87099 Postthrombotic syndrome with other complications of unspecified lower extremity: Secondary | ICD-10-CM | POA: Diagnosis not present

## 2021-01-13 DIAGNOSIS — J159 Unspecified bacterial pneumonia: Secondary | ICD-10-CM | POA: Diagnosis not present

## 2021-01-13 NOTE — Progress Notes (Signed)
Pulmonary Critical Care Medicine [facility] [place]   PULMONARY CRITICAL CARE SERVICE  PROGRESS NOTE  Date of Service: 01/13/2021  Scott Roberts  PZW:[id number]  DOB: 1956-07-12   DOA: 12/27/2020  Referring Physician: [name], MD  HPI: Scott Roberts is a 65 y.o. adult seen for follow up of Acute on Chronic Respiratory Failure.  Was attempted at weaning but did not tolerate back on the ventilator right now on assist control b  Medications: Reviewed on Rounds  Physical Exam:  Vitals: Temperature is 97.2 pulse 73 respiratory rate is 25 blood pressure is 159/92 saturations 99%  Ventilator Settings on assist control FiO2 40% tidal volume 450 PEEP 6  . General: Comfortable at this time . Eyes: Grossly normal lids, irises & conjunctiva . ENT: grossly tongue is normal . Neck: no obvious mass . Cardiovascular: S1 S2 normal no gallop . Respiratory: No rhonchi very coarse breath sounds . Abdomen: soft . Skin: no rash seen on limited exam . Musculoskeletal: not rigid . Psychiatric:unable to assess . Neurologic: no seizure no involuntary movements         Lab Data:   Basic Metabolic Panel: Recent Labs  Lab 01/07/21 0505 01/08/21 0339 01/09/21 0724 01/10/21 0655 01/11/21 0636 01/12/21 0550  NA 150* 145  --  148* 145 141  K 4.7 5.2* 2.9* 3.3* 3.4* 3.8  CL 107 105  --  104 100 98  CO2 34* 28  --  35* 34* 32  GLUCOSE 122* 115*  --  118* 109* 87  BUN 28* 27*  --  16 20 20   CREATININE 0.55* 0.51*  --  0.53* 0.51* 0.46*  CALCIUM 8.9 8.4*  --  8.7* 8.5* 8.5*  MG  --   --   --  1.6* 1.8 1.8  PHOS  --   --   --  2.5 2.4* 2.5    ABG: Recent Labs  Lab 01/08/21 1635 01/09/21 0445  PHART 7.348* 7.490*  PCO2ART 70.7* 49.8*  PO2ART 114* 96.8  HCO3 37.9* 37.5*  O2SAT 98.3 98.2    Liver Function Tests: Recent Labs  Lab 01/10/21 0655 01/12/21 0550  ALBUMIN 2.1* 2.1*   No results for input(s): LIPASE, AMYLASE in the last 168 hours. Recent Labs  Lab  01/07/21 0505 01/10/21 0655  AMMONIA 22 20    CBC: Recent Labs  Lab 01/07/21 0505 01/09/21 0724 01/10/21 0655 01/11/21 0636 01/12/21 0550  WBC 10.7* 10.9* 16.9* 10.4 9.4  HGB 8.2* 8.7* 8.8* 7.7* 8.7*  HCT 26.1* 27.4* 28.7* 25.8* 27.9*  MCV 97.8 98.2 100.3* 100.4* 99.6  PLT 498* 414* 357 296 256    Cardiac Enzymes: No results for input(s): CKTOTAL, CKMB, CKMBINDEX, TROPONINI in the last 168 hours.  BNP (last 3 results) No results for input(s): BNP in the last 8760 hours.  ProBNP (last 3 results) No results for input(s): PROBNP in the last 8760 hours.  Radiological Exams: DG CHEST PORT 1 VIEW  Result Date: 01/12/2021 CLINICAL DATA:  History of pneumonia.  Evaluate pneumothorax EXAM: PORTABLE CHEST 1 VIEW COMPARISON:  Three days ago FINDINGS: Tracheostomy tube in place. Interstitial and airspace opacity on both sides. Small pleural effusions again noted. Cardiomegaly and vascular pedicle widening. No visible pneumothorax. Artifact from EKG leads. IMPRESSION: Unchanged generalized pulmonary opacification. Electronically Signed   By: 01/14/2021 M.D.   On: 01/12/2021 07:27    Assessment/Plan Active Problems:   Acute on chronic respiratory failure with hypoxia (HCC)   COVID-19 virus infection  Acute respiratory distress syndrome (ARDS) due to COVID-19 virus [facility])   Healthcare associated bacterial pneumonia   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 Acute on chronic respiratory failure hypoxia we will continue with assist control has been failing attempts at weaning.  As mentioned I had a conversation with the patient's wife yesterday and gave her update 2. COVID-19 virus infection recovery 3. ARDS very slow to improve 4. Healthcare associated pneumonia has been treated with antibiotics 5. Chronic DVT treated   I have personally seen and evaluated the patient, evaluated laboratory and imaging results, formulated the assessment and plan and  placed orders. The Patient requires high complexity decision making with multiple systems involvement.  Rounds were done with the Respiratory Therapy Director and Staff therapists and discussed with nursing staff also.  Yevonne Pax, MD [facility] Pulmonary Critical Care Medicine Sleep Medicine

## 2021-01-14 DIAGNOSIS — I87099 Postthrombotic syndrome with other complications of unspecified lower extremity: Secondary | ICD-10-CM | POA: Diagnosis not present

## 2021-01-14 DIAGNOSIS — J159 Unspecified bacterial pneumonia: Secondary | ICD-10-CM | POA: Diagnosis not present

## 2021-01-14 DIAGNOSIS — J9621 Acute and chronic respiratory failure with hypoxia: Secondary | ICD-10-CM | POA: Diagnosis not present

## 2021-01-14 DIAGNOSIS — U071 COVID-19: Secondary | ICD-10-CM | POA: Diagnosis not present

## 2021-01-14 LAB — RENAL FUNCTION PANEL
Albumin: 1.9 g/dL — ABNORMAL LOW (ref 3.5–5.0)
Anion gap: 8 (ref 5–15)
BUN: 16 mg/dL (ref 8–23)
CO2: 34 mmol/L — ABNORMAL HIGH (ref 22–32)
Calcium: 8.6 mg/dL — ABNORMAL LOW (ref 8.9–10.3)
Chloride: 102 mmol/L (ref 98–111)
Creatinine, Ser: 0.53 mg/dL — ABNORMAL LOW (ref 0.61–1.24)
GFR, Estimated: >60 mL/min (ref >60)
Glucose, Bld: 99 mg/dL (ref 70–99)
Phosphorus: 2.5 mg/dL (ref 2.5–4.6)
Potassium: 3.5 mmol/L (ref 3.5–5.1)
Sodium: 144 mmol/L (ref 135–145)

## 2021-01-14 LAB — CBC
HCT: 27.2 % — ABNORMAL LOW (ref 39.0–52.0)
Hemoglobin: 8.2 g/dL — ABNORMAL LOW (ref 13.0–17.0)
MCH: 30.3 pg (ref 26.0–34.0)
MCHC: 30.1 g/dL (ref 30.0–36.0)
MCV: 100.4 fL — ABNORMAL HIGH (ref 80.0–100.0)
Platelets: 234 10*3/uL (ref 150–400)
RBC: 2.71 MIL/uL — ABNORMAL LOW (ref 4.22–5.81)
RDW: 18.7 % — ABNORMAL HIGH (ref 11.5–15.5)
WBC: 9.7 10*3/uL (ref 4.0–10.5)
nRBC: 0 % (ref 0.0–0.2)

## 2021-01-14 LAB — MAGNESIUM: Magnesium: 1.8 mg/dL (ref 1.7–2.4)

## 2021-01-14 NOTE — Progress Notes (Signed)
Pulmonary Critical Care Medicine [facility] [place]   PULMONARY CRITICAL CARE SERVICE  PROGRESS NOTE  Date of Service: 01/14/2021  [name]  ACZ:[id number]  DOB: Jan 18, 1956   DOA: 12/27/2020  Referring Physician: [name], MD  HPI: Scott Roberts is a 65 y.o. adult seen for follow up of Acute on Chronic Respiratory Failure.  At this time patient is still with copious secretions however yesterday was able to do 4 hours of pressure support at some increase in heart rate this morning respiratory therapy is going to reassess  Medications: Reviewed on Rounds  Physical Exam:  Vitals: Temperature 98.7 pulse 80 respiratory 25 blood pressure is 152/89 saturations 100%  Ventilator Settings on assist control FiO2 is 40% tidal volume 450 PEEP 6  . General: Comfortable at this time . Eyes: Grossly normal lids, irises & conjunctiva . ENT: grossly tongue is normal . Neck: no obvious mass . Cardiovascular: S1 S2 normal no gallop . Respiratory: Scattered rhonchi expansion is equal . Abdomen: soft . Skin: no rash seen on limited exam . Musculoskeletal: not rigid . Psychiatric:unable to assess . Neurologic: no seizure no involuntary movements         Lab Data:   Basic Metabolic Panel: Recent Labs  Lab 01/08/21 0339 01/09/21 0724 01/10/21 0655 01/11/21 0636 01/12/21 0550 01/14/21 0414  NA 145  --  148* 145 141 144  K 5.2* 2.9* 3.3* 3.4* 3.8 3.5  CL 105  --  104 100 98 102  CO2 28  --  35* 34* 32 34*  GLUCOSE 115*  --  118* 109* 87 99  BUN 27*  --  16 20 20 16   CREATININE 0.51*  --  0.53* 0.51* 0.46* 0.53*  CALCIUM 8.4*  --  8.7* 8.5* 8.5* 8.6*  MG  --   --  1.6* 1.8 1.8 1.8  PHOS  --   --  2.5 2.4* 2.5 2.5    ABG: Recent Labs  Lab 01/08/21 1635 01/09/21 0445  PHART 7.348* 7.490*  PCO2ART 70.7* 49.8*  PO2ART 114* 96.8  HCO3 37.9* 37.5*  O2SAT 98.3 98.2    Liver Function Tests: Recent Labs  Lab 01/10/21 0655 01/12/21 0550 01/14/21 0414   ALBUMIN 2.1* 2.1* 1.9*   No results for input(s): LIPASE, AMYLASE in the last 168 hours. Recent Labs  Lab 01/10/21 0655  AMMONIA 20    CBC: Recent Labs  Lab 01/09/21 0724 01/10/21 0655 01/11/21 0636 01/12/21 0550 01/14/21 0414  WBC 10.9* 16.9* 10.4 9.4 9.7  HGB 8.7* 8.8* 7.7* 8.7* 8.2*  HCT 27.4* 28.7* 25.8* 27.9* 27.2*  MCV 98.2 100.3* 100.4* 99.6 100.4*  PLT 414* 357 296 256 234    Cardiac Enzymes: No results for input(s): CKTOTAL, CKMB, CKMBINDEX, TROPONINI in the last 168 hours.  BNP (last 3 results) No results for input(s): BNP in the last 8760 hours.  ProBNP (last 3 results) No results for input(s): PROBNP in the last 8760 hours.  Radiological Exams: No results found.  Assessment/Plan Active Problems:   Acute on chronic respiratory failure with hypoxia (HCC)   COVID-19 virus infection   Acute respiratory distress syndrome (ARDS) due to COVID-19 virus [facility])   Healthcare associated bacterial pneumonia   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 Acute on chronic respiratory failure hypoxia we will continue with full support on the ventilator right now if patient looks better this afternoon we will try to do weaning again. 2. COVID-19 virus infection recovery 3.  ARDS secondary to COVID-19 slow improvement 4. Healthcare associated pneumonia treated 5. Chronic DVT treated on anticoagulation   I have personally seen and evaluated the patient, evaluated laboratory and imaging results, formulated the assessment and plan and placed orders. The Patient requires high complexity decision making with multiple systems involvement.  Rounds were done with the Respiratory Therapy Director and Staff therapists and discussed with nursing staff also.  Yevonne Pax, MD [facility] Pulmonary Critical Care Medicine Sleep Medicine

## 2021-01-15 DIAGNOSIS — J159 Unspecified bacterial pneumonia: Secondary | ICD-10-CM | POA: Diagnosis not present

## 2021-01-15 DIAGNOSIS — J9621 Acute and chronic respiratory failure with hypoxia: Secondary | ICD-10-CM | POA: Diagnosis not present

## 2021-01-15 DIAGNOSIS — U071 COVID-19: Secondary | ICD-10-CM | POA: Diagnosis not present

## 2021-01-15 DIAGNOSIS — I87099 Postthrombotic syndrome with other complications of unspecified lower extremity: Secondary | ICD-10-CM | POA: Diagnosis not present

## 2021-01-15 LAB — CBC
HCT: 28.4 % — ABNORMAL LOW (ref 39.0–52.0)
Hemoglobin: 8.6 g/dL — ABNORMAL LOW (ref 13.0–17.0)
MCH: 30.3 pg (ref 26.0–34.0)
MCHC: 30.3 g/dL (ref 30.0–36.0)
MCV: 100 fL (ref 80.0–100.0)
Platelets: 265 10*3/uL (ref 150–400)
RBC: 2.84 MIL/uL — ABNORMAL LOW (ref 4.22–5.81)
RDW: 18.7 % — ABNORMAL HIGH (ref 11.5–15.5)
WBC: 10.4 10*3/uL (ref 4.0–10.5)
nRBC: 0 % (ref 0.0–0.2)

## 2021-01-15 LAB — BASIC METABOLIC PANEL
Anion gap: 12 (ref 5–15)
BUN: 17 mg/dL (ref 8–23)
CO2: 30 mmol/L (ref 22–32)
Calcium: 8.8 mg/dL — ABNORMAL LOW (ref 8.9–10.3)
Chloride: 99 mmol/L (ref 98–111)
Creatinine, Ser: 0.48 mg/dL — ABNORMAL LOW (ref 0.61–1.24)
GFR, Estimated: >60 mL/min (ref >60)
Glucose, Bld: 109 mg/dL — ABNORMAL HIGH (ref 70–99)
Potassium: 3.9 mmol/L (ref 3.5–5.1)
Sodium: 141 mmol/L (ref 135–145)

## 2021-01-15 LAB — PHOSPHORUS: Phosphorus: 2.7 mg/dL (ref 2.5–4.6)

## 2021-01-15 LAB — MAGNESIUM: Magnesium: 1.7 mg/dL (ref 1.7–2.4)

## 2021-01-15 NOTE — Progress Notes (Signed)
Pulmonary Critical Care Medicine [facility] [place]   PULMONARY CRITICAL CARE SERVICE  PROGRESS NOTE  Date of Service: 01/15/2021  Scott Roberts  WER:[id number]  DOB: [birth year]   DOA: 12/27/2020  Referring Physician: [name], MD  HPI: Scott Roberts is a 65 y.o. adult seen for follow up of Acute on Chronic Respiratory Failure.  Patient at this time is on pressure support has been a pressure of 12/6 goal today is for 12 hours  Medications: Reviewed on Rounds  Physical Exam:  Vitals: Temperature is 98.4 pulse 73 respiratory 25 blood pressure is 146/96 saturations 98%  Ventilator Settings on pressure support FiO2 40% pressure 12/6  . General: Comfortable at this time . Eyes: Grossly normal lids, irises & conjunctiva . ENT: grossly tongue is normal . Neck: no obvious mass . Cardiovascular: S1 S2 normal no gallop . Respiratory: Scattered rhonchi coarse breath sound . Abdomen: soft . Skin: no rash seen on limited exam . Musculoskeletal: not rigid . Psychiatric:unable to assess . Neurologic: no seizure no involuntary movements         Lab Data:   Basic Metabolic Panel: Recent Labs  Lab 01/09/21 0724 01/10/21 0655 01/11/21 0636 01/12/21 0550 01/14/21 0414  NA  --  148* 145 141 144  K 2.9* 3.3* 3.4* 3.8 3.5  CL  --  104 100 98 102  CO2  --  35* 34* 32 34*  GLUCOSE  --  118* 109* 87 99  BUN  --  16 20 20 16   CREATININE  --  0.53* 0.51* 0.46* 0.53*  CALCIUM  --  8.7* 8.5* 8.5* 8.6*  MG  --  1.6* 1.8 1.8 1.8  PHOS  --  2.5 2.4* 2.5 2.5    ABG: Recent Labs  Lab 01/08/21 1635 01/09/21 0445  PHART 7.348* 7.490*  PCO2ART 70.7* 49.8*  PO2ART 114* 96.8  HCO3 37.9* 37.5*  O2SAT 98.3 98.2    Liver Function Tests: Recent Labs  Lab 01/10/21 0655 01/12/21 0550 01/14/21 0414  ALBUMIN 2.1* 2.1* 1.9*   No results for input(s): LIPASE, AMYLASE in the last 168 hours. Recent Labs  Lab 01/10/21 0655  AMMONIA 20    CBC: Recent Labs  Lab  01/09/21 0724 01/10/21 0655 01/11/21 0636 01/12/21 0550 01/14/21 0414  WBC 10.9* 16.9* 10.4 9.4 9.7  HGB 8.7* 8.8* 7.7* 8.7* 8.2*  HCT 27.4* 28.7* 25.8* 27.9* 27.2*  MCV 98.2 100.3* 100.4* 99.6 100.4*  PLT 414* 357 296 256 234    Cardiac Enzymes: No results for input(s): CKTOTAL, CKMB, CKMBINDEX, TROPONINI in the last 168 hours.  BNP (last 3 results) No results for input(s): BNP in the last 8760 hours.  ProBNP (last 3 results) No results for input(s): PROBNP in the last 8760 hours.  Radiological Exams: No results found.  Assessment/Plan Active Problems:   Acute on chronic respiratory failure with hypoxia (HCC)   COVID-19 virus infection   Acute respiratory distress syndrome (ARDS) due to COVID-19 virus [facility])   Healthcare associated bacterial pneumonia   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 Acute on chronic respiratory failure hypoxia plan is to continue with weaning 12-hour goal on pressure support 2. COVID-19 virus infection recovery 3. ARDS treated slow improvement 4. Healthcare associated pneumonia treated with antibiotics 5. Chronic DVT treated anticoagulation   I have personally seen and evaluated the patient, evaluated laboratory and imaging results, formulated the assessment and plan and placed orders. The Patient requires high complexity decision making  with multiple systems involvement.  Rounds were done with the Respiratory Therapy Director and Staff therapists and discussed with nursing staff also.  Allyne Gee, MD [facility] Pulmonary Critical Care Medicine Sleep Medicine

## 2021-01-16 DIAGNOSIS — J9621 Acute and chronic respiratory failure with hypoxia: Secondary | ICD-10-CM | POA: Diagnosis not present

## 2021-01-16 DIAGNOSIS — J159 Unspecified bacterial pneumonia: Secondary | ICD-10-CM | POA: Diagnosis not present

## 2021-01-16 DIAGNOSIS — I87099 Postthrombotic syndrome with other complications of unspecified lower extremity: Secondary | ICD-10-CM | POA: Diagnosis not present

## 2021-01-16 DIAGNOSIS — U071 COVID-19: Secondary | ICD-10-CM | POA: Diagnosis not present

## 2021-01-16 LAB — MAGNESIUM: Magnesium: 1.9 mg/dL (ref 1.7–2.4)

## 2021-01-16 NOTE — Progress Notes (Signed)
Pulmonary Critical Care Medicine [facility] [place]   PULMONARY CRITICAL CARE SERVICE  PROGRESS NOTE  Date of Service: 01/16/2021  Scott Roberts  CBJ:[id number]  DOB: May 31, 1956   DOA: 12/27/2020  Referring Physician: [name], MD  HPI: Scott Roberts is a 65 y.o. adult seen for follow up of Acute on Chronic Respiratory Failure.  Patient currently is on full support on assist control was able to do 11 hours of pressure support yesterday we will reassess and try to do pressure support weaning again  Medications: Reviewed on Rounds  Physical Exam:  Vitals: Temperature is 97.6 pulse 76 respiratory rate is 40 blood pressure is 165/96 saturations 100%  Ventilator Settings on assist control FiO2 50% PEEP of 6 tidal volume 450  . General: Comfortable at this time . Eyes: Grossly normal lids, irises & conjunctiva . ENT: grossly tongue is normal . Neck: no obvious mass . Cardiovascular: S1 S2 normal no gallop . Respiratory: Scattered rhonchi coarse breath sounds . Abdomen: soft . Skin: no rash seen on limited exam . Musculoskeletal: not rigid . Psychiatric:unable to assess . Neurologic: no seizure no involuntary movements         Lab Data:   Basic Metabolic Panel: Recent Labs  Lab 01/10/21 0655 01/11/21 0636 01/12/21 0550 01/14/21 0414 01/15/21 1010 01/16/21 0458  NA 148* 145 141 144 141  --   K 3.3* 3.4* 3.8 3.5 3.9  --   CL 104 100 98 102 99  --   CO2 35* 34* 32 34* 30  --   GLUCOSE 118* 109* 87 99 109*  --   BUN 16 20 20 16 17   --   CREATININE 0.53* 0.51* 0.46* 0.53* 0.48*  --   CALCIUM 8.7* 8.5* 8.5* 8.6* 8.8*  --   MG 1.6* 1.8 1.8 1.8 1.7 1.9  PHOS 2.5 2.4* 2.5 2.5 2.7  --     ABG: No results for input(s): PHART, PCO2ART, PO2ART, HCO3, O2SAT in the last 168 hours.  Liver Function Tests: Recent Labs  Lab 01/10/21 0655 01/12/21 0550 01/14/21 0414  ALBUMIN 2.1* 2.1* 1.9*   No results for input(s): LIPASE, AMYLASE in the last 168  hours. Recent Labs  Lab 01/10/21 0655  AMMONIA 20    CBC: Recent Labs  Lab 01/10/21 0655 01/11/21 0636 01/12/21 0550 01/14/21 0414 01/15/21 1010  WBC 16.9* 10.4 9.4 9.7 10.4  HGB 8.8* 7.7* 8.7* 8.2* 8.6*  HCT 28.7* 25.8* 27.9* 27.2* 28.4*  MCV 100.3* 100.4* 99.6 100.4* 100.0  PLT 357 296 256 234 265    Cardiac Enzymes: No results for input(s): CKTOTAL, CKMB, CKMBINDEX, TROPONINI in the last 168 hours.  BNP (last 3 results) No results for input(s): BNP in the last 8760 hours.  ProBNP (last 3 results) No results for input(s): PROBNP in the last 8760 hours.  Radiological Exams: No results found.  Assessment/Plan Active Problems:   Acute on chronic respiratory failure with hypoxia (HCC)   COVID-19 virus infection   Acute respiratory distress syndrome (ARDS) due to COVID-19 virus [facility])   Healthcare associated bacterial pneumonia   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 Acute on chronic respiratory failure hypoxia we will continue with assist control resting mode did pressure support yesterday respiratory therapy will reassess again today and try to start weaning once again 2. COVID-19 virus infection recovery phase we will continue to follow along. 3. Healthcare associated pneumonia has been treated with antibiotics 4. Chronic DVT treated we  will continue to follow along 5. ARDS secondary to COVID-19 in resolution   I have personally seen and evaluated the patient, evaluated laboratory and imaging results, formulated the assessment and plan and placed orders. The Patient requires high complexity decision making with multiple systems involvement.  Rounds were done with the Respiratory Therapy Director and Staff therapists and discussed with nursing staff also.  Yevonne Pax, MD [facility] Pulmonary Critical Care Medicine Sleep Medicine

## 2021-01-18 ENCOUNTER — Other Ambulatory Visit (HOSPITAL_COMMUNITY): Payer: Federal, State, Local not specified - PPO

## 2021-01-18 DIAGNOSIS — U071 COVID-19: Secondary | ICD-10-CM | POA: Diagnosis not present

## 2021-01-18 DIAGNOSIS — J159 Unspecified bacterial pneumonia: Secondary | ICD-10-CM | POA: Diagnosis not present

## 2021-01-18 DIAGNOSIS — J9621 Acute and chronic respiratory failure with hypoxia: Secondary | ICD-10-CM | POA: Diagnosis not present

## 2021-01-18 DIAGNOSIS — I87099 Postthrombotic syndrome with other complications of unspecified lower extremity: Secondary | ICD-10-CM | POA: Diagnosis not present

## 2021-01-18 NOTE — Progress Notes (Signed)
Pulmonary Critical Care Medicine [facility] [place]   PULMONARY CRITICAL CARE SERVICE  PROGRESS NOTE  Date of Service: 01/18/2021  Scott Roberts  [id number]:[id number]  DOB: 11-06-1956   DOA: 12/27/2020  Referring Physician: [name], MD  HPI: Scott Roberts is a 65 y.o. adult seen for follow up of Acute on Chronic Respiratory Failure.  Attempted on pressure support today again did not tolerate remains on assist control  Medications: Reviewed on Rounds  Physical Exam:  Vitals: Temperature is 97.9 pulse 77 respiratory rate is 40 blood pressure is 148/77 saturations 97%  Ventilator Settings on assist control FiO2 40% tidal volume of 450 with a PEEP of 5   General: Comfortable at this time  Eyes: Grossly normal lids, irises & conjunctiva  ENT: grossly tongue is normal  Neck: no obvious mass  Cardiovascular: S1 S2 normal no gallop  Respiratory: No rhonchi coarse breath sounds  Abdomen: soft  Skin: no rash seen on limited exam  Musculoskeletal: not rigid  Psychiatric:unable to assess  Neurologic: no seizure no involuntary movements         Lab Data:   Basic Metabolic Panel: Recent Labs  Lab 01/12/21 0550 01/14/21 0414 01/15/21 1010 01/16/21 0458  NA 141 144 141  --   K 3.8 3.5 3.9  --   CL 98 102 99  --   CO2 32 34* 30  --   GLUCOSE 87 99 109*  --   BUN 20 16 17   --   CREATININE 0.46* 0.53* 0.48*  --   CALCIUM 8.5* 8.6* 8.8*  --   MG 1.8 1.8 1.7 1.9  PHOS 2.5 2.5 2.7  --     ABG: No results for input(s): PHART, PCO2ART, PO2ART, HCO3, O2SAT in the last 168 hours.  Liver Function Tests: Recent Labs  Lab 01/12/21 0550 01/14/21 0414  ALBUMIN 2.1* 1.9*   No results for input(s): LIPASE, AMYLASE in the last 168 hours. No results for input(s): AMMONIA in the last 168 hours.  CBC: Recent Labs  Lab 01/12/21 0550 01/14/21 0414 01/15/21 1010  WBC 9.4 9.7 10.4  HGB 8.7* 8.2* 8.6*  HCT 27.9* 27.2* 28.4*  MCV 99.6 100.4* 100.0  PLT 256  234 265    Cardiac Enzymes: No results for input(s): CKTOTAL, CKMB, CKMBINDEX, TROPONINI in the last 168 hours.  BNP (last 3 results) No results for input(s): BNP in the last 8760 hours.  ProBNP (last 3 results) No results for input(s): PROBNP in the last 8760 hours.  Radiological Exams: DG CHEST PORT 1 VIEW  Result Date: 01/18/2021 CLINICAL DATA:  Pneumonia.  Tracheostomy. EXAM: PORTABLE CHEST 1 VIEW COMPARISON:  01/12/2021. FINDINGS: Tracheostomy tube in stable position. Stable cardiomegaly. Low lung volumes. Severe diffuse bilateral pulmonary infiltrates again noted. Slight progression from prior exam cannot be excluded. Small right pleural effusion. No pneumothorax. Degenerative changes scoliosis thoracic spine. Old right clavicular fracture. IMPRESSION: 1. Tracheostomy tube in stable position. 2. Low lung volumes. Severe diffuse bilateral pulmonary infiltrates again noted. Slight progression from prior exam cannot be excluded. Small right pleural effusion. No pneumothorax. Electronically Signed   By: 01/14/2021  Register   On: 01/18/2021 05:41    Assessment/Plan Active Problems:   Acute on chronic respiratory failure with hypoxia (HCC)   COVID-19 virus infection   Acute respiratory distress syndrome (ARDS) due to COVID-19 virus [facility])   Healthcare associated bacterial pneumonia   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 Acute on chronic respiratory failure  hypoxia we will continue with assist control titrate oxygen continue pulmonary toilet.  Right now is on 40% FiO2. 2. COVID-19 virus infection in recovery we will continue to monitor. 3. ARDS slow to improve chest x-ray still showing infiltrates. 4. Healthcare associated pneumonia again has been treated with antibiotics worsening x-ray though 5. 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 The Patient requires high complexity decision making with multiple systems involvement.  Rounds were done with the Respiratory Therapy Director and Staff therapists and discussed with nursing staff also.  Yevonne Pax, MD [facility] Pulmonary Critical Care Medicine Sleep Medicine

## 2021-01-19 DIAGNOSIS — J9621 Acute and chronic respiratory failure with hypoxia: Secondary | ICD-10-CM | POA: Diagnosis not present

## 2021-01-19 DIAGNOSIS — J159 Unspecified bacterial pneumonia: Secondary | ICD-10-CM | POA: Diagnosis not present

## 2021-01-19 DIAGNOSIS — U071 COVID-19: Secondary | ICD-10-CM | POA: Diagnosis not present

## 2021-01-19 DIAGNOSIS — I87099 Postthrombotic syndrome with other complications of unspecified lower extremity: Secondary | ICD-10-CM | POA: Diagnosis not present

## 2021-01-19 LAB — BASIC METABOLIC PANEL
Anion gap: 12 (ref 5–15)
BUN: 16 mg/dL (ref 8–23)
CO2: 36 mmol/L — ABNORMAL HIGH (ref 22–32)
Calcium: 8.8 mg/dL — ABNORMAL LOW (ref 8.9–10.3)
Chloride: 96 mmol/L — ABNORMAL LOW (ref 98–111)
Creatinine, Ser: 0.52 mg/dL — ABNORMAL LOW (ref 0.61–1.24)
GFR, Estimated: >60 mL/min (ref >60)
Glucose, Bld: 153 mg/dL — ABNORMAL HIGH (ref 70–99)
Potassium: 3 mmol/L — ABNORMAL LOW (ref 3.5–5.1)
Sodium: 144 mmol/L (ref 135–145)

## 2021-01-19 LAB — BLOOD GAS, ARTERIAL
Acid-Base Excess: 12.3 mmol/L — ABNORMAL HIGH (ref 0.0–2.0)
Acid-Base Excess: 13.2 mmol/L — ABNORMAL HIGH (ref 0.0–2.0)
Bicarbonate: 40.2 mmol/L — ABNORMAL HIGH (ref 20.0–28.0)
Bicarbonate: 38.3 mmol/L — ABNORMAL HIGH (ref 20.0–28.0)
FIO2: 100
FIO2: 70
O2 Saturation: 81.4 %
O2 Saturation: 99 %
Patient temperature: 37
Patient temperature: 37
pCO2 arterial: 104 mmHg (ref 32.0–48.0)
pCO2 arterial: 58.4 mmHg — ABNORMAL HIGH (ref 32.0–48.0)
pH, Arterial: 7.214 — ABNORMAL LOW (ref 7.350–7.450)
pH, Arterial: 7.432 (ref 7.350–7.450)
pO2, Arterial: 58 mmHg — ABNORMAL LOW (ref 83.0–108.0)
pO2, Arterial: 131 mmHg — ABNORMAL HIGH (ref 83.0–108.0)

## 2021-01-19 LAB — CBC
HCT: 27.5 % — ABNORMAL LOW (ref 39.0–52.0)
Hemoglobin: 8.1 g/dL — ABNORMAL LOW (ref 13.0–17.0)
MCH: 29.8 pg (ref 26.0–34.0)
MCHC: 29.5 g/dL — ABNORMAL LOW (ref 30.0–36.0)
MCV: 101.1 fL — ABNORMAL HIGH (ref 80.0–100.0)
Platelets: 342 10*3/uL (ref 150–400)
RBC: 2.72 MIL/uL — ABNORMAL LOW (ref 4.22–5.81)
RDW: 18.3 % — ABNORMAL HIGH (ref 11.5–15.5)
WBC: 11.6 10*3/uL — ABNORMAL HIGH (ref 4.0–10.5)
nRBC: 0 % (ref 0.0–0.2)

## 2021-01-19 LAB — MAGNESIUM: Magnesium: 1.7 mg/dL (ref 1.7–2.4)

## 2021-01-19 NOTE — Progress Notes (Signed)
Pulmonary Critical Care Medicine [facility] [place]   PULMONARY CRITICAL CARE SERVICE  PROGRESS NOTE  Date of Service: 01/19/2021  Scott Roberts  MCN:[id number]  DOB: [birth year]   DOA: 12/27/2020  Referring Physician: [name], MD  HPI: Scott Roberts is a 65 y.o. adult seen for follow up of Acute on Chronic Respiratory Failure.  Patient remains on full support on the ventilator.  Has been doing poorly.  Patient is going to have family goals of care meeting  Medications: Reviewed on Rounds  Physical Exam:  Vitals: Temperature is 98.9 pulse 113 respiratory rate is 34 blood pressure is 107/68 saturations at 88%  Ventilator Settings on assist control FiO2 60% tidal volume 500 PEEP of 8  . General: Comfortable at this time . Eyes: Grossly normal lids, irises & conjunctiva . ENT: grossly tongue is normal . Neck: no obvious mass . Cardiovascular: S1 S2 normal no gallop . Respiratory: Coarse rhonchi noted bilaterally . Abdomen: soft . Skin: no rash seen on limited exam . Musculoskeletal: not rigid . Psychiatric:unable to assess . Neurologic: no seizure no involuntary movements         Lab Data:   Basic Metabolic Panel: Recent Labs  Lab 01/14/21 0414 01/15/21 1010 01/16/21 0458 01/19/21 0621  NA 144 141  --  144  K 3.5 3.9  --  3.0*  CL 102 99  --  96*  CO2 34* 30  --  36*  GLUCOSE 99 109*  --  153*  BUN 16 17  --  16  CREATININE 0.53* 0.48*  --  0.52*  CALCIUM 8.6* 8.8*  --  8.8*  MG 1.8 1.7 1.9 1.7  PHOS 2.5 2.7  --   --     ABG: Recent Labs  Lab 01/19/21 0422 01/19/21 0725  PHART 7.214* 7.432  PCO2ART 104* 58.4*  PO2ART 58.0* 131*  HCO3 40.2* 38.3*  O2SAT 81.4 99.0    Liver Function Tests: Recent Labs  Lab 01/14/21 0414  ALBUMIN 1.9*   No results for input(s): LIPASE, AMYLASE in the last 168 hours. No results for input(s): AMMONIA in the last 168 hours.  CBC: Recent Labs  Lab 01/14/21 0414 01/15/21 1010 01/19/21 0621  WBC  9.7 10.4 11.6*  HGB 8.2* 8.6* 8.1*  HCT 27.2* 28.4* 27.5*  MCV 100.4* 100.0 101.1*  PLT 234 265 342    Cardiac Enzymes: No results for input(s): CKTOTAL, CKMB, CKMBINDEX, TROPONINI in the last 168 hours.  BNP (last 3 results) No results for input(s): BNP in the last 8760 hours.  ProBNP (last 3 results) No results for input(s): PROBNP in the last 8760 hours.  Radiological Exams: DG CHEST PORT 1 VIEW  Result Date: 01/18/2021 CLINICAL DATA:  Pneumonia.  Tracheostomy. EXAM: PORTABLE CHEST 1 VIEW COMPARISON:  01/12/2021. FINDINGS: Tracheostomy tube in stable position. Stable cardiomegaly. Low lung volumes. Severe diffuse bilateral pulmonary infiltrates again noted. Slight progression from prior exam cannot be excluded. Small right pleural effusion. No pneumothorax. Degenerative changes scoliosis thoracic spine. Old right clavicular fracture. IMPRESSION: 1. Tracheostomy tube in stable position. 2. Low lung volumes. Severe diffuse bilateral pulmonary infiltrates again noted. Slight progression from prior exam cannot be excluded. Small right pleural effusion. No pneumothorax. Electronically Signed   By: Maisie Fus  Register   On: 01/18/2021 05:41    Assessment/Plan Active Problems:   Acute on chronic respiratory failure with hypoxia (HCC)   COVID-19 virus infection   Acute respiratory distress syndrome (ARDS) due to COVID-19 virus (HCC)  Healthcare associated bacterial pneumonia   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 Acute on chronic respiratory failure with hypoxia we will continue with assist control mode right now is on 60% FiO2 overall not doing well prognosis quite guarded 2. COVID-19 virus infection in recovery but severe disease 3. ARDS supportive care chest x-ray still showing diffuse interstitial infiltrates 4. Healthcare associated pneumonia treated we will continue to follow 5. DVT at baseline   I have personally seen and evaluated the patient,  evaluated laboratory and imaging results, formulated the assessment and plan and placed orders. The Patient requires high complexity decision making with multiple systems involvement.  Rounds were done with the Respiratory Therapy Director and Staff therapists and discussed with nursing staff also.  Yevonne Pax, MD [facility] Pulmonary Critical Care Medicine Sleep Medicine

## 2021-01-20 DIAGNOSIS — J9621 Acute and chronic respiratory failure with hypoxia: Secondary | ICD-10-CM | POA: Diagnosis not present

## 2021-01-20 DIAGNOSIS — J159 Unspecified bacterial pneumonia: Secondary | ICD-10-CM | POA: Diagnosis not present

## 2021-01-20 DIAGNOSIS — U071 COVID-19: Secondary | ICD-10-CM | POA: Diagnosis not present

## 2021-01-20 DIAGNOSIS — I87099 Postthrombotic syndrome with other complications of unspecified lower extremity: Secondary | ICD-10-CM | POA: Diagnosis not present

## 2021-01-20 LAB — MAGNESIUM: Magnesium: 2 mg/dL (ref 1.7–2.4)

## 2021-01-20 LAB — POTASSIUM: Potassium: 3.5 mmol/L (ref 3.5–5.1)

## 2021-01-20 NOTE — Progress Notes (Signed)
Pulmonary Critical Care Medicine [facility] [place]   PULMONARY CRITICAL CARE SERVICE  PROGRESS NOTE  Date of Service: 01/20/2021  Scott Roberts  [id number]  DOB: [birth year]   DOA: 12/27/2020  Referring Physician: [name], MD  HPI: Scott Roberts is a 65 y.o. adult seen for follow up of Acute on Chronic Respiratory Failure.  Patient currently is on assist control mode has been on 50% FiO2  Medications: Reviewed on Rounds  Physical Exam:  Vitals: Temperature is 97.2 pulse 91 respiratory 29 blood pressure is 136/77 saturations 99%  Ventilator Settings on assist control FiO2 50% tidal volume is 500 PEEP of 6  . General: Comfortable at this time . Eyes: Grossly normal lids, irises & conjunctiva . ENT: grossly tongue is normal . Neck: no obvious mass . Cardiovascular: S1 S2 normal no gallop . Respiratory: Scattered rhonchi expansion is equal . Abdomen: soft . Skin: no rash seen on limited exam . Musculoskeletal: not rigid . Psychiatric:unable to assess . Neurologic: no seizure no involuntary movements         Lab Data:   Basic Metabolic Panel: Recent Labs  Lab 01/14/21 0414 01/15/21 1010 01/16/21 0458 01/19/21 0621 01/20/21 0532  NA 144 141  --  144  --   K 3.5 3.9  --  3.0* 3.5  CL 102 99  --  96*  --   CO2 34* 30  --  36*  --   GLUCOSE 99 109*  --  153*  --   BUN 16 17  --  16  --   CREATININE 0.53* 0.48*  --  0.52*  --   CALCIUM 8.6* 8.8*  --  8.8*  --   MG 1.8 1.7 1.9 1.7 2.0  PHOS 2.5 2.7  --   --   --     ABG: Recent Labs  Lab 01/19/21 0422 01/19/21 0725  PHART 7.214* 7.432  PCO2ART 104* 58.4*  PO2ART 58.0* 131*  HCO3 40.2* 38.3*  O2SAT 81.4 99.0    Liver Function Tests: Recent Labs  Lab 01/14/21 0414  ALBUMIN 1.9*   No results for input(s): LIPASE, AMYLASE in the last 168 hours. No results for input(s): AMMONIA in the last 168 hours.  CBC: Recent Labs  Lab 01/14/21 0414 01/15/21 1010 01/19/21 0621  WBC 9.7 10.4  11.6*  HGB 8.2* 8.6* 8.1*  HCT 27.2* 28.4* 27.5*  MCV 100.4* 100.0 101.1*  PLT 234 265 342    Cardiac Enzymes: No results for input(s): CKTOTAL, CKMB, CKMBINDEX, TROPONINI in the last 168 hours.  BNP (last 3 results) No results for input(s): BNP in the last 8760 hours.  ProBNP (last 3 results) No results for input(s): PROBNP in the last 8760 hours.  Radiological Exams: No results found.  Assessment/Plan Active Problems:   Acute on chronic respiratory failure with hypoxia (HCC)   COVID-19 virus infection   Acute respiratory distress syndrome (ARDS) due to COVID-19 virus [facility])   Healthcare associated bacterial pneumonia   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 Acute on chronic respiratory failure hypoxia we will continue with assist control titrate oxygen down as tolerated 2. COVID-19 virus infection severe disease continue with supportive care slow to improve 3. ARDS treated we will continue to follow along. 4. Healthcare associated pneumonia treated we will continue to follow along. 5. Chronic DVT has been treated   I have personally seen and evaluated the patient, evaluated laboratory and imaging results, formulated the assessment and  plan and placed orders. The Patient requires high complexity decision making with multiple systems involvement.  Rounds were done with the Respiratory Therapy Director and Staff therapists and discussed with nursing staff also.  Allyne Gee, MD [facility] Pulmonary Critical Care Medicine Sleep Medicine

## 2021-01-21 DIAGNOSIS — J159 Unspecified bacterial pneumonia: Secondary | ICD-10-CM | POA: Diagnosis not present

## 2021-01-21 DIAGNOSIS — U071 COVID-19: Secondary | ICD-10-CM | POA: Diagnosis not present

## 2021-01-21 DIAGNOSIS — I87099 Postthrombotic syndrome with other complications of unspecified lower extremity: Secondary | ICD-10-CM | POA: Diagnosis not present

## 2021-01-21 DIAGNOSIS — J9621 Acute and chronic respiratory failure with hypoxia: Secondary | ICD-10-CM | POA: Diagnosis not present

## 2021-01-21 LAB — CULTURE, RESPIRATORY W GRAM STAIN

## 2021-01-21 NOTE — Progress Notes (Signed)
Pulmonary Critical Care Medicine [facility] [place]   PULMONARY CRITICAL CARE SERVICE  PROGRESS NOTE  Date of Service: 01/21/2021  Scott Roberts  [id number]  DOB: 03-20-1956   DOA: 12/27/2020  Referring Physician: [name], MD  HPI: Scott Roberts is a 65 y.o. adult seen for follow up of Acute on Chronic Respiratory Failure.  Patient at this time is on assist control has been on 45% FiO2 good saturations are noted  Medications: Reviewed on Rounds  Physical Exam:  Vitals: Temperature is 99.0 pulse 86 respiratory rate 27 blood pressure is 144/81 saturations 97  Ventilator Settings on assist control FiO2 45% tidal volume is 500 PEEP 5  . General: Comfortable at this time . Eyes: Grossly normal lids, irises & conjunctiva . ENT: grossly tongue is normal . Neck: no obvious mass . Cardiovascular: S1 S2 normal no gallop . Respiratory: Coarse breath sounds with a few scattered rhonchi . Abdomen: soft . Skin: no rash seen on limited exam . Musculoskeletal: not rigid . Psychiatric:unable to assess . Neurologic: no seizure no involuntary movements         Lab Data:   Basic Metabolic Panel: Recent Labs  Lab 01/15/21 1010 01/16/21 0458 01/19/21 0621 01/20/21 0532  NA 141  --  144  --   K 3.9  --  3.0* 3.5  CL 99  --  96*  --   CO2 30  --  36*  --   GLUCOSE 109*  --  153*  --   BUN 17  --  16  --   CREATININE 0.48*  --  0.52*  --   CALCIUM 8.8*  --  8.8*  --   MG 1.7 1.9 1.7 2.0  PHOS 2.7  --   --   --     ABG: Recent Labs  Lab 01/19/21 0422 01/19/21 0725  PHART 7.214* 7.432  PCO2ART 104* 58.4*  PO2ART 58.0* 131*  HCO3 40.2* 38.3*  O2SAT 81.4 99.0    Liver Function Tests: No results for input(s): AST, ALT, ALKPHOS, BILITOT, PROT, ALBUMIN in the last 168 hours. No results for input(s): LIPASE, AMYLASE in the last 168 hours. No results for input(s): AMMONIA in the last 168 hours.  CBC: Recent Labs  Lab 01/15/21 1010 01/19/21 0621  WBC  10.4 11.6*  HGB 8.6* 8.1*  HCT 28.4* 27.5*  MCV 100.0 101.1*  PLT 265 342    Cardiac Enzymes: No results for input(s): CKTOTAL, CKMB, CKMBINDEX, TROPONINI in the last 168 hours.  BNP (last 3 results) No results for input(s): BNP in the last 8760 hours.  ProBNP (last 3 results) No results for input(s): PROBNP in the last 8760 hours.  Radiological Exams: No results found.  Assessment/Plan Active Problems:   Acute on chronic respiratory failure with hypoxia (HCC)   COVID-19 virus infection   Acute respiratory distress syndrome (ARDS) due to COVID-19 virus [facility])   Healthcare associated bacterial pneumonia   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 Acute on chronic respiratory failure with hypoxia we will continue with assist control mode titrate oxygen continue pulmonary toilet. 2. COVID-19 virus infection in recovery we will continue to follow 3. ARDS treated slowly improving 4. Healthcare associated pneumonia we will continue to follow 5. Chronic DVT treated   I have personally seen and evaluated the patient, evaluated laboratory and imaging results, formulated the assessment and plan and placed orders. The Patient requires high complexity decision making with multiple systems involvement.  Rounds were done with the Respiratory Therapy Director and Staff therapists and discussed with nursing staff also.  Allyne Gee, MD [facility] Pulmonary Critical Care Medicine Sleep Medicine

## 2021-01-22 DIAGNOSIS — U071 COVID-19: Secondary | ICD-10-CM | POA: Diagnosis not present

## 2021-01-22 DIAGNOSIS — I87099 Postthrombotic syndrome with other complications of unspecified lower extremity: Secondary | ICD-10-CM | POA: Diagnosis not present

## 2021-01-22 DIAGNOSIS — J159 Unspecified bacterial pneumonia: Secondary | ICD-10-CM | POA: Diagnosis not present

## 2021-01-22 DIAGNOSIS — J9621 Acute and chronic respiratory failure with hypoxia: Secondary | ICD-10-CM | POA: Diagnosis not present

## 2021-01-22 LAB — CBC
HCT: 26 % — ABNORMAL LOW (ref 39.0–52.0)
Hemoglobin: 7.7 g/dL — ABNORMAL LOW (ref 13.0–17.0)
MCH: 29.8 pg (ref 26.0–34.0)
MCHC: 29.6 g/dL — ABNORMAL LOW (ref 30.0–36.0)
MCV: 100.8 fL — ABNORMAL HIGH (ref 80.0–100.0)
Platelets: 295 10*3/uL (ref 150–400)
RBC: 2.58 MIL/uL — ABNORMAL LOW (ref 4.22–5.81)
RDW: 17.6 % — ABNORMAL HIGH (ref 11.5–15.5)
WBC: 7.9 10*3/uL (ref 4.0–10.5)
nRBC: 0 % (ref 0.0–0.2)

## 2021-01-22 LAB — BASIC METABOLIC PANEL
Anion gap: 11 (ref 5–15)
BUN: 15 mg/dL (ref 8–23)
CO2: 33 mmol/L — ABNORMAL HIGH (ref 22–32)
Calcium: 8.5 mg/dL — ABNORMAL LOW (ref 8.9–10.3)
Chloride: 97 mmol/L — ABNORMAL LOW (ref 98–111)
Creatinine, Ser: 0.54 mg/dL — ABNORMAL LOW (ref 0.61–1.24)
GFR, Estimated: >60 mL/min (ref >60)
Glucose, Bld: 105 mg/dL — ABNORMAL HIGH (ref 70–99)
Potassium: 3.7 mmol/L (ref 3.5–5.1)
Sodium: 141 mmol/L (ref 135–145)

## 2021-01-22 LAB — MAGNESIUM: Magnesium: 1.8 mg/dL (ref 1.7–2.4)

## 2021-01-22 NOTE — Progress Notes (Signed)
Pulmonary Critical Care Medicine [facility] [place]   PULMONARY CRITICAL CARE SERVICE  PROGRESS NOTE  Date of Service: 01/22/2021  Scott Roberts  [id number]  DOB: [birth year]   DOA: 12/27/2020  Referring Physician: [name], MD  HPI: Scott Roberts is a 65 y.o. adult seen for follow up of Acute on Chronic Respiratory Failure.  On assist control currently on 45% FiO2 good saturations are noted  Medications: Reviewed on Rounds  Physical Exam:  Vitals: Temperature is 98.7 pulse 86 respiratory rate 25 blood pressure is 119/72 saturations 97%  Ventilator Settings on assist control FiO2 is 45% tidal volume is 500 with a PEEP of 5  . General: Comfortable at this time . Eyes: Grossly normal lids, irises & conjunctiva . ENT: grossly tongue is normal . Neck: no obvious mass . Cardiovascular: S1 S2 normal no gallop . Respiratory: Scattered rhonchi very coarse breath sounds . Abdomen: soft . Skin: no rash seen on limited exam . Musculoskeletal: not rigid . Psychiatric:unable to assess . Neurologic: no seizure no involuntary movements         Lab Data:   Basic Metabolic Panel: Recent Labs  Lab 01/16/21 0458 01/19/21 0621 01/20/21 0532 01/22/21 0450  NA  --  144  --  141  K  --  3.0* 3.5 3.7  CL  --  96*  --  97*  CO2  --  36*  --  33*  GLUCOSE  --  153*  --  105*  BUN  --  16  --  15  CREATININE  --  0.52*  --  0.54*  CALCIUM  --  8.8*  --  8.5*  MG 1.9 1.7 2.0 1.8    ABG: Recent Labs  Lab 01/19/21 0422 01/19/21 0725  PHART 7.214* 7.432  PCO2ART 104* 58.4*  PO2ART 58.0* 131*  HCO3 40.2* 38.3*  O2SAT 81.4 99.0    Liver Function Tests: No results for input(s): AST, ALT, ALKPHOS, BILITOT, PROT, ALBUMIN in the last 168 hours. No results for input(s): LIPASE, AMYLASE in the last 168 hours. No results for input(s): AMMONIA in the last 168 hours.  CBC: Recent Labs  Lab 01/19/21 0621 01/22/21 0644  WBC 11.6* 7.9  HGB 8.1* 7.7*  HCT 27.5*  26.0*  MCV 101.1* 100.8*  PLT 342 295    Cardiac Enzymes: No results for input(s): CKTOTAL, CKMB, CKMBINDEX, TROPONINI in the last 168 hours.  BNP (last 3 results) No results for input(s): BNP in the last 8760 hours.  ProBNP (last 3 results) No results for input(s): PROBNP in the last 8760 hours.  Radiological Exams: No results found.  Assessment/Plan Active Problems:   Acute on chronic respiratory failure with hypoxia (HCC)   COVID-19 virus infection   Acute respiratory distress syndrome (ARDS) due to COVID-19 virus [facility])   Healthcare associated bacterial pneumonia   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 Acute on chronic respiratory failure hypoxia we will continue with ventilator support.  Respiratory therapy will assess weaning parameters once again 2. COVID-19 virus infection in recovery 3. 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 Healthcare associated pneumonia has been treated 5. Chronic DVT treated slowly improve   I have personally seen and evaluated the patient, evaluated laboratory and imaging results, formulated the assessment and plan and placed orders. The Patient requires high complexity decision making with multiple systems involvement.  Rounds were done with the Respiratory Therapy Director and Staff therapists and discussed with nursing  staff also.  Allyne Gee, MD [facility] Pulmonary Critical Care Medicine Sleep Medicine

## 2021-01-23 DIAGNOSIS — J9621 Acute and chronic respiratory failure with hypoxia: Secondary | ICD-10-CM | POA: Diagnosis not present

## 2021-01-23 DIAGNOSIS — U071 COVID-19: Secondary | ICD-10-CM | POA: Diagnosis not present

## 2021-01-23 DIAGNOSIS — I87099 Postthrombotic syndrome with other complications of unspecified lower extremity: Secondary | ICD-10-CM | POA: Diagnosis not present

## 2021-01-23 DIAGNOSIS — J159 Unspecified bacterial pneumonia: Secondary | ICD-10-CM | POA: Diagnosis not present

## 2021-01-23 LAB — CBC
HCT: 26.3 % — ABNORMAL LOW (ref 39.0–52.0)
Hemoglobin: 8.2 g/dL — ABNORMAL LOW (ref 13.0–17.0)
MCH: 31.1 pg (ref 26.0–34.0)
MCHC: 31.2 g/dL (ref 30.0–36.0)
MCV: 99.6 fL (ref 80.0–100.0)
Platelets: 303 10*3/uL (ref 150–400)
RBC: 2.64 MIL/uL — ABNORMAL LOW (ref 4.22–5.81)
RDW: 17.5 % — ABNORMAL HIGH (ref 11.5–15.5)
WBC: 9 10*3/uL (ref 4.0–10.5)
nRBC: 2.1 % — ABNORMAL HIGH (ref 0.0–0.2)

## 2021-01-23 NOTE — Progress Notes (Signed)
Pulmonary Critical Care Medicine [facility] [place]   PULMONARY CRITICAL CARE SERVICE  PROGRESS NOTE  Date of Service: 01/23/2021  Scott Roberts  [id number]  DOB: 1956/05/26   DOA: 12/27/2020  Referring Physician: [name], MD  HPI: Scott Roberts is a 65 y.o. adult seen for follow up of Acute on Chronic Respiratory Failure.  Patient currently is on assist control has been on 40% FiO2 not tolerating weaning at all  Medications: Reviewed on Rounds  Physical Exam:  Vitals: Temperature 98.2 pulse 74 respiratory rate is 23 blood pressure is 132/80 saturations 98%  Ventilator Settings on assist control FiO2 is 40% tidal volume 500 PEEP of 5  . General: Comfortable at this time . Eyes: Grossly normal lids, irises & conjunctiva . ENT: grossly tongue is normal . Neck: no obvious mass . Cardiovascular: S1 S2 normal no gallop . Respiratory: No rhonchi very coarse breath sound . Abdomen: soft . Skin: no rash seen on limited exam . Musculoskeletal: not rigid . Psychiatric:unable to assess . Neurologic: no seizure no involuntary movements         Lab Data:   Basic Metabolic Panel: Recent Labs  Lab 01/19/21 0621 01/20/21 0532 01/22/21 0450  NA 144  --  141  K 3.0* 3.5 3.7  CL 96*  --  97*  CO2 36*  --  33*  GLUCOSE 153*  --  105*  BUN 16  --  15  CREATININE 0.52*  --  0.54*  CALCIUM 8.8*  --  8.5*  MG 1.7 2.0 1.8    ABG: Recent Labs  Lab 01/19/21 0422 01/19/21 0725  PHART 7.214* 7.432  PCO2ART 104* 58.4*  PO2ART 58.0* 131*  HCO3 40.2* 38.3*  O2SAT 81.4 99.0    Liver Function Tests: No results for input(s): AST, ALT, ALKPHOS, BILITOT, PROT, ALBUMIN in the last 168 hours. No results for input(s): LIPASE, AMYLASE in the last 168 hours. No results for input(s): AMMONIA in the last 168 hours.  CBC: Recent Labs  Lab 01/19/21 0621 01/22/21 0644 01/23/21 0409  WBC 11.6* 7.9 9.0  HGB 8.1* 7.7* 8.2*  HCT 27.5* 26.0* 26.3*  MCV 101.1* 100.8*  99.6  PLT 342 295 303    Cardiac Enzymes: No results for input(s): CKTOTAL, CKMB, CKMBINDEX, TROPONINI in the last 168 hours.  BNP (last 3 results) No results for input(s): BNP in the last 8760 hours.  ProBNP (last 3 results) No results for input(s): PROBNP in the last 8760 hours.  Radiological Exams: No results found.  Assessment/Plan Active Problems:   Acute on chronic respiratory failure with hypoxia (HCC)   COVID-19 virus infection   Acute respiratory distress syndrome (ARDS) due to COVID-19 virus [facility])   Healthcare associated bacterial pneumonia   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 Acute on chronic respiratory failure hypoxia we will continue with the weaning process as tolerated however so far patient does not appear to be doing well with weaning 2. COVID-19 virus infection in recovery we will continue with supportive care 3. ARDS treated slow improvement 4. Healthcare associated pneumonia treated with antibiotics 5. DVT has been on anticoagulation   I have personally seen and evaluated the patient, evaluated laboratory and imaging results, formulated the assessment and plan and placed orders. The Patient requires high complexity decision making with multiple systems involvement.  Rounds were done with the Respiratory Therapy Director and Staff therapists and discussed with nursing staff also.  Yevonne Pax, MD [facility]  Pulmonary Critical Care Medicine Sleep Medicine

## 2021-01-24 ENCOUNTER — Other Ambulatory Visit (HOSPITAL_COMMUNITY): Payer: Federal, State, Local not specified - PPO

## 2021-01-24 DIAGNOSIS — J159 Unspecified bacterial pneumonia: Secondary | ICD-10-CM | POA: Diagnosis not present

## 2021-01-24 DIAGNOSIS — J9621 Acute and chronic respiratory failure with hypoxia: Secondary | ICD-10-CM | POA: Diagnosis not present

## 2021-01-24 DIAGNOSIS — I87099 Postthrombotic syndrome with other complications of unspecified lower extremity: Secondary | ICD-10-CM | POA: Diagnosis not present

## 2021-01-24 DIAGNOSIS — U071 COVID-19: Secondary | ICD-10-CM | POA: Diagnosis not present

## 2021-01-24 LAB — BASIC METABOLIC PANEL
Anion gap: 10 (ref 5–15)
BUN: 15 mg/dL (ref 8–23)
CO2: 33 mmol/L — ABNORMAL HIGH (ref 22–32)
Calcium: 8.7 mg/dL — ABNORMAL LOW (ref 8.9–10.3)
Chloride: 98 mmol/L (ref 98–111)
Creatinine, Ser: 0.5 mg/dL — ABNORMAL LOW (ref 0.61–1.24)
GFR, Estimated: >60 mL/min (ref >60)
Glucose, Bld: 117 mg/dL — ABNORMAL HIGH (ref 70–99)
Potassium: 3.6 mmol/L (ref 3.5–5.1)
Sodium: 141 mmol/L (ref 135–145)

## 2021-01-24 LAB — CBC
HCT: 26.8 % — ABNORMAL LOW (ref 39.0–52.0)
Hemoglobin: 7.9 g/dL — ABNORMAL LOW (ref 13.0–17.0)
MCH: 29.4 pg (ref 26.0–34.0)
MCHC: 29.5 g/dL — ABNORMAL LOW (ref 30.0–36.0)
MCV: 99.6 fL (ref 80.0–100.0)
Platelets: 305 10*3/uL (ref 150–400)
RBC: 2.69 MIL/uL — ABNORMAL LOW (ref 4.22–5.81)
RDW: 17.3 % — ABNORMAL HIGH (ref 11.5–15.5)
WBC: 10.9 10*3/uL — ABNORMAL HIGH (ref 4.0–10.5)
nRBC: 0.2 % (ref 0.0–0.2)

## 2021-01-24 LAB — PHOSPHORUS: Phosphorus: 2.3 mg/dL — ABNORMAL LOW (ref 2.5–4.6)

## 2021-01-24 LAB — MAGNESIUM: Magnesium: 1.7 mg/dL (ref 1.7–2.4)

## 2021-01-24 NOTE — Progress Notes (Signed)
Pulmonary Critical Care Medicine [facility] [place]   PULMONARY CRITICAL CARE SERVICE  PROGRESS NOTE  Date of Service: 01/24/2021  Scott Roberts  JJK:[id number]  DOB: 1956-04-26   DOA: 12/27/2020  Referring Physician: [name], MD  HPI: Scott Roberts is a 65 y.o. adult seen for follow up of Acute on Chronic Respiratory Failure.  Patient currently is on assist control on 40% FiO2 good saturations are noted  Medications: Reviewed on Rounds  Physical Exam:  Vitals: Temperature is 98.7 pulse 89 respiratory rate is 30 blood pressure is 143/81 saturations 98%  Ventilator Settings on assist control FiO2 is 40% tidal volume 500 with a PEEP of 5  . General: Comfortable at this time . Eyes: Grossly normal lids, irises & conjunctiva . ENT: grossly tongue is normal . Neck: no obvious mass . Cardiovascular: S1 S2 normal no gallop . Respiratory: Coarse rhonchi expansion is equal . Abdomen: soft . Skin: no rash seen on limited exam . Musculoskeletal: not rigid . Psychiatric:unable to assess . Neurologic: no seizure no involuntary movements         Lab Data:   Basic Metabolic Panel: Recent Labs  Lab 01/19/21 0621 01/20/21 0532 01/22/21 0450 01/24/21 0454  NA 144  --  141 141  K 3.0* 3.5 3.7 3.6  CL 96*  --  97* 98  CO2 36*  --  33* 33*  GLUCOSE 153*  --  105* 117*  BUN 16  --  15 15  CREATININE 0.52*  --  0.54* 0.50*  CALCIUM 8.8*  --  8.5* 8.7*  MG 1.7 2.0 1.8 1.7  PHOS  --   --   --  2.3*    ABG: Recent Labs  Lab 01/19/21 0422 01/19/21 0725  PHART 7.214* 7.432  PCO2ART 104* 58.4*  PO2ART 58.0* 131*  HCO3 40.2* 38.3*  O2SAT 81.4 99.0    Liver Function Tests: No results for input(s): AST, ALT, ALKPHOS, BILITOT, PROT, ALBUMIN in the last 168 hours. No results for input(s): LIPASE, AMYLASE in the last 168 hours. No results for input(s): AMMONIA in the last 168 hours.  CBC: Recent Labs  Lab 01/19/21 0621 01/22/21 0644 01/23/21 0409  01/24/21 0454  WBC 11.6* 7.9 9.0 10.9*  HGB 8.1* 7.7* 8.2* 7.9*  HCT 27.5* 26.0* 26.3* 26.8*  MCV 101.1* 100.8* 99.6 99.6  PLT 342 295 303 305    Cardiac Enzymes: No results for input(s): CKTOTAL, CKMB, CKMBINDEX, TROPONINI in the last 168 hours.  BNP (last 3 results) No results for input(s): BNP in the last 8760 hours.  ProBNP (last 3 results) No results for input(s): PROBNP in the last 8760 hours.  Radiological Exams: DG Chest Port 1 View  Result Date: 01/24/2021 CLINICAL DATA:  Pneumonia.  ARDS.  COVID. EXAM: PORTABLE CHEST 1 VIEW COMPARISON:  01/18/2021. FINDINGS: Tracheostomy tube in stable position. State views bilateral pulmonary infiltrates/edema again noted without interim change. Small right pleural effusion again noted. No pneumothorax. Old right clavicular fracture again noted. IMPRESSION: 1. Tracheostomy tube in stable position. 2. Bilateral pulmonary infiltrates/edema again noted. Electronically Signed   By: Maisie Fus  Register   On: 01/24/2021 06:22    Assessment/Plan Active Problems:   Acute on chronic respiratory failure with hypoxia (HCC)   COVID-19 virus infection   Acute respiratory distress syndrome (ARDS) due to COVID-19 virus [facility])   Healthcare associated bacterial pneumonia   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 Acute on chronic respiratory failure hypoxia we  will continue with assist control titrate oxygen continue pulmonary toilet. 2. COVID-19 virus infection recovery 3. ARDS treated slow improvement 4. Healthcare associated pneumonia slowly improving 5. Chronic DVT treated   I have personally seen and evaluated the patient, evaluated laboratory and imaging results, formulated the assessment and plan and placed orders. The Patient requires high complexity decision making with multiple systems involvement.  Rounds were done with the Respiratory Therapy Director and Staff therapists and discussed with nursing staff  also.  Yevonne Pax, MD [facility] Pulmonary Critical Care Medicine Sleep Medicine

## 2021-01-25 DIAGNOSIS — I87099 Postthrombotic syndrome with other complications of unspecified lower extremity: Secondary | ICD-10-CM | POA: Diagnosis not present

## 2021-01-25 DIAGNOSIS — J9621 Acute and chronic respiratory failure with hypoxia: Secondary | ICD-10-CM | POA: Diagnosis not present

## 2021-01-25 DIAGNOSIS — U071 COVID-19: Secondary | ICD-10-CM | POA: Diagnosis not present

## 2021-01-25 DIAGNOSIS — J159 Unspecified bacterial pneumonia: Secondary | ICD-10-CM | POA: Diagnosis not present

## 2021-01-25 LAB — CBC
HCT: 24.8 % — ABNORMAL LOW (ref 39.0–52.0)
Hemoglobin: 7.5 g/dL — ABNORMAL LOW (ref 13.0–17.0)
MCH: 29.4 pg (ref 26.0–34.0)
MCHC: 30.2 g/dL (ref 30.0–36.0)
MCV: 97.3 fL (ref 80.0–100.0)
Platelets: 334 10*3/uL (ref 150–400)
RBC: 2.55 MIL/uL — ABNORMAL LOW (ref 4.22–5.81)
RDW: 17.2 % — ABNORMAL HIGH (ref 11.5–15.5)
WBC: 9.1 10*3/uL (ref 4.0–10.5)
nRBC: 0.2 % (ref 0.0–0.2)

## 2021-01-25 LAB — RENAL FUNCTION PANEL
Albumin: 1.8 g/dL — ABNORMAL LOW (ref 3.5–5.0)
Anion gap: 9 (ref 5–15)
BUN: 18 mg/dL (ref 8–23)
CO2: 34 mmol/L — ABNORMAL HIGH (ref 22–32)
Calcium: 8.6 mg/dL — ABNORMAL LOW (ref 8.9–10.3)
Chloride: 98 mmol/L (ref 98–111)
Creatinine, Ser: 0.55 mg/dL — ABNORMAL LOW (ref 0.61–1.24)
GFR, Estimated: >60 mL/min (ref >60)
Glucose, Bld: 102 mg/dL — ABNORMAL HIGH (ref 70–99)
Phosphorus: 2.1 mg/dL — ABNORMAL LOW (ref 2.5–4.6)
Potassium: 3.6 mmol/L (ref 3.5–5.1)
Sodium: 141 mmol/L (ref 135–145)

## 2021-01-25 LAB — MAGNESIUM: Magnesium: 2 mg/dL (ref 1.7–2.4)

## 2021-01-25 NOTE — Progress Notes (Signed)
Pulmonary Critical Care Medicine [facility] [place]   PULMONARY CRITICAL CARE SERVICE  PROGRESS NOTE  Date of Service: 01/25/2021  Scott Roberts  DPO:[id number]  DOB: 1956-03-04   DOA: 12/27/2020  Referring Physician: [name], MD  HPI: Scott Roberts is a 65 y.o. adult seen for follow up of Acute on Chronic Respiratory Failure.  Currently patient is on assist control has been on 35% FiO2 was attempted on pressure support but has been continuously failing  Medications: Reviewed on Rounds  Physical Exam:  Vitals: Temperature is 98.6 pulse 76 respiratory 25 blood pressure is 141/77 saturations 100%  Ventilator Settings on assist control FiO2 35% tidal volume 500 PEEP 5  . General: Comfortable at this time . Eyes: Grossly normal lids, irises & conjunctiva . ENT: grossly tongue is normal . Neck: no obvious mass . Cardiovascular: S1 S2 normal no gallop . Respiratory: No rhonchi very coarse breath sound . Abdomen: soft . Skin: no rash seen on limited exam . Musculoskeletal: not rigid . Psychiatric:unable to assess . Neurologic: no seizure no involuntary movements         Lab Data:   Basic Metabolic Panel: Recent Labs  Lab 01/19/21 0621 01/20/21 0532 01/22/21 0450 01/24/21 0454 01/25/21 0350  NA 144  --  141 141 141  K 3.0* 3.5 3.7 3.6 3.6  CL 96*  --  97* 98 98  CO2 36*  --  33* 33* 34*  GLUCOSE 153*  --  105* 117* 102*  BUN 16  --  15 15 18   CREATININE 0.52*  --  0.54* 0.50* 0.55*  CALCIUM 8.8*  --  8.5* 8.7* 8.6*  MG 1.7 2.0 1.8 1.7 2.0  PHOS  --   --   --  2.3* 2.1*    ABG: Recent Labs  Lab 01/19/21 0422 01/19/21 0725  PHART 7.214* 7.432  PCO2ART 104* 58.4*  PO2ART 58.0* 131*  HCO3 40.2* 38.3*  O2SAT 81.4 99.0    Liver Function Tests: Recent Labs  Lab 01/25/21 0350  ALBUMIN 1.8*   No results for input(s): LIPASE, AMYLASE in the last 168 hours. No results for input(s): AMMONIA in the last 168 hours.  CBC: Recent Labs  Lab  01/19/21 0621 01/22/21 0644 01/23/21 0409 01/24/21 0454 01/25/21 0350  WBC 11.6* 7.9 9.0 10.9* 9.1  HGB 8.1* 7.7* 8.2* 7.9* 7.5*  HCT 27.5* 26.0* 26.3* 26.8* 24.8*  MCV 101.1* 100.8* 99.6 99.6 97.3  PLT 342 295 303 305 334    Cardiac Enzymes: No results for input(s): CKTOTAL, CKMB, CKMBINDEX, TROPONINI in the last 168 hours.  BNP (last 3 results) No results for input(s): BNP in the last 8760 hours.  ProBNP (last 3 results) No results for input(s): PROBNP in the last 8760 hours.  Radiological Exams: DG Chest Port 1 View  Result Date: 01/24/2021 CLINICAL DATA:  Pneumonia.  ARDS.  COVID. EXAM: PORTABLE CHEST 1 VIEW COMPARISON:  01/18/2021. FINDINGS: Tracheostomy tube in stable position. State views bilateral pulmonary infiltrates/edema again noted without interim change. Small right pleural effusion again noted. No pneumothorax. Old right clavicular fracture again noted. IMPRESSION: 1. Tracheostomy tube in stable position. 2. Bilateral pulmonary infiltrates/edema again noted. Electronically Signed   By: 01/20/2021  Register   On: 01/24/2021 06:22    Assessment/Plan Active Problems:   Acute on chronic respiratory failure with hypoxia (HCC)   COVID-19 virus infection   Acute respiratory distress syndrome (ARDS) due to COVID-19 virus [facility])   Healthcare associated bacterial pneumonia  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 Acute on chronic respiratory failure hypoxia we will continue with the ventilator and full support not really tolerating weaning and probably is not going to be a candidate for weaning 2. COVID-19 virus infection in recovery 3. ARDS treated slow improvement 4. Healthcare associated pneumonia treated with improvement 5. DVT we will continue to monitor   I have personally seen and evaluated the patient, evaluated laboratory and imaging results, formulated the assessment and plan and placed orders. The Patient requires high complexity  decision making with multiple systems involvement.  Rounds were done with the Respiratory Therapy Director and Staff therapists and discussed with nursing staff also.  Yevonne Pax, MD [facility] Pulmonary Critical Care Medicine Sleep Medicine

## 2021-01-26 ENCOUNTER — Other Ambulatory Visit (HOSPITAL_COMMUNITY): Payer: Federal, State, Local not specified - PPO

## 2021-01-26 DIAGNOSIS — I87099 Postthrombotic syndrome with other complications of unspecified lower extremity: Secondary | ICD-10-CM | POA: Diagnosis not present

## 2021-01-26 DIAGNOSIS — U071 COVID-19: Secondary | ICD-10-CM | POA: Diagnosis not present

## 2021-01-26 DIAGNOSIS — J9621 Acute and chronic respiratory failure with hypoxia: Secondary | ICD-10-CM | POA: Diagnosis not present

## 2021-01-26 DIAGNOSIS — J159 Unspecified bacterial pneumonia: Secondary | ICD-10-CM | POA: Diagnosis not present

## 2021-01-26 LAB — CBC
HCT: 25 % — ABNORMAL LOW (ref 39.0–52.0)
Hemoglobin: 8 g/dL — ABNORMAL LOW (ref 13.0–17.0)
MCH: 30.7 pg (ref 26.0–34.0)
MCHC: 32 g/dL (ref 30.0–36.0)
MCV: 95.8 fL (ref 80.0–100.0)
Platelets: 390 10*3/uL (ref 150–400)
RBC: 2.61 MIL/uL — ABNORMAL LOW (ref 4.22–5.81)
RDW: 17.6 % — ABNORMAL HIGH (ref 11.5–15.5)
WBC: 13.1 10*3/uL — ABNORMAL HIGH (ref 4.0–10.5)
nRBC: 0.4 % — ABNORMAL HIGH (ref 0.0–0.2)

## 2021-01-26 LAB — BASIC METABOLIC PANEL
Anion gap: 10 (ref 5–15)
BUN: 15 mg/dL (ref 8–23)
CO2: 35 mmol/L — ABNORMAL HIGH (ref 22–32)
Calcium: 8.7 mg/dL — ABNORMAL LOW (ref 8.9–10.3)
Chloride: 96 mmol/L — ABNORMAL LOW (ref 98–111)
Creatinine, Ser: 0.49 mg/dL — ABNORMAL LOW (ref 0.61–1.24)
GFR, Estimated: >60 mL/min (ref >60)
Glucose, Bld: 110 mg/dL — ABNORMAL HIGH (ref 70–99)
Potassium: 3.1 mmol/L — ABNORMAL LOW (ref 3.5–5.1)
Sodium: 141 mmol/L (ref 135–145)

## 2021-01-26 LAB — BLOOD GAS, ARTERIAL
Acid-Base Excess: 10.9 mmol/L — ABNORMAL HIGH (ref 0.0–2.0)
Bicarbonate: 35.4 mmol/L — ABNORMAL HIGH (ref 20.0–28.0)
FIO2: 35
O2 Saturation: 90.6 %
Patient temperature: 37
pCO2 arterial: 50.9 mmHg — ABNORMAL HIGH (ref 32.0–48.0)
pH, Arterial: 7.456 — ABNORMAL HIGH (ref 7.350–7.450)
pO2, Arterial: 59.2 mmHg — ABNORMAL LOW (ref 83.0–108.0)

## 2021-01-26 LAB — PHOSPHORUS: Phosphorus: 2.3 mg/dL — ABNORMAL LOW (ref 2.5–4.6)

## 2021-01-26 LAB — MAGNESIUM: Magnesium: 1.9 mg/dL (ref 1.7–2.4)

## 2021-01-26 NOTE — Progress Notes (Signed)
Pulmonary Critical Care Medicine [facility] [place]   PULMONARY CRITICAL CARE SERVICE  PROGRESS NOTE  Date of Service: 01/26/2021  Scott Roberts  JSE:[id number]  DOB: January 01, 1956   DOA: 12/27/2020  Referring Physician: [name], MD  HPI: Scott Roberts is a 65 y.o. adult seen for follow up of Acute on Chronic Respiratory Failure.  Patient was attempted at weaning yesterday however failed now is on assist control full support  Medications: Reviewed on Rounds  Physical Exam:  Vitals: Temperature is 98.4 pulse 82 respiratory 26 blood pressure is 130/74 saturations 99%  Ventilator Settings on assist control FiO2 is 35% tidal volume 500 PEEP 5  . General: Comfortable at this time . Eyes: Grossly normal lids, irises & conjunctiva . ENT: grossly tongue is normal . Neck: no obvious mass . Cardiovascular: S1 S2 normal no gallop . Respiratory: Scattered rhonchi expansion is equal . Abdomen: soft . Skin: no rash seen on limited exam . Musculoskeletal: not rigid . Psychiatric:unable to assess . Neurologic: no seizure no involuntary movements         Lab Data:   Basic Metabolic Panel: Recent Labs  Lab 01/20/21 0532 01/22/21 0450 01/24/21 0454 01/25/21 0350 01/26/21 0531  NA  --  141 141 141 141  K 3.5 3.7 3.6 3.6 3.1*  CL  --  97* 98 98 96*  CO2  --  33* 33* 34* 35*  GLUCOSE  --  105* 117* 102* 110*  BUN  --  15 15 18 15   CREATININE  --  0.54* 0.50* 0.55* 0.49*  CALCIUM  --  8.5* 8.7* 8.6* 8.7*  MG 2.0 1.8 1.7 2.0 1.9  PHOS  --   --  2.3* 2.1* 2.3*    ABG: No results for input(s): PHART, PCO2ART, PO2ART, HCO3, O2SAT in the last 168 hours.  Liver Function Tests: Recent Labs  Lab 01/25/21 0350  ALBUMIN 1.8*   No results for input(s): LIPASE, AMYLASE in the last 168 hours. No results for input(s): AMMONIA in the last 168 hours.  CBC: Recent Labs  Lab 01/22/21 0644 01/23/21 0409 01/24/21 0454 01/25/21 0350 01/26/21 0531  WBC 7.9 9.0 10.9* 9.1  13.1*  HGB 7.7* 8.2* 7.9* 7.5* 8.0*  HCT 26.0* 26.3* 26.8* 24.8* 25.0*  MCV 100.8* 99.6 99.6 97.3 95.8  PLT 295 303 305 334 390    Cardiac Enzymes: No results for input(s): CKTOTAL, CKMB, CKMBINDEX, TROPONINI in the last 168 hours.  BNP (last 3 results) No results for input(s): BNP in the last 8760 hours.  ProBNP (last 3 results) No results for input(s): PROBNP in the last 8760 hours.  Radiological Exams: DG Chest Port 1 View  Result Date: 01/26/2021 CLINICAL DATA:  Pneumonia EXAM: PORTABLE CHEST 1 VIEW COMPARISON:  Two days ago FINDINGS: Confluent bilateral airspace disease is unchanged. Tracheostomy tube in place. Stable heart size and mediastinal contours. No visible air leak. IMPRESSION: Unchanged confluent pneumonia. Electronically Signed   By: 03/26/2021 M.D.   On: 01/26/2021 06:14    Assessment/Plan Active Problems:   Acute on chronic respiratory failure with hypoxia (HCC)   COVID-19 virus infection   Acute respiratory distress syndrome (ARDS) due to COVID-19 virus [facility])   Healthcare associated bacterial pneumonia   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 Acute on chronic respiratory failure hypoxia we will continue with full support on the ventilator yesterday attempted at the weaning did not do well 2. COVID-19 virus infection recovery 3. ARDS treated  slow improvement 4. Healthcare associated pneumonia treated with antibiotics 5. DVT on anticoagulation   I have personally seen and evaluated the patient, evaluated laboratory and imaging results, formulated the assessment and plan and placed orders. The Patient requires high complexity decision making with multiple systems involvement.  Rounds were done with the Respiratory Therapy Director and Staff therapists and discussed with nursing staff also.  Yevonne Pax, MD [facility] Pulmonary Critical Care Medicine Sleep Medicine

## 2021-01-27 ENCOUNTER — Other Ambulatory Visit (HOSPITAL_COMMUNITY): Payer: Federal, State, Local not specified - PPO

## 2021-01-27 DIAGNOSIS — J9621 Acute and chronic respiratory failure with hypoxia: Secondary | ICD-10-CM | POA: Diagnosis not present

## 2021-01-27 DIAGNOSIS — J159 Unspecified bacterial pneumonia: Secondary | ICD-10-CM | POA: Diagnosis not present

## 2021-01-27 DIAGNOSIS — U071 COVID-19: Secondary | ICD-10-CM | POA: Diagnosis not present

## 2021-01-27 DIAGNOSIS — I87099 Postthrombotic syndrome with other complications of unspecified lower extremity: Secondary | ICD-10-CM | POA: Diagnosis not present

## 2021-01-27 LAB — BASIC METABOLIC PANEL
Anion gap: 11 (ref 5–15)
BUN: 14 mg/dL (ref 8–23)
CO2: 31 mmol/L (ref 22–32)
Calcium: 8.5 mg/dL — ABNORMAL LOW (ref 8.9–10.3)
Chloride: 99 mmol/L (ref 98–111)
Creatinine, Ser: 0.48 mg/dL — ABNORMAL LOW (ref 0.61–1.24)
GFR, Estimated: >60 mL/min (ref >60)
Glucose, Bld: 124 mg/dL — ABNORMAL HIGH (ref 70–99)
Potassium: 3.5 mmol/L (ref 3.5–5.1)
Sodium: 141 mmol/L (ref 135–145)

## 2021-01-27 LAB — CBC
HCT: 28 % — ABNORMAL LOW (ref 39.0–52.0)
Hemoglobin: 8.9 g/dL — ABNORMAL LOW (ref 13.0–17.0)
MCH: 30.7 pg (ref 26.0–34.0)
MCHC: 31.8 g/dL (ref 30.0–36.0)
MCV: 96.6 fL (ref 80.0–100.0)
Platelets: 414 10*3/uL — ABNORMAL HIGH (ref 150–400)
RBC: 2.9 MIL/uL — ABNORMAL LOW (ref 4.22–5.81)
RDW: 17.6 % — ABNORMAL HIGH (ref 11.5–15.5)
WBC: 13.4 10*3/uL — ABNORMAL HIGH (ref 4.0–10.5)
nRBC: 0.4 % — ABNORMAL HIGH (ref 0.0–0.2)

## 2021-01-27 LAB — BLOOD GAS, ARTERIAL
Acid-Base Excess: 10.3 mmol/L — ABNORMAL HIGH (ref 0.0–2.0)
Bicarbonate: 35.3 mmol/L — ABNORMAL HIGH (ref 20.0–28.0)
FIO2: 40
O2 Saturation: 93.7 %
Patient temperature: 36.3
pCO2 arterial: 55.5 mmHg — ABNORMAL HIGH (ref 32.0–48.0)
pH, Arterial: 7.416 (ref 7.350–7.450)
pO2, Arterial: 67.9 mmHg — ABNORMAL LOW (ref 83.0–108.0)

## 2021-01-27 LAB — PHOSPHORUS: Phosphorus: 3.7 mg/dL (ref 2.5–4.6)

## 2021-01-27 LAB — MAGNESIUM: Magnesium: 1.7 mg/dL (ref 1.7–2.4)

## 2021-01-27 NOTE — Progress Notes (Signed)
Pulmonary Critical Care Medicine [facility] [place]   PULMONARY CRITICAL CARE SERVICE  PROGRESS NOTE  Date of Service: 01/27/2021  Scott HIMEBAUGH  DTO:[id number]  DOB: [birth year]   DOA: 12/27/2020  Referring Physician: [name], MD  HPI: Scott Roberts is a 65 y.o. adult seen for follow up of Acute on Chronic Respiratory Failure.  Patient currently is on assist control has been on 40% FiO2 with a PEEP of 5  Medications: Reviewed on Rounds  Physical Exam:  Vitals: Temperature is 97.4 pulse 95 respiratory rate is 32 blood pressure is 143/86 saturations are 98%  Ventilator Settings on assist control FiO2 40% tidal volume 500 with a PEEP of 5  . General: Comfortable at this time . Eyes: Grossly normal lids, irises & conjunctiva . ENT: grossly tongue is normal . Neck: no obvious mass . Cardiovascular: S1 S2 normal no gallop . Respiratory: Coarse breath sounds with few scattered rhonchi . Abdomen: soft . Skin: no rash seen on limited exam . Musculoskeletal: not rigid . Psychiatric:unable to assess . Neurologic: no seizure no involuntary movements         Lab Data:   Basic Metabolic Panel: Recent Labs  Lab 01/22/21 0450 01/24/21 0454 01/25/21 0350 01/26/21 0531 01/27/21 0432  NA 141 141 141 141 141  K 3.7 3.6 3.6 3.1* 3.5  CL 97* 98 98 96* 99  CO2 33* 33* 34* 35* 31  GLUCOSE 105* 117* 102* 110* 124*  BUN 15 15 18 15 14   CREATININE 0.54* 0.50* 0.55* 0.49* 0.48*  CALCIUM 8.5* 8.7* 8.6* 8.7* 8.5*  MG 1.8 1.7 2.0 1.9 1.7  PHOS  --  2.3* 2.1* 2.3* 3.7    ABG: Recent Labs  Lab 01/26/21 1631 01/27/21 0456  PHART 7.456* 7.416  PCO2ART 50.9* 55.5*  PO2ART 59.2* 67.9*  HCO3 35.4* 35.3*  O2SAT 90.6 93.7    Liver Function Tests: Recent Labs  Lab 01/25/21 0350  ALBUMIN 1.8*   No results for input(s): LIPASE, AMYLASE in the last 168 hours. No results for input(s): AMMONIA in the last 168 hours.  CBC: Recent Labs  Lab 01/23/21 0409  01/24/21 0454 01/25/21 0350 01/26/21 0531 01/27/21 0432  WBC 9.0 10.9* 9.1 13.1* 13.4*  HGB 8.2* 7.9* 7.5* 8.0* 8.9*  HCT 26.3* 26.8* 24.8* 25.0* 28.0*  MCV 99.6 99.6 97.3 95.8 96.6  PLT 303 305 334 390 414*    Cardiac Enzymes: No results for input(s): CKTOTAL, CKMB, CKMBINDEX, TROPONINI in the last 168 hours.  BNP (last 3 results) No results for input(s): BNP in the last 8760 hours.  ProBNP (last 3 results) No results for input(s): PROBNP in the last 8760 hours.  Radiological Exams: DG Chest Port 1 View  Result Date: 01/27/2021 CLINICAL DATA:  Respiratory dependent. Acute on chronic respiratory failure. EXAM: PORTABLE CHEST 1 VIEW COMPARISON:  One-view chest x-ray 01/26/2021 FINDINGS: Heart is enlarged. Diffuse interstitial and airspace opacities are again seen, slightly improved. Bilateral effusions are stable. Tracheostomy tube is in place. IMPRESSION: 1. Slight improvement in diffuse interstitial and airspace opacities. Findings compatible with ARDS. 2. Stable bilateral pleural effusions. 3. Stable cardiomegaly. Electronically Signed   By: 03/26/2021 M.D.   On: 01/27/2021 07:28   DG CHEST PORT 1 VIEW  Result Date: 01/26/2021 CLINICAL DATA:  ARDS/airspace opacity. EXAM: PORTABLE CHEST 1 VIEW COMPARISON:  01/26/2021 at 5:18 a.m. FINDINGS: Tracheostomy tube noted projecting over the tracheal air shadow. Worsening bilateral airspace opacities diffusely. Obscured hemidiaphragms. Possible underlying cardiomegaly. Probable bilateral  pleural effusions. Old healed right clavicular fracture. IMPRESSION: 1. Worsening bilateral airspace opacities which may reflect pulmonary edema, diffuse pneumonia, or ARDS. 2. Probable bilateral pleural effusions. 3. Tracheostomy tube projects over the tracheal air shadow. Electronically Signed   By: Gaylyn Rong M.D.   On: 01/26/2021 16:58   DG Chest Port 1 View  Result Date: 01/26/2021 CLINICAL DATA:  Pneumonia EXAM: PORTABLE CHEST 1 VIEW  COMPARISON:  Two days ago FINDINGS: Confluent bilateral airspace disease is unchanged. Tracheostomy tube in place. Stable heart size and mediastinal contours. No visible air leak. IMPRESSION: Unchanged confluent pneumonia. Electronically Signed   By: Marnee Spring M.D.   On: 01/26/2021 06:14    Assessment/Plan Active Problems:   Acute on chronic respiratory failure with hypoxia (HCC)   COVID-19 virus infection   Acute respiratory distress syndrome (ARDS) due to COVID-19 virus [facility])   Healthcare associated bacterial pneumonia   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 Acute on chronic respiratory failure hypoxia we will continue with full support on assist control on 40% FiO2. 2. COVID-19 virus infection in recovery we will continue to follow 3. ARDS treated slow improvement 4. Healthcare associated pneumonia slowly improving 5. 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 The Patient requires high complexity decision making with multiple systems involvement.  Rounds were done with the Respiratory Therapy Director and Staff therapists and discussed with nursing staff also.  Yevonne Pax, MD [facility] Pulmonary Critical Care Medicine Sleep Medicine

## 2021-01-28 ENCOUNTER — Other Ambulatory Visit (HOSPITAL_COMMUNITY): Payer: Federal, State, Local not specified - PPO

## 2021-01-28 LAB — RENAL FUNCTION PANEL
Albumin: 1.9 g/dL — ABNORMAL LOW (ref 3.5–5.0)
Anion gap: 12 (ref 5–15)
BUN: 13 mg/dL (ref 8–23)
CO2: 29 mmol/L (ref 22–32)
Calcium: 8.6 mg/dL — ABNORMAL LOW (ref 8.9–10.3)
Chloride: 99 mmol/L (ref 98–111)
Creatinine, Ser: 0.54 mg/dL — ABNORMAL LOW (ref 0.61–1.24)
GFR, Estimated: >60 mL/min (ref >60)
Glucose, Bld: 105 mg/dL — ABNORMAL HIGH (ref 70–99)
Phosphorus: 3.5 mg/dL (ref 2.5–4.6)
Potassium: 5 mmol/L (ref 3.5–5.1)
Sodium: 140 mmol/L (ref 135–145)

## 2021-01-28 LAB — MAGNESIUM: Magnesium: 2 mg/dL (ref 1.7–2.4)

## 2021-01-28 LAB — CBC
HCT: 26.7 % — ABNORMAL LOW (ref 39.0–52.0)
Hemoglobin: 8.3 g/dL — ABNORMAL LOW (ref 13.0–17.0)
MCH: 30.9 pg (ref 26.0–34.0)
MCHC: 31.1 g/dL (ref 30.0–36.0)
MCV: 99.3 fL (ref 80.0–100.0)
Platelets: 389 10*3/uL (ref 150–400)
RBC: 2.69 MIL/uL — ABNORMAL LOW (ref 4.22–5.81)
RDW: 18 % — ABNORMAL HIGH (ref 11.5–15.5)
WBC: 12.3 10*3/uL — ABNORMAL HIGH (ref 4.0–10.5)
nRBC: 2.4 % — ABNORMAL HIGH (ref 0.0–0.2)

## 2021-01-28 NOTE — Progress Notes (Signed)
PROGRESS NOTE    Scott Roberts  [id number] DOB: 1956-06-04 DOA: 12/27/2020  Brief Narrative:  Scott Roberts is an 65 y.o. male who presented to the emergency department with worsening shortness of breath and respiratory distress along with hypoxemia.  He was diagnosed with acute respiratory failure secondary to COVID-19 infection with pneumonia. He presented to [facility] on 11/21/2020. He did not qualify for remdesivir secondary to high oxygen requirements.  He was treated with steroids and was also given interleukin-6 inhibitor on 11/21/2020.  Unfortunately he had progressively worsening respiratory failure and had to be intubated on 11/21/2020.  He was extubated on 12/01/2020.  However, his respiratory status worsened and he had to be reintubated on 12/02/2020.  He was unable to be weaned from the vent therefore received tracheostomy on 12/06/2020.  His hospital course complicated by bilateral lower extremity DVTs, small pulmonary embolism thought to be associated with COVID-19 infection.  His hospital course also complicated by acute kidney injury requiring hemodialysis.  Eventually he had some renal recovery and dialysis was discontinued.  He also had healthcare associated pneumonia.  His sputum cultures from 12/12/2020 showed Pseudomonas aeruginosa.  He was initially treated with IV vancomycin, meropenem and later changed to IV cefepime.  He had thrombocytopenia, exact etiology was unclear but there was concern for possible beta-lactam induced thrombocytopenia therefore switched to ciprofloxacin.  He completed treatment with this.  However, he continued to have copious secretions.  Respiratory culture from 12/26/2020 showed moderate growth gram-negative rods and aztreonam was started.  He also had anemia thought to be secondary to inflammation and he received 1 unit PRBC on 12/14/2020 and 1 unit PRBC on 12/24/2020.  Patient also had encephalopathy thought to be secondary to  COVID-19 infection.  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 His respiratory cultures from here showed moderate WBC on the gram stain and cultures Pseudomonas aeruginosa for which he was treated with meropenem.  After completing antibiotics he started having fever again, increasing secretions.  He is currently on 40% FiO2, 5 of PEEP.   Assessment & Plan:  Active Problems:   Acute on chronic hypoxemic respiratory failure, ventilator dependent Pneumonia with Pseudomonas COVID-19 infection Leukocytosis Encephalopathy Bilateral lower extremity DVT Atrial fibrillation with RVR Acute kidney injury Protein-calorie malnutrition Dysphagia  Acute hypoxemic respiratory failure: Patient remains ventilator dependent.  He is on 40% FiO2, PEEP of 5.  He initially had COVID-19 infection with pneumonia.  Subsequent bacterial pneumonia.  His respiratory cultures at the outside facility showed Pseudomonas aeruginosa.  He has received treatment with multiple antibiotics including IV vancomycin, meropenem, cefepime, ciprofloxacin at the acute facility.  After that he was subsequently switched to Azactam. However, had worsening leukocytosis worsening respiratory status. Chest x-ray per report diffuse dense bilateral pulmonary infiltrates. He also had mucous plugging and required suctioning. He got almost 7-10 days of Azactam but not much improvement. Therefore switched to meropenem. After completing meropenem continue to have increased secretions. Respiratory cultures from 01/19/2021 showed Pseudomonas.  Since he already received treatment with meropenem now switched to ceftazidime. We will plan to treat for duration of 1 week pending improvement. Pulmonary following.  Due to his chronic trach he is high risk for recurrent tracheobronchitis.  He also has dysphagia and high risk for recurrent aspiration pneumonia despite being on antibiotics.  Pneumonia: He had secondary bacterial  pneumonia with Pseudomonas at the acute facility for which he was already treated.  After that he was  treated with IV aztreonam and then with IV vancomycin.  Now being treated with ceftazidime. He remains on the ventilator at this time.    If his respiratory status worsens, suggest chest CT to better evaluate.  He also has dysphagia and at risk for aspiration and recurrent aspiration pneumonia despite being on antibiotics.  -He had thrombocytopenia at the acute facility the exact etiology for which was unclear for which reason beta-lactam's were discontinued although low suspicion. Please monitor counts closely while on antibiotics.  COVID-19 infection: Patient was treated for this at the outside facility.  He received IL-6 inhibitor.  He is high risk for ARDS from the COVID-19 infection.  He is status post Decadron.  He received treatment with hydroxyurea, folic acid, fish oil. He is at risk for ARDS from the COVID-19 infection.  Continue supportive management per the primary team.  Leukocytosis: Likely secondary to the pneumonia.  Antibiotics and plan as mentioned above. Monitor counts closely.  Due to his dysphagia, chronic trach he is high risk for recurrent tracheobronchitis and recurrent aspiration pneumonia and worsening leukocytosis despite being on antibiotics.  Encephalopathy: Continue medications and supportive management by the primary team.  Bilateral lower extremity DVT: Further management per the primary team.  Patient also had anemia and required blood transfusion at the outside facility.  Continue to monitor closely.  Atrial fibrillation with RVR: Cardiology consulted.  Echocardiogram showed Left ventricular ejection fraction 50 to 55%. The left ventricle demonstrates global hypokinesis.  Continue medications and management per the primary team and cardiology.  Acute kidney injury: Patient was on dialysis at the acute facility.  At this time renal function appears to be stable.   Please monitor BUN/creatinine closely while on the antibiotics.  Avoid nephrotoxic medications.  Further management per the primary team.  Protein-calorie malnutrition/dysphagia: Management per primary team.  Due to his dysphagia he is high risk for aspiration and recurrent aspiration pneumonia despite being on antibiotics.  Due to his complex medical problems he is high risk for worsening and decompensation.  Plan of care discussed with the primary team and pharmacy.   Subjective: He is on 40% FiO2, 5 of PEEP.  Continues to have secretions.  Objective: Vitals: Temperature 99.4, pulse 95, respiratory rate 36, blood pressure 147/86, pulse oximetry 97%  Examination: Constitutional: Ill-appearing male, on vent Head: Atraumatic, normocephalic Eyes: PERLA ENMT: Moist oral mucosa, no ear or nose lesions. Neck: Trach in place CVS: S1-S2 Respiratory: Coarse breath sounds, rhonchi Abdomen: soft, positive bowel sounds  Musculoskeletal: Upper extremity edema, lower extremity edema Neuro:  Severe debility with generalized weakness. Psych: stable Skin: no rashes    Data Reviewed: I have personally reviewed following labs and imaging studies  CBC: Recent Labs  Lab 01/24/21 0454 01/25/21 0350 01/26/21 0531 01/27/21 0432 01/28/21 0757  WBC 10.9* 9.1 13.1* 13.4* 12.3*  HGB 7.9* 7.5* 8.0* 8.9* 8.3*  HCT 26.8* 24.8* 25.0* 28.0* 26.7*  MCV 99.6 97.3 95.8 96.6 99.3  PLT 305 334 390 414* 389    Basic Metabolic Panel: Recent Labs  Lab 01/24/21 0454 01/25/21 0350 01/26/21 0531 01/27/21 0432 01/28/21 0757  NA 141 141 141 141 140  K 3.6 3.6 3.1* 3.5 5.0  CL 98 98 96* 99 99  CO2 33* 34* 35* 31 29  GLUCOSE 117* 102* 110* 124* 105*  BUN 15 18 15 14 13   CREATININE 0.50* 0.55* 0.49* 0.48* 0.54*  CALCIUM 8.7* 8.6* 8.7* 8.5* 8.6*  MG 1.7 2.0 1.9 1.7 2.0  PHOS 2.3* 2.1*  2.3* 3.7 3.5    GFR: CrCl cannot be calculated (Unknown ideal weight.).  Liver Function Tests: Recent Labs   Lab 01/25/21 0350 01/28/21 0757  ALBUMIN 1.8* 1.9*    CBG: No results for input(s): GLUCAP in the last 168 hours.   Recent Results (from the past 240 hour(s))  Culture, respiratory (non-expectorated)     Status: None   Collection Time: 01/19/21  1:09 PM   Specimen: Tracheal Aspirate; Respiratory  Result Value Ref Range Status   Specimen Description TRACHEAL ASPIRATE  Final   Special Requests NONE  Final   Gram Stain   Final    ABUNDANT WBC PRESENT,BOTH PMN AND MONONUCLEAR NO ORGANISMS SEEN Performed at [facility], [address] [place], [place] [address]    Culture MODERATE PSEUDOMONAS AERUGINOSA  Final   Report Status 01/21/2021 FINAL  Final   Organism ID, Bacteria PSEUDOMONAS AERUGINOSA  Final      Susceptibility   Pseudomonas aeruginosa - MIC*    CEFTAZIDIME 8 SENSITIVE Sensitive     CIPROFLOXACIN >=4 RESISTANT Resistant     GENTAMICIN <=1 SENSITIVE Sensitive     IMIPENEM 2 SENSITIVE Sensitive     PIP/TAZO 16 SENSITIVE Sensitive     * MODERATE PSEUDOMONAS AERUGINOSA     Radiology Studies: DG Chest Port 1 View  Result Date: 01/28/2021 CLINICAL DATA:  Ventilator dependent.  COVID 19 pneumonia. EXAM: PORTABLE CHEST 1 VIEW COMPARISON:  One-view chest x-ray 01/27/2021 FINDINGS: Tracheostomy tube is stable. Heart is enlarged. Interstitial and airspace opacities remain bilaterally, not significantly changed. Bilateral pleural effusions are present, right greater than left. IMPRESSION: 1. Stable appearance of diffuse interstitial and airspace opacities bilaterally. 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 2. Stable bilateral pleural effusions, right greater than left. Electronically Signed   By: Marin Roberts M.D.   On: 01/28/2021 06:57   DG Chest Port 1 View  Result Date: 01/27/2021 CLINICAL DATA:  Respiratory dependent. Acute on chronic respiratory failure. EXAM: PORTABLE CHEST 1 VIEW COMPARISON:  One-view chest x-ray 01/26/2021 FINDINGS:  Heart is enlarged. Diffuse interstitial and airspace opacities are again seen, slightly improved. Bilateral effusions are stable. Tracheostomy tube is in place. IMPRESSION: 1. Slight improvement in diffuse interstitial and airspace opacities. Findings compatible with ARDS. 2. Stable bilateral pleural effusions. 3. Stable cardiomegaly. Electronically Signed   By: Marin Roberts M.D.   On: 01/27/2021 07:28    Scheduled Meds: Please see MAR  Vonzella Nipple, MD  01/28/2021, 5:23 PM

## 2021-01-29 DIAGNOSIS — J159 Unspecified bacterial pneumonia: Secondary | ICD-10-CM | POA: Diagnosis not present

## 2021-01-29 DIAGNOSIS — I87099 Postthrombotic syndrome with other complications of unspecified lower extremity: Secondary | ICD-10-CM | POA: Diagnosis not present

## 2021-01-29 DIAGNOSIS — U071 COVID-19: Secondary | ICD-10-CM | POA: Diagnosis not present

## 2021-01-29 DIAGNOSIS — J9621 Acute and chronic respiratory failure with hypoxia: Secondary | ICD-10-CM | POA: Diagnosis not present

## 2021-01-29 LAB — RENAL FUNCTION PANEL
Albumin: 1.9 g/dL — ABNORMAL LOW (ref 3.5–5.0)
Anion gap: 10 (ref 5–15)
BUN: 13 mg/dL (ref 8–23)
CO2: 33 mmol/L — ABNORMAL HIGH (ref 22–32)
Calcium: 8.4 mg/dL — ABNORMAL LOW (ref 8.9–10.3)
Chloride: 98 mmol/L (ref 98–111)
Creatinine, Ser: 0.48 mg/dL — ABNORMAL LOW (ref 0.61–1.24)
GFR, Estimated: >60 mL/min (ref >60)
Glucose, Bld: 111 mg/dL — ABNORMAL HIGH (ref 70–99)
Phosphorus: 3.2 mg/dL (ref 2.5–4.6)
Potassium: 3.2 mmol/L — ABNORMAL LOW (ref 3.5–5.1)
Sodium: 141 mmol/L (ref 135–145)

## 2021-01-29 LAB — CBC
HCT: 25.8 % — ABNORMAL LOW (ref 39.0–52.0)
Hemoglobin: 8.1 g/dL — ABNORMAL LOW (ref 13.0–17.0)
MCH: 30.3 pg (ref 26.0–34.0)
MCHC: 31.4 g/dL (ref 30.0–36.0)
MCV: 96.6 fL (ref 80.0–100.0)
Platelets: 402 10*3/uL — ABNORMAL HIGH (ref 150–400)
RBC: 2.67 MIL/uL — ABNORMAL LOW (ref 4.22–5.81)
RDW: 17.9 % — ABNORMAL HIGH (ref 11.5–15.5)
WBC: 10.7 10*3/uL — ABNORMAL HIGH (ref 4.0–10.5)
nRBC: 0.3 % — ABNORMAL HIGH (ref 0.0–0.2)

## 2021-01-29 LAB — MAGNESIUM: Magnesium: 1.8 mg/dL (ref 1.7–2.4)

## 2021-01-29 NOTE — Progress Notes (Signed)
Pulmonary Critical Care Medicine [facility] [place]   PULMONARY CRITICAL CARE SERVICE  PROGRESS NOTE  Date of Service: 01/29/2021  Scott Roberts  JGG:[id number]  DOB: 11-May-1956   DOA: 12/27/2020  Referring Physician: [name], MD  HPI: Scott Roberts is a 65 y.o. adult seen for follow up of Acute on Chronic Respiratory Failure.  Patient is comfortable right now without distress at this time was attempted on pressure support did not tolerate so was placed back on the ventilator  Medications: Reviewed on Rounds  Physical Exam:  Vitals: Temperature is 97.2 pulse 78 respiratory 25 blood pressure is 143/79 saturations 98%  Ventilator Settings on assist control FiO2 is 45% tidal volume 500 with a PEEP of 5  . General: Comfortable at this time . Eyes: Grossly normal lids, irises & conjunctiva . ENT: grossly tongue is normal . Neck: no obvious mass . Cardiovascular: S1 S2 normal no gallop . Respiratory: Scattered rhonchi coarse breath sounds . Abdomen: soft . Skin: no rash seen on limited exam . Musculoskeletal: not rigid . Psychiatric:unable to assess . Neurologic: no seizure no involuntary movements         Lab Data:   Basic Metabolic Panel: Recent Labs  Lab 01/25/21 0350 01/26/21 0531 01/27/21 0432 01/28/21 0757 01/29/21 0351  NA 141 141 141 140 141  K 3.6 3.1* 3.5 5.0 3.2*  CL 98 96* 99 99 98  CO2 34* 35* 31 29 33*  GLUCOSE 102* 110* 124* 105* 111*  BUN 18 15 14 13 13   CREATININE 0.55* 0.49* 0.48* 0.54* 0.48*  CALCIUM 8.6* 8.7* 8.5* 8.6* 8.4*  MG 2.0 1.9 1.7 2.0 1.8  PHOS 2.1* 2.3* 3.7 3.5 3.2    ABG: Recent Labs  Lab 01/26/21 1631 01/27/21 0456  PHART 7.456* 7.416  PCO2ART 50.9* 55.5*  PO2ART 59.2* 67.9*  HCO3 35.4* 35.3*  O2SAT 90.6 93.7    Liver Function Tests: Recent Labs  Lab 01/25/21 0350 01/28/21 0757 01/29/21 0351  ALBUMIN 1.8* 1.9* 1.9*   No results for input(s): LIPASE, AMYLASE in the last 168 hours. No results for  input(s): AMMONIA in the last 168 hours.  CBC: Recent Labs  Lab 01/25/21 0350 01/26/21 0531 01/27/21 0432 01/28/21 0757 01/29/21 0351  WBC 9.1 13.1* 13.4* 12.3* 10.7*  HGB 7.5* 8.0* 8.9* 8.3* 8.1*  HCT 24.8* 25.0* 28.0* 26.7* 25.8*  MCV 97.3 95.8 96.6 99.3 96.6  PLT 334 390 414* 389 402*    Cardiac Enzymes: No results for input(s): CKTOTAL, CKMB, CKMBINDEX, TROPONINI in the last 168 hours.  BNP (last 3 results) No results for input(s): BNP in the last 8760 hours.  ProBNP (last 3 results) No results for input(s): PROBNP in the last 8760 hours.  Radiological Exams: DG Chest Port 1 View  Result Date: 01/28/2021 CLINICAL DATA:  Ventilator dependent.  COVID 19 pneumonia. EXAM: PORTABLE CHEST 1 VIEW COMPARISON:  One-view chest x-ray 01/27/2021 FINDINGS: Tracheostomy tube is stable. Heart is enlarged. Interstitial and airspace opacities remain bilaterally, not significantly changed. Bilateral pleural effusions are present, right greater than left. IMPRESSION: 1. Stable appearance of diffuse interstitial and airspace opacities bilaterally. 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 2. Stable bilateral pleural effusions, right greater than left. Electronically Signed   By: 03/27/2021 M.D.   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 Acute on chronic respiratory failure hypoxia we will continue with the full support on the ventilator.  Patient has failed attempts at weaning 2. COVID-19 virus infection recovery we will continue with supportive care. 3. ARDS very slow to improve 4. Healthcare associated pneumonia treated 5. DVT treated we will continue to follow along   I  have personally seen and evaluated the patient, evaluated laboratory and imaging results, formulated the assessment and plan and placed orders. The Patient requires high complexity decision making with multiple systems involvement.  Rounds were done with the Respiratory Therapy Director and Staff therapists and discussed with nursing staff also.  Yevonne Pax, MD [facility] Pulmonary Critical Care Medicine Sleep Medicine

## 2021-01-30 DIAGNOSIS — U071 COVID-19: Secondary | ICD-10-CM | POA: Diagnosis not present

## 2021-01-30 DIAGNOSIS — J159 Unspecified bacterial pneumonia: Secondary | ICD-10-CM | POA: Diagnosis not present

## 2021-01-30 DIAGNOSIS — I87099 Postthrombotic syndrome with other complications of unspecified lower extremity: Secondary | ICD-10-CM | POA: Diagnosis not present

## 2021-01-30 DIAGNOSIS — J9621 Acute and chronic respiratory failure with hypoxia: Secondary | ICD-10-CM | POA: Diagnosis not present

## 2021-01-30 LAB — POTASSIUM: Potassium: 3.8 mmol/L (ref 3.5–5.1)

## 2021-01-30 NOTE — Progress Notes (Signed)
Pulmonary Critical Care Medicine [facility] [place]   PULMONARY CRITICAL CARE SERVICE  PROGRESS NOTE  Date of Service: 01/30/2021  Scott Roberts  [id number]  DOB: 17-Jul-1956   DOA: 12/27/2020  Referring Physician: [name], MD  HPI: Scott Roberts is a 65 y.o. adult seen for follow up of Acute on Chronic Respiratory Failure.  Patient currently is on full support 8 5 hours of pressure support yesterday so today will be trying for 12 hours  Medications: Reviewed on Rounds  Physical Exam:  Vitals: Temperature is 98.4 pulse 79 respiratory 25 blood pressure is 147/85 saturations 98%  Ventilator Settings currently on assist control FiO2 40% tidal volume of 500 with a PEEP of 7  . General: Comfortable at this time . Eyes: Grossly normal lids, irises & conjunctiva . ENT: grossly tongue is normal . Neck: no obvious mass . Cardiovascular: S1 S2 normal no gallop . Respiratory: Scattered rhonchi expansion is equal . Abdomen: soft . Skin: no rash seen on limited exam . Musculoskeletal: not rigid . Psychiatric:unable to assess . Neurologic: no seizure no involuntary movements         Lab Data:   Basic Metabolic Panel: Recent Labs  Lab 01/25/21 0350 01/26/21 0531 01/27/21 0432 01/28/21 0757 01/29/21 0351 01/30/21 0408  NA 141 141 141 140 141  --   K 3.6 3.1* 3.5 5.0 3.2* 3.8  CL 98 96* 99 99 98  --   CO2 34* 35* 31 29 33*  --   GLUCOSE 102* 110* 124* 105* 111*  --   BUN 18 15 14 13 13   --   CREATININE 0.55* 0.49* 0.48* 0.54* 0.48*  --   CALCIUM 8.6* 8.7* 8.5* 8.6* 8.4*  --   MG 2.0 1.9 1.7 2.0 1.8  --   PHOS 2.1* 2.3* 3.7 3.5 3.2  --     ABG: Recent Labs  Lab 01/26/21 1631 01/27/21 0456  PHART 7.456* 7.416  PCO2ART 50.9* 55.5*  PO2ART 59.2* 67.9*  HCO3 35.4* 35.3*  O2SAT 90.6 93.7    Liver Function Tests: Recent Labs  Lab 01/25/21 0350 01/28/21 0757 01/29/21 0351  ALBUMIN 1.8* 1.9* 1.9*   No results for input(s): LIPASE, AMYLASE in the  last 168 hours. No results for input(s): AMMONIA in the last 168 hours.  CBC: Recent Labs  Lab 01/25/21 0350 01/26/21 0531 01/27/21 0432 01/28/21 0757 01/29/21 0351  WBC 9.1 13.1* 13.4* 12.3* 10.7*  HGB 7.5* 8.0* 8.9* 8.3* 8.1*  HCT 24.8* 25.0* 28.0* 26.7* 25.8*  MCV 97.3 95.8 96.6 99.3 96.6  PLT 334 390 414* 389 402*    Cardiac Enzymes: No results for input(s): CKTOTAL, CKMB, CKMBINDEX, TROPONINI in the last 168 hours.  BNP (last 3 results) No results for input(s): BNP in the last 8760 hours.  ProBNP (last 3 results) No results for input(s): PROBNP in the last 8760 hours.  Radiological Exams: No results found.  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 Acute on chronic respiratory failure with hypoxia we will continue with the weaning process try pressure support for 12 hours today 2. COVID-19 virus infection in recovery 3. ARDS treated slow improvement 4. Healthcare associated pneumonia given slow improvement 5. Chronic DVT treated we will continue to follow along  I have personally seen and evaluated the patient, evaluated laboratory and imaging results, formulated the assessment and plan and placed orders. The Patient requires high complexity decision making with multiple systems involvement.  Rounds were done with the Respiratory Therapy Director and Staff therapists and discussed with nursing staff also.  Allyne Gee, MD [facility] Pulmonary Critical Care Medicine Sleep Medicine

## 2021-01-31 DIAGNOSIS — J159 Unspecified bacterial pneumonia: Secondary | ICD-10-CM | POA: Diagnosis not present

## 2021-01-31 DIAGNOSIS — J9621 Acute and chronic respiratory failure with hypoxia: Secondary | ICD-10-CM | POA: Diagnosis not present

## 2021-01-31 DIAGNOSIS — U071 COVID-19: Secondary | ICD-10-CM | POA: Diagnosis not present

## 2021-01-31 DIAGNOSIS — I87099 Postthrombotic syndrome with other complications of unspecified lower extremity: Secondary | ICD-10-CM | POA: Diagnosis not present

## 2021-01-31 NOTE — Progress Notes (Signed)
Pulmonary Critical Care Medicine [facility] [place]   PULMONARY CRITICAL CARE SERVICE  PROGRESS NOTE  Date of Service: 01/31/2021  Scott Roberts  AVW:[id number]  DOB: [birth year]   DOA: 12/27/2020  Referring Physician: [name], MD  HPI: Scott Roberts is a 65 y.o. adult seen for follow up of Acute on Chronic Respiratory Failure.  Patient currently is on full support was attempted at weaning but did not tolerate it so is back on assist control  Medications: Reviewed on Rounds  Physical Exam:  Vitals: Temperature is 97.1 pulse 93 respiratory 27 blood pressure is 156/84 saturations 96%  Ventilator Settings on assist control FiO2 40% tidal volume 500 with a PEEP of 7  . General: Comfortable at this time . Eyes: Grossly normal lids, irises & conjunctiva . ENT: grossly tongue is normal . Neck: no obvious mass . Cardiovascular: S1 S2 normal no gallop . Respiratory: Scattered rhonchi expansion is equal . Abdomen: soft . Skin: no rash seen on limited exam . Musculoskeletal: not rigid . Psychiatric:unable to assess . Neurologic: no seizure no involuntary movements         Lab Data:   Basic Metabolic Panel: Recent Labs  Lab 01/25/21 0350 01/26/21 0531 01/27/21 0432 01/28/21 0757 01/29/21 0351 01/30/21 0408  NA 141 141 141 140 141  --   K 3.6 3.1* 3.5 5.0 3.2* 3.8  CL 98 96* 99 99 98  --   CO2 34* 35* 31 29 33*  --   GLUCOSE 102* 110* 124* 105* 111*  --   BUN 18 15 14 13 13   --   CREATININE 0.55* 0.49* 0.48* 0.54* 0.48*  --   CALCIUM 8.6* 8.7* 8.5* 8.6* 8.4*  --   MG 2.0 1.9 1.7 2.0 1.8  --   PHOS 2.1* 2.3* 3.7 3.5 3.2  --     ABG: Recent Labs  Lab 01/26/21 1631 01/27/21 0456  PHART 7.456* 7.416  PCO2ART 50.9* 55.5*  PO2ART 59.2* 67.9*  HCO3 35.4* 35.3*  O2SAT 90.6 93.7    Liver Function Tests: Recent Labs  Lab 01/25/21 0350 01/28/21 0757 01/29/21 0351  ALBUMIN 1.8* 1.9* 1.9*   No results for input(s): LIPASE, AMYLASE in the last 168  hours. No results for input(s): AMMONIA in the last 168 hours.  CBC: Recent Labs  Lab 01/25/21 0350 01/26/21 0531 01/27/21 0432 01/28/21 0757 01/29/21 0351  WBC 9.1 13.1* 13.4* 12.3* 10.7*  HGB 7.5* 8.0* 8.9* 8.3* 8.1*  HCT 24.8* 25.0* 28.0* 26.7* 25.8*  MCV 97.3 95.8 96.6 99.3 96.6  PLT 334 390 414* 389 402*    Cardiac Enzymes: No results for input(s): CKTOTAL, CKMB, CKMBINDEX, TROPONINI in the last 168 hours.  BNP (last 3 results) No results for input(s): BNP in the last 8760 hours.  ProBNP (last 3 results) No results for input(s): PROBNP in the last 8760 hours.  Radiological Exams: No results found.  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 Acute on chronic respiratory failure with hypoxia we will continue with assist control mode patient is on 40% FiO2 continue secretion management supportive care. 2. COVID-19 virus infection in recovery we will continue to follow along. 3. ARDS treated slow improvement 4. Healthcare associated pneumonia treated with antibiotics 5. Chronic DVT  anticoagulation   I have personally seen and evaluated the patient, evaluated laboratory and imaging results, formulated the assessment and plan and placed orders. The Patient requires high complexity decision making with multiple systems involvement.  Rounds were done with the Respiratory Therapy Director and Staff therapists and discussed with nursing staff also.  Yevonne Pax, MD [facility] Pulmonary Critical Care Medicine Sleep Medicine

## 2021-02-01 ENCOUNTER — Other Ambulatory Visit (HOSPITAL_COMMUNITY): Payer: Federal, State, Local not specified - PPO

## 2021-02-01 DIAGNOSIS — U071 COVID-19: Secondary | ICD-10-CM | POA: Diagnosis not present

## 2021-02-01 DIAGNOSIS — J9621 Acute and chronic respiratory failure with hypoxia: Secondary | ICD-10-CM | POA: Diagnosis not present

## 2021-02-01 DIAGNOSIS — I87099 Postthrombotic syndrome with other complications of unspecified lower extremity: Secondary | ICD-10-CM | POA: Diagnosis not present

## 2021-02-01 DIAGNOSIS — J159 Unspecified bacterial pneumonia: Secondary | ICD-10-CM | POA: Diagnosis not present

## 2021-02-01 LAB — RENAL FUNCTION PANEL
Albumin: 2 g/dL — ABNORMAL LOW (ref 3.5–5.0)
Anion gap: 13 (ref 5–15)
BUN: 9 mg/dL (ref 8–23)
CO2: 32 mmol/L (ref 22–32)
Calcium: 8.3 mg/dL — ABNORMAL LOW (ref 8.9–10.3)
Chloride: 97 mmol/L — ABNORMAL LOW (ref 98–111)
Creatinine, Ser: 0.47 mg/dL — ABNORMAL LOW (ref 0.61–1.24)
GFR, Estimated: >60 mL/min (ref >60)
Glucose, Bld: 146 mg/dL — ABNORMAL HIGH (ref 70–99)
Phosphorus: 4.3 mg/dL (ref 2.5–4.6)
Potassium: 3.4 mmol/L — ABNORMAL LOW (ref 3.5–5.1)
Sodium: 142 mmol/L (ref 135–145)

## 2021-02-01 LAB — CBC
HCT: 27.2 % — ABNORMAL LOW (ref 39.0–52.0)
Hemoglobin: 8.7 g/dL — ABNORMAL LOW (ref 13.0–17.0)
MCH: 30.9 pg (ref 26.0–34.0)
MCHC: 32 g/dL (ref 30.0–36.0)
MCV: 96.5 fL (ref 80.0–100.0)
Platelets: 424 10*3/uL — ABNORMAL HIGH (ref 150–400)
RBC: 2.82 MIL/uL — ABNORMAL LOW (ref 4.22–5.81)
RDW: 17.9 % — ABNORMAL HIGH (ref 11.5–15.5)
WBC: 19.4 10*3/uL — ABNORMAL HIGH (ref 4.0–10.5)
nRBC: 0.1 % (ref 0.0–0.2)

## 2021-02-01 LAB — MAGNESIUM: Magnesium: 1.8 mg/dL (ref 1.7–2.4)

## 2021-02-01 NOTE — Progress Notes (Signed)
PROGRESS NOTE    Scott Roberts  MBE:[id number] DOB: April 12, 1956 DOA: 12/27/2020  Brief Narrative:  Scott Roberts is an 65 y.o. male who presented to the emergency department with worsening shortness of breath and respiratory distress along with hypoxemia.  He was diagnosed with acute respiratory failure secondary to COVID-19 infection with pneumonia. He presented to [facility] on 11/21/2020. He did not qualify for remdesivir secondary to high oxygen requirements.  He was treated with steroids and was also given interleukin-6 inhibitor on 11/21/2020.  Unfortunately he had progressively worsening respiratory failure and had to be intubated on 11/21/2020.  He was extubated on 12/01/2020.  However, his respiratory status worsened and he had to be reintubated on 12/02/2020.  He was unable to be weaned from the vent therefore received tracheostomy on 12/06/2020.  His hospital course complicated by bilateral lower extremity DVTs, small pulmonary embolism thought to be associated with COVID-19 infection.  His hospital course also complicated by acute kidney injury requiring hemodialysis.  Eventually he had some renal recovery and dialysis was discontinued.  He also had healthcare associated pneumonia.  His sputum cultures from 12/12/2020 showed Pseudomonas aeruginosa.  He was initially treated with IV vancomycin, meropenem and later changed to IV cefepime.  He had thrombocytopenia, exact etiology was unclear but there was concern for possible beta-lactam induced thrombocytopenia therefore switched to ciprofloxacin.  He completed treatment with this.  However, he continued to have copious secretions.  Respiratory culture from 12/26/2020 showed moderate growth gram-negative rods and aztreonam was started.  He also had anemia thought to be secondary to inflammation and he received 1 unit PRBC on 12/14/2020 and 1 unit PRBC on 12/24/2020.  Patient also had encephalopathy thought to be secondary to  COVID-19 infection.  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 His respiratory cultures from here showed Pseudomonas aeruginosa for which he was treated with meropenem.  After completing antibiotics he started having fever again, increasing secretions.  Therefore treated with ceftazidime.  However, he is continuing to have increased secretions with worsening leukocytosis. He is currently on 45% FiO2, 7 of PEEP.     Assessment & Plan:  Active Problems:   Acute on chronic hypoxemic respiratory failure, ventilator dependent Pneumonia with Pseudomonas Leukocytosis COVID-19 infection Encephalopathy Bilateral lower extremity DVT Atrial fibrillation Acute kidney injury Protein-calorie malnutrition Dysphagia  Acute hypoxemic respiratory failure: Patient remains ventilator dependent.  He is having worsening leukocytosis.  He is on 45% FiO2, 7 of PEEP. He initially had COVID-19 infection with pneumonia.  Subsequent bacterial pneumonia.  His respiratory cultures at the outside facility showed Pseudomonas aeruginosa.  He has received treatment with multiple antibiotics including IV vancomycin, meropenem, cefepime, ciprofloxacin at the acute facility.  After that he was subsequently switched to Azactam. However, had worsening leukocytosis worsening respiratory status. Chest x-ray showed diffuse dense bilateral pulmonary infiltrates. He also had mucous plugging and required suctioning. He got almost 7-10 days of Azactam but not much improvement. Therefore treated with meropenem.  However, he continued to have worsening therefore again treated with ceftazidime.  He improved with the ceftazidime with normalization of the white count. -However, as soon as the ceftazidime completed now having worsening leukocytosis again.  Suggest to send for repeat respiratory cultures, blood cultures.  Chest imaging ordered by the primary team.  Recommend to start treatment with meropenem.  Follow-up  on the culture results and adjust antibiotics accordingly.  Since he had COVID-19 infection sometimes that can rarely  cause parenchymal damage and with inhaled antibiotics there is a possibility of increased airway reactivity and worsening.  However, if he is not improving then we will have to consider tobramycin nebulizers. Pulmonary following.  Due to his chronic trach he is high risk for recurrent tracheobronchitis.  He also has dysphagia and high risk for recurrent aspiration pneumonia despite being on antibiotics.  Pneumonia: He had secondary bacterial pneumonia with Pseudomonas at the acute facility for which he was already treated.  He received treatment with multiple rounds of different antibiotics.  While antibiotics are on he seems to improve.  However, as soon as antibiotics are stopped he starts having worsening leukocytosis.  WBC count today worsened to 19.4.  Antibiotics as mentioned above. He has dysphagia and at risk for aspiration and recurrent aspiration pneumonia and tracheobronchitis despite being on antibiotics.  -He had thrombocytopenia at the acute facility the exact etiology for which was unclear for which reason beta-lactam's were discontinued although low suspicion. Please monitor counts closely while on antibiotics.  COVID-19 infection: Patient was treated for this at the outside facility.  He received IL-6 inhibitor.  He is high risk for ARDS from the COVID-19 infection.  He is status post Decadron.  He received treatment with hydroxyurea, folic acid, fish oil. He is at risk for ARDS from the COVID-19 infection.  Continue supportive management per the primary team.  Leukocytosis: Likely secondary to recurrent pneumonia probably from aspiration.  Antibiotics and plan as mentioned above. Monitor counts closely.  Due to his dysphagia, chronic trach he is high risk for recurrent tracheobronchitis and recurrent aspiration pneumonia and worsening leukocytosis despite being on  antibiotics.  Encephalopathy: Continue medications and supportive management by the primary team.  Bilateral lower extremity DVT: Further management per the primary team.  Patient also had anemia and required blood transfusion at the outside facility.  Continue to monitor closely.  Atrial fibrillation with RVR: Cardiology consulted.  Echocardiogram showed Left ventricular ejection fraction 50 to 55%. Continue medications and management per the primary team and cardiology.  Acute kidney injury: Patient was on dialysis at the acute facility.  At this time renal function appears to be stable.  Please monitor BUN/creatinine closely while on the antibiotics.  Avoid nephrotoxic medications.  Further management per the primary team.  Protein-calorie malnutrition/dysphagia: Management per primary team.  Due to his dysphagia he is high risk for aspiration and recurrent aspiration pneumonia despite being on antibiotics.  Due to his complex medical problems he is high risk for worsening and decompensation.  Plan of care discussed with the primary team and pharmacy.   Subjective: He is on 45% FiO2, 7 of PEEP.  Continues to have increased secretions.  Now with worsening leukocytosis again.  Objective: Vitals: Temperature 98.8, pulse 90, respiratory rate 24, blood pressure 136/74, pulse oximetry 96%   Examination: Constitutional: Ill-appearing male, on vent, not following commands at this time Head: Atraumatic, normocephalic Eyes: PERLA ENMT: Moist oral mucosa, poor dentition, no ear or nose lesions. Neck: Trach in place CVS: S1-S2 Respiratory: Coarse breath sounds, scattered rhonchi Abdomen: soft, positive bowel sounds  Musculoskeletal: Upper and lower extremity edema Neuro:  Severe debility with generalized weakness. Psych: stable Skin: no rashes   Data Reviewed: I have personally reviewed following labs and imaging studies  CBC: Recent Labs  Lab 01/26/21 0531 01/27/21 0432  01/28/21 0757 01/29/21 0351 02/01/21 0326  WBC 13.1* 13.4* 12.3* 10.7* 19.4*  HGB 8.0* 8.9* 8.3* 8.1* 8.7*  HCT 25.0* 28.0* 26.7* 25.8* 27.2*  MCV 95.8 96.6 99.3 96.6 96.5  PLT 390 414* 389 402* 424*    Basic Metabolic Panel: Recent Labs  Lab 01/26/21 0531 01/27/21 0432 01/28/21 0757 01/29/21 0351 01/30/21 0408 02/01/21 0326  NA 141 141 140 141  --  142  K 3.1* 3.5 5.0 3.2* 3.8 3.4*  CL 96* 99 99 98  --  97*  CO2 35* 31 29 33*  --  32  GLUCOSE 110* 124* 105* 111*  --  146*  BUN 15 14 13 13   --  9  CREATININE 0.49* 0.48* 0.54* 0.48*  --  0.47*  CALCIUM 8.7* 8.5* 8.6* 8.4*  --  8.3*  MG 1.9 1.7 2.0 1.8  --  1.8  PHOS 2.3* 3.7 3.5 3.2  --  4.3    GFR: CrCl cannot be calculated (Unknown ideal weight.).  Liver Function Tests: Recent Labs  Lab 01/28/21 0757 01/29/21 0351 02/01/21 0326  ALBUMIN 1.9* 1.9* 2.0*    CBG: No results for input(s): GLUCAP in the last 168 hours.   No results found for this or any previous visit (from the past 240 hour(s)).   Radiology Studies: DG Chest Port 1 View  Result Date: 02/01/2021 CLINICAL DATA:  Leukocytosis EXAM: PORTABLE CHEST 1 VIEW COMPARISON:  01/28/2021 FINDINGS: Unchanged cardiomegaly. Interval worsening of pulmonary vascular congestion and bilateral airspace opacities. Tracheostomy cannula again seen. Old healed right clavicular fracture again seen. Advanced degenerative changes of the visualized cervical spine. IMPRESSION: Interval worsening of bilateral airspace opacities most likely due to pulmonary edema. Multifocal pneumonia also possible. Electronically Signed   By: 03/28/2021 M.D.   On: 02/01/2021 13:59    Scheduled Meds: Please see MAR  04/01/2021, MD  02/01/2021, 4:15 PM

## 2021-02-01 NOTE — Progress Notes (Signed)
Pulmonary Critical Care Medicine [facility] [place]   PULMONARY CRITICAL CARE SERVICE  PROGRESS NOTE  Date of Service: 02/01/2021  Scott Roberts  KKX:[id number]  DOB: Oct 13, 1956   DOA: 12/27/2020  Referring Physician: [name], MD  HPI: Scott Roberts is a 65 y.o. adult seen for follow up of Acute on Chronic Respiratory Failure.  Patient is currently on full support on assist control supposed to be weaning for pressure support 16 hours  Medications: Reviewed on Rounds  Physical Exam:  Vitals: Temperature is 98.0 pulse 102 respiratory 30 blood pressure is 156/88 saturations 96%  Ventilator Settings on assist control FiO2 40% tidal volume 500 PEEP 7  . General: Comfortable at this time . Eyes: Grossly normal lids, irises & conjunctiva . ENT: grossly tongue is normal . Neck: no obvious mass . Cardiovascular: S1 S2 normal no gallop . Respiratory: Scattered rhonchi very coarse breath . Abdomen: soft . Skin: no rash seen on limited exam . Musculoskeletal: not rigid . Psychiatric:unable to assess . Neurologic: no seizure no involuntary movements         Lab Data:   Basic Metabolic Panel: Recent Labs  Lab 01/26/21 0531 01/27/21 0432 01/28/21 0757 01/29/21 0351 01/30/21 0408 02/01/21 0326  NA 141 141 140 141  --  142  K 3.1* 3.5 5.0 3.2* 3.8 3.4*  CL 96* 99 99 98  --  97*  CO2 35* 31 29 33*  --  32  GLUCOSE 110* 124* 105* 111*  --  146*  BUN 15 14 13 13   --  9  CREATININE 0.49* 0.48* 0.54* 0.48*  --  0.47*  CALCIUM 8.7* 8.5* 8.6* 8.4*  --  8.3*  MG 1.9 1.7 2.0 1.8  --  1.8  PHOS 2.3* 3.7 3.5 3.2  --  4.3    ABG: Recent Labs  Lab 01/26/21 1631 01/27/21 0456  PHART 7.456* 7.416  PCO2ART 50.9* 55.5*  PO2ART 59.2* 67.9*  HCO3 35.4* 35.3*  O2SAT 90.6 93.7    Liver Function Tests: Recent Labs  Lab 01/28/21 0757 01/29/21 0351 02/01/21 0326  ALBUMIN 1.9* 1.9* 2.0*   No results for input(s): LIPASE, AMYLASE in the last 168 hours. No results  for input(s): AMMONIA in the last 168 hours.  CBC: Recent Labs  Lab 01/26/21 0531 01/27/21 0432 01/28/21 0757 01/29/21 0351 02/01/21 0326  WBC 13.1* 13.4* 12.3* 10.7* 19.4*  HGB 8.0* 8.9* 8.3* 8.1* 8.7*  HCT 25.0* 28.0* 26.7* 25.8* 27.2*  MCV 95.8 96.6 99.3 96.6 96.5  PLT 390 414* 389 402* 424*    Cardiac Enzymes: No results for input(s): CKTOTAL, CKMB, CKMBINDEX, TROPONINI in the last 168 hours.  BNP (last 3 results) No results for input(s): BNP in the last 8760 hours.  ProBNP (last 3 results) No results for input(s): PROBNP in the last 8760 hours.  Radiological Exams: No results found.  Assessment/Plan Active Problems:   Acute on chronic respiratory failure with hypoxia (HCC)   COVID-19 virus infection   Acute respiratory distress syndrome (ARDS) due to COVID-19 virus [facility])   Healthcare associated bacterial pneumonia   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 Acute on chronic respiratory failure hypoxia we will continue with assist control weaning to pressure support today goal of 16 hours 2. COVID-19 virus infection recovery we will continue to follow 3. ARDS treated slow improvement 4. Healthcare associated pneumonia slowly improving 5. DVT treated   I have personally seen and evaluated the patient, evaluated laboratory  and imaging results, formulated the assessment and plan and placed orders. The Patient requires high complexity decision making with multiple systems involvement.  Rounds were done with the Respiratory Therapy Director and Staff therapists and discussed with nursing staff also.  Allyne Gee, MD [facility] Pulmonary Critical Care Medicine Sleep Medicine

## 2021-02-02 DIAGNOSIS — J159 Unspecified bacterial pneumonia: Secondary | ICD-10-CM | POA: Diagnosis not present

## 2021-02-02 DIAGNOSIS — U071 COVID-19: Secondary | ICD-10-CM | POA: Diagnosis not present

## 2021-02-02 DIAGNOSIS — J9621 Acute and chronic respiratory failure with hypoxia: Secondary | ICD-10-CM | POA: Diagnosis not present

## 2021-02-02 DIAGNOSIS — I87099 Postthrombotic syndrome with other complications of unspecified lower extremity: Secondary | ICD-10-CM | POA: Diagnosis not present

## 2021-02-02 LAB — URINALYSIS, ROUTINE W REFLEX MICROSCOPIC
Bilirubin Urine: NEGATIVE
Glucose, UA: NEGATIVE mg/dL
Hgb urine dipstick: NEGATIVE
Ketones, ur: NEGATIVE mg/dL
Leukocytes,Ua: NEGATIVE
Nitrite: NEGATIVE
Protein, ur: NEGATIVE mg/dL
Specific Gravity, Urine: 1.009 (ref 1.005–1.030)
pH: 7 (ref 5.0–8.0)

## 2021-02-02 LAB — POTASSIUM: Potassium: 3 mmol/L — ABNORMAL LOW (ref 3.5–5.1)

## 2021-02-02 LAB — LACTIC ACID, PLASMA: Lactic Acid, Venous: 0.9 mmol/L (ref 0.5–1.9)

## 2021-02-02 NOTE — Progress Notes (Signed)
Pulmonary Critical Care Medicine [facility] [place]   PULMONARY CRITICAL CARE SERVICE  PROGRESS NOTE  Date of Service: 02/02/2021  Scott Roberts  AJO:[id number]  DOB: 1956/07/04   DOA: 12/27/2020  Referring Physician: [name], MD  HPI: Scott Roberts is a 65 y.o. adult seen for follow up of Acute on Chronic Respiratory Failure.  Patient is not consistently weaning right now is on pressure support goal is for T collar today for at least 2 hours  Medications: Reviewed on Rounds  Physical Exam:  Vitals: Temperature is 97.5 pulse 88 respiratory 27 blood pressure is 111/61 saturations 97%  Ventilator Settings on pressure support for T collar  . General: Comfortable at this time . Eyes: Grossly normal lids, irises & conjunctiva . ENT: grossly tongue is normal . Neck: no obvious mass . Cardiovascular: S1 S2 normal no gallop . Respiratory: No rhonchi rales are noted at this time . Abdomen: soft . Skin: no rash seen on limited exam . Musculoskeletal: not rigid . Psychiatric:unable to assess . Neurologic: no seizure no involuntary movements         Lab Data:   Basic Metabolic Panel: Recent Labs  Lab 01/27/21 0432 01/28/21 0757 01/29/21 0351 01/30/21 0408 02/01/21 0326 02/02/21 0416  NA 141 140 141  --  142  --   K 3.5 5.0 3.2* 3.8 3.4* 3.0*  CL 99 99 98  --  97*  --   CO2 31 29 33*  --  32  --   GLUCOSE 124* 105* 111*  --  146*  --   BUN 14 13 13   --  9  --   CREATININE 0.48* 0.54* 0.48*  --  0.47*  --   CALCIUM 8.5* 8.6* 8.4*  --  8.3*  --   MG 1.7 2.0 1.8  --  1.8  --   PHOS 3.7 3.5 3.2  --  4.3  --     ABG: Recent Labs  Lab 01/26/21 1631 01/27/21 0456  PHART 7.456* 7.416  PCO2ART 50.9* 55.5*  PO2ART 59.2* 67.9*  HCO3 35.4* 35.3*  O2SAT 90.6 93.7    Liver Function Tests: Recent Labs  Lab 01/28/21 0757 01/29/21 0351 02/01/21 0326  ALBUMIN 1.9* 1.9* 2.0*   No results for input(s): LIPASE, AMYLASE in the last 168 hours. No results  for input(s): AMMONIA in the last 168 hours.  CBC: Recent Labs  Lab 01/27/21 0432 01/28/21 0757 01/29/21 0351 02/01/21 0326  WBC 13.4* 12.3* 10.7* 19.4*  HGB 8.9* 8.3* 8.1* 8.7*  HCT 28.0* 26.7* 25.8* 27.2*  MCV 96.6 99.3 96.6 96.5  PLT 414* 389 402* 424*    Cardiac Enzymes: No results for input(s): CKTOTAL, CKMB, CKMBINDEX, TROPONINI in the last 168 hours.  BNP (last 3 results) No results for input(s): BNP in the last 8760 hours.  ProBNP (last 3 results) No results for input(s): PROBNP in the last 8760 hours.  Radiological Exams: DG Chest Port 1 View  Result Date: 02/01/2021 CLINICAL DATA:  Leukocytosis EXAM: PORTABLE CHEST 1 VIEW COMPARISON:  01/28/2021 FINDINGS: Unchanged cardiomegaly. Interval worsening of pulmonary vascular congestion and bilateral airspace opacities. Tracheostomy cannula again seen. Old healed right clavicular fracture again seen. Advanced degenerative changes of the visualized cervical spine. IMPRESSION: Interval worsening of bilateral airspace opacities most likely due to pulmonary edema. Multifocal pneumonia also possible. Electronically Signed   By: 03/28/2021 M.D.   On: 02/01/2021 13:59    Assessment/Plan Active Problems:   Acute on chronic respiratory  failure with hypoxia (HCC)   COVID-19 virus infection   Acute respiratory distress syndrome (ARDS) due to COVID-19 virus [facility])   Healthcare associated bacterial pneumonia   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 Acute on chronic respiratory failure hypoxia we will continue with the T collar titrate oxygen continue pulmonary toilet. 2. COVID-19 virus infection recovery we will continue to follow along. 3. Healthcare associated pneumonia treated 4. Chronic venous hypertension DVT treated   I have personally seen and evaluated the patient, evaluated laboratory and imaging results, formulated the assessment and plan and placed orders. The Patient requires high  complexity decision making with multiple systems involvement.  Rounds were done with the Respiratory Therapy Director and Staff therapists and discussed with nursing staff also.  Yevonne Pax, MD [facility] Pulmonary Critical Care Medicine Sleep Medicine

## 2021-02-03 LAB — BLOOD CULTURE ID PANEL (REFLEXED) - BCID2

## 2021-02-03 LAB — CREATININE, SERUM
Creatinine, Ser: 0.41 mg/dL — ABNORMAL LOW (ref 0.61–1.24)
GFR, Estimated: >60 mL/min (ref >60)

## 2021-02-03 LAB — MAGNESIUM: Magnesium: 1.9 mg/dL (ref 1.7–2.4)

## 2021-02-03 LAB — BASIC METABOLIC PANEL
Anion gap: 12 (ref 5–15)
BUN: 10 mg/dL (ref 8–23)
CO2: 35 mmol/L — ABNORMAL HIGH (ref 22–32)
Calcium: 8.5 mg/dL — ABNORMAL LOW (ref 8.9–10.3)
Chloride: 96 mmol/L — ABNORMAL LOW (ref 98–111)
Creatinine, Ser: 0.43 mg/dL — ABNORMAL LOW (ref 0.61–1.24)
GFR, Estimated: >60 mL/min (ref >60)
Glucose, Bld: 111 mg/dL — ABNORMAL HIGH (ref 70–99)
Potassium: 3.5 mmol/L (ref 3.5–5.1)
Sodium: 143 mmol/L (ref 135–145)

## 2021-02-03 LAB — CBC
HCT: 26.8 % — ABNORMAL LOW (ref 39.0–52.0)
Hemoglobin: 8.1 g/dL — ABNORMAL LOW (ref 13.0–17.0)
MCH: 29.5 pg (ref 26.0–34.0)
MCHC: 30.2 g/dL (ref 30.0–36.0)
MCV: 97.5 fL (ref 80.0–100.0)
Platelets: 405 10*3/uL — ABNORMAL HIGH (ref 150–400)
RBC: 2.75 MIL/uL — ABNORMAL LOW (ref 4.22–5.81)
RDW: 17.6 % — ABNORMAL HIGH (ref 11.5–15.5)
WBC: 10.7 10*3/uL — ABNORMAL HIGH (ref 4.0–10.5)
nRBC: 0 % (ref 0.0–0.2)

## 2021-02-04 ENCOUNTER — Other Ambulatory Visit (HOSPITAL_COMMUNITY): Payer: Federal, State, Local not specified - PPO

## 2021-02-04 DIAGNOSIS — U071 COVID-19: Secondary | ICD-10-CM | POA: Diagnosis not present

## 2021-02-04 DIAGNOSIS — J9621 Acute and chronic respiratory failure with hypoxia: Secondary | ICD-10-CM | POA: Diagnosis not present

## 2021-02-04 DIAGNOSIS — J159 Unspecified bacterial pneumonia: Secondary | ICD-10-CM | POA: Diagnosis not present

## 2021-02-04 DIAGNOSIS — I87099 Postthrombotic syndrome with other complications of unspecified lower extremity: Secondary | ICD-10-CM | POA: Diagnosis not present

## 2021-02-04 LAB — CBC
HCT: 27.2 % — ABNORMAL LOW (ref 39.0–52.0)
Hemoglobin: 8.3 g/dL — ABNORMAL LOW (ref 13.0–17.0)
MCH: 29.5 pg (ref 26.0–34.0)
MCHC: 30.5 g/dL (ref 30.0–36.0)
MCV: 96.8 fL (ref 80.0–100.0)
Platelets: 390 10*3/uL (ref 150–400)
RBC: 2.81 MIL/uL — ABNORMAL LOW (ref 4.22–5.81)
RDW: 17.8 % — ABNORMAL HIGH (ref 11.5–15.5)
WBC: 11.3 10*3/uL — ABNORMAL HIGH (ref 4.0–10.5)
nRBC: 0.4 % — ABNORMAL HIGH (ref 0.0–0.2)

## 2021-02-04 LAB — URINE CULTURE: Culture: NO GROWTH

## 2021-02-04 LAB — CULTURE, BLOOD (ROUTINE X 2)

## 2021-02-04 LAB — CULTURE, RESPIRATORY W GRAM STAIN

## 2021-02-04 LAB — CREATININE, SERUM
Creatinine, Ser: 0.44 mg/dL — ABNORMAL LOW (ref 0.61–1.24)
GFR, Estimated: >60 mL/min (ref >60)

## 2021-02-04 LAB — BUN: BUN: 11 mg/dL (ref 8–23)

## 2021-02-04 NOTE — Progress Notes (Signed)
Pulmonary Critical Care Medicine [facility] [place]   PULMONARY CRITICAL CARE SERVICE  PROGRESS NOTE  Date of Service: 02/04/2021  Scott Roberts  UUV:[id number]  DOB: 1956/02/26   DOA: 12/27/2020  Referring Physician: [name], MD  HPI: Scott Roberts is a 65 y.o. adult seen for follow up of Acute on Chronic Respiratory Failure.  On T collar trials patient supposed to do 2 hours  Medications: Reviewed on Rounds  Physical Exam:  Vitals: Temperature is 97.6 pulse 75 respiratory 28 blood pressure is 146/80 saturations 98%  Ventilator Settings for T collar trials for 2 hours  . General: Comfortable at this time . Eyes: Grossly normal lids, irises & conjunctiva . ENT: grossly tongue is normal . Neck: no obvious mass . Cardiovascular: S1 S2 normal no gallop . Respiratory: No rhonchi very coarse breath sound . Abdomen: soft . Skin: no rash seen on limited exam . Musculoskeletal: not rigid . Psychiatric:unable to assess . Neurologic: no seizure no involuntary movements         Lab Data:   Basic Metabolic Panel: Recent Labs  Lab 01/29/21 0351 01/30/21 0408 02/01/21 0326 02/02/21 0416 02/03/21 0453 02/03/21 2052 02/04/21 0505  NA 141  --  142  --  143  --   --   K 3.2* 3.8 3.4* 3.0* 3.5  --   --   CL 98  --  97*  --  96*  --   --   CO2 33*  --  32  --  35*  --   --   GLUCOSE 111*  --  146*  --  111*  --   --   BUN 13  --  9  --  10  --  11  CREATININE 0.48*  --  0.47*  --  0.43* 0.41* 0.44*  CALCIUM 8.4*  --  8.3*  --  8.5*  --   --   MG 1.8  --  1.8  --  1.9  --   --   PHOS 3.2  --  4.3  --   --   --   --     ABG: No results for input(s): PHART, PCO2ART, PO2ART, HCO3, O2SAT in the last 168 hours.  Liver Function Tests: Recent Labs  Lab 01/29/21 0351 02/01/21 0326  ALBUMIN 1.9* 2.0*   No results for input(s): LIPASE, AMYLASE in the last 168 hours. No results for input(s): AMMONIA in the last 168 hours.  CBC: Recent Labs  Lab  01/29/21 0351 02/01/21 0326 02/03/21 0715 02/04/21 0505  WBC 10.7* 19.4* 10.7* 11.3*  HGB 8.1* 8.7* 8.1* 8.3*  HCT 25.8* 27.2* 26.8* 27.2*  MCV 96.6 96.5 97.5 96.8  PLT 402* 424* 405* 390    Cardiac Enzymes: No results for input(s): CKTOTAL, CKMB, CKMBINDEX, TROPONINI in the last 168 hours.  BNP (last 3 results) No results for input(s): BNP in the last 8760 hours.  ProBNP (last 3 results) No results for input(s): PROBNP in the last 8760 hours.  Radiological Exams: No results found.  Assessment/Plan Active Problems:   Acute on chronic respiratory failure with hypoxia (HCC)   COVID-19 virus infection   Acute respiratory distress syndrome (ARDS) due to COVID-19 virus [facility])   Healthcare associated bacterial pneumonia   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 Acute on chronic respiratory failure hypoxia we will continue with the T collar weaning as tolerated continue secretion management supportive care. 2. COVID-19 virus infection recovery phase  we will continue to follow 3. ARDS treated slowly improving 4. Healthcare associated pneumonia treated 5. 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 The Patient requires high complexity decision making with multiple systems involvement.  Rounds were done with the Respiratory Therapy Director and Staff therapists and discussed with nursing staff also.  Yevonne Pax, MD [facility] Pulmonary Critical Care Medicine Sleep Medicine

## 2021-02-05 ENCOUNTER — Other Ambulatory Visit (HOSPITAL_COMMUNITY): Payer: Federal, State, Local not specified - PPO

## 2021-02-05 DIAGNOSIS — J9621 Acute and chronic respiratory failure with hypoxia: Secondary | ICD-10-CM | POA: Diagnosis not present

## 2021-02-05 DIAGNOSIS — I87099 Postthrombotic syndrome with other complications of unspecified lower extremity: Secondary | ICD-10-CM | POA: Diagnosis not present

## 2021-02-05 DIAGNOSIS — J159 Unspecified bacterial pneumonia: Secondary | ICD-10-CM | POA: Diagnosis not present

## 2021-02-05 DIAGNOSIS — U071 COVID-19: Secondary | ICD-10-CM | POA: Diagnosis not present

## 2021-02-05 LAB — BASIC METABOLIC PANEL
Anion gap: 10 (ref 5–15)
BUN: 9 mg/dL (ref 8–23)
CO2: 37 mmol/L — ABNORMAL HIGH (ref 22–32)
Calcium: 8.6 mg/dL — ABNORMAL LOW (ref 8.9–10.3)
Chloride: 98 mmol/L (ref 98–111)
Creatinine, Ser: 0.4 mg/dL — ABNORMAL LOW (ref 0.61–1.24)
GFR, Estimated: >60 mL/min (ref >60)
Glucose, Bld: 108 mg/dL — ABNORMAL HIGH (ref 70–99)
Potassium: 2.6 mmol/L — CL (ref 3.5–5.1)
Sodium: 145 mmol/L (ref 135–145)

## 2021-02-05 LAB — VANCOMYCIN, TROUGH: Vancomycin Tr: 17 ug/mL (ref 15–20)

## 2021-02-05 NOTE — Progress Notes (Signed)
Pulmonary Critical Care Medicine [facility] [place]   PULMONARY CRITICAL CARE SERVICE  PROGRESS NOTE  Date of Service: 02/05/2021  Scott Roberts  HQI:[id number]  DOB: 27-Dec-1955   DOA: 12/27/2020  Referring Physician: [name], MD  HPI: Scott Roberts is a 65 y.o. adult seen for follow up of Acute on Chronic Respiratory Failure.  Patient is on T collar currently on 40% FiO2 the goal is for 4 hours  Medications: Reviewed on Rounds  Physical Exam:  Vitals: Temperature is 97.2 pulse 68 respiratory rate 25 blood pressure 141/69 saturations 97%  Ventilator Settings on T collar FiO2 40%  . General: Comfortable at this time . Eyes: Grossly normal lids, irises & conjunctiva . ENT: grossly tongue is normal . Neck: no obvious mass . Cardiovascular: S1 S2 normal no gallop . Respiratory: Scattered rhonchi coarse breath sounds . Abdomen: soft . Skin: no rash seen on limited exam . Musculoskeletal: not rigid . Psychiatric:unable to assess . Neurologic: no seizure no involuntary movements         Lab Data:   Basic Metabolic Panel: Recent Labs  Lab 01/30/21 0408 02/01/21 0326 02/02/21 0416 02/03/21 0453 02/03/21 2052 02/04/21 0505 02/05/21 0400  NA  --  142  --  143  --   --  145  K 3.8 3.4* 3.0* 3.5  --   --  2.6*  CL  --  97*  --  96*  --   --  98  CO2  --  32  --  35*  --   --  37*  GLUCOSE  --  146*  --  111*  --   --  108*  BUN  --  9  --  10  --  11 9  CREATININE  --  0.47*  --  0.43* 0.41* 0.44* 0.40*  CALCIUM  --  8.3*  --  8.5*  --   --  8.6*  MG  --  1.8  --  1.9  --   --   --   PHOS  --  4.3  --   --   --   --   --     ABG: No results for input(s): PHART, PCO2ART, PO2ART, HCO3, O2SAT in the last 168 hours.  Liver Function Tests: Recent Labs  Lab 02/01/21 0326  ALBUMIN 2.0*   No results for input(s): LIPASE, AMYLASE in the last 168 hours. No results for input(s): AMMONIA in the last 168 hours.  CBC: Recent Labs  Lab 02/01/21 0326  02/03/21 0715 02/04/21 0505  WBC 19.4* 10.7* 11.3*  HGB 8.7* 8.1* 8.3*  HCT 27.2* 26.8* 27.2*  MCV 96.5 97.5 96.8  PLT 424* 405* 390    Cardiac Enzymes: No results for input(s): CKTOTAL, CKMB, CKMBINDEX, TROPONINI in the last 168 hours.  BNP (last 3 results) No results for input(s): BNP in the last 8760 hours.  ProBNP (last 3 results) No results for input(s): PROBNP in the last 8760 hours.  Radiological Exams: DG Chest Port 1 View  Result Date: 02/05/2021 CLINICAL DATA:  Pulmonary edema.  Pneumonia. EXAM: PORTABLE CHEST 1 VIEW COMPARISON:  Four days ago FINDINGS: Tracheostomy tube in place. Diffuse pulmonary opacity with pleural effusions. Aeration appears generally improved. Chronic cardiomegaly. IMPRESSION: Airspace disease and pleural effusions. Probable generalized improvement in aeration. Electronically Signed   By: Marnee Spring M.D.   On: 02/05/2021 06:53   ECHOCARDIOGRAM LIMITED  Result Date: 02/04/2021    ECHOCARDIOGRAM LIMITED REPORT  Patient Name:   Scott Roberts Date of Exam: 02/04/2021 Medical Rec #:  [id number]      Height: Accession #:    [id number]     Weight: Date of Birth:  [birth year]      BSA: Patient Age:    64 years       BP:           0/0 mmHg Patient Gender: M              HR:           104 bpm. Exam Location:  Inpatient Procedure: Limited Echo, Color Doppler and Cardiac Doppler Indications:     Bacteremia  History:         Patient has prior history of Echocardiogram examinations, most                  recent 01/03/2021. COVID+ 10/2020.  Sonographer:     Irving Burton Senior RDCS Referring Phys:  [id number] PRIYA VARGHESE Diagnosing Phys: Orpah Cobb MD IMPRESSIONS  1. Left ventricular ejection fraction, by estimation, is 50 to 55%. The left ventricle has low normal function. The left ventricle demonstrates regional wall motion abnormalities (see scoring diagram/findings for description). Left ventricular diastolic  parameters are consistent with Grade I diastolic dysfunction  (impaired relaxation). There is mild hypokinesis of the left ventricular, entire lateral wall.  2. Right ventricular systolic function is normal. The right ventricular size is normal.  3. Left atrial size was mildly dilated.  4. Right atrial size was mildly dilated.  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 No evidence of mitral valve regurgitation. No evidence of mitral stenosis.  6. The aortic valve is tricuspid. There is mild calcification of the aortic valve. There is mild thickening of the aortic valve. Mild aortic valve sclerosis is present, with no evidence of aortic valve stenosis. FINDINGS  Left Ventricle: Left ventricular ejection fraction, by estimation, is 50 to 55%. The left ventricle has low normal function. The left ventricle demonstrates regional wall motion abnormalities. Mild hypokinesis of the left ventricular, entire lateral wall. The left ventricular internal cavity size was normal in size. There is no left ventricular hypertrophy. Left ventricular diastolic parameters are consistent with Grade I diastolic dysfunction (impaired relaxation). Right Ventricle: The right ventricular size is normal. No increase in right ventricular wall thickness. Right ventricular systolic function is normal. Left Atrium: Left atrial size was mildly dilated. Right Atrium: Right atrial size was mildly dilated. Pericardium: There is no evidence of pericardial effusion. Mitral Valve: The mitral valve is myxomatous. There is mild thickening of the mitral valve leaflet(s). There is mild calcification of the mitral valve leaflet(s). No evidence of mitral valve stenosis. Tricuspid Valve: The tricuspid valve is normal in structure. Tricuspid valve regurgitation is mild . No evidence of tricuspid stenosis. Aortic Valve: The aortic valve is tricuspid. There is mild calcification of the aortic valve. There is mild thickening of the aortic valve. Mild aortic valve sclerosis is present, with no evidence of aortic valve stenosis.  Pulmonic Valve: The pulmonic valve was normal in structure. Pulmonic valve regurgitation is not visualized. Aorta: The aortic root is normal in size and structure. Venous: The pulmonary veins were not well visualized. The inferior vena cava was not well visualized. IAS/Shunts: The interatrial septum was not assessed. 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 Acute on chronic respiratory failure with hypoxia we will continue with T collar titrate as tolerated. 2. COVID-19 virus infection recovery 3. ARDS slow improvement 4. Healthcare associated pneumonia treated with antibiotics 5. DVT treated we will continue to monitor   I have personally seen and evaluated the patient, evaluated laboratory and imaging results, formulated the assessment and plan and placed orders. The Patient requires high complexity decision making with multiple systems involvement.  Rounds were done with the Respiratory Therapy Director and Staff therapists and discussed with nursing staff also.  Yevonne Pax, MD [facility] Pulmonary Critical Care Medicine Sleep Medicine

## 2021-02-06 DIAGNOSIS — U071 COVID-19: Secondary | ICD-10-CM | POA: Diagnosis not present

## 2021-02-06 DIAGNOSIS — I87099 Postthrombotic syndrome with other complications of unspecified lower extremity: Secondary | ICD-10-CM | POA: Diagnosis not present

## 2021-02-06 DIAGNOSIS — J9621 Acute and chronic respiratory failure with hypoxia: Secondary | ICD-10-CM | POA: Diagnosis not present

## 2021-02-06 DIAGNOSIS — J159 Unspecified bacterial pneumonia: Secondary | ICD-10-CM | POA: Diagnosis not present

## 2021-02-06 LAB — BASIC METABOLIC PANEL
Anion gap: 11 (ref 5–15)
BUN: 10 mg/dL (ref 8–23)
CO2: 36 mmol/L — ABNORMAL HIGH (ref 22–32)
Calcium: 8.7 mg/dL — ABNORMAL LOW (ref 8.9–10.3)
Chloride: 95 mmol/L — ABNORMAL LOW (ref 98–111)
Creatinine, Ser: 0.37 mg/dL — ABNORMAL LOW (ref 0.61–1.24)
GFR, Estimated: >60 mL/min (ref >60)
Glucose, Bld: 106 mg/dL — ABNORMAL HIGH (ref 70–99)
Potassium: 3.4 mmol/L — ABNORMAL LOW (ref 3.5–5.1)
Sodium: 142 mmol/L (ref 135–145)

## 2021-02-06 NOTE — Progress Notes (Signed)
Pulmonary Critical Care Medicine [facility] [place]   PULMONARY CRITICAL CARE SERVICE  PROGRESS NOTE  Date of Service: 02/06/2021  Scott Roberts  QJJ:[id number]  DOB: [birth year]   DOA: 12/27/2020  Referring Physician: [name], MD  HPI: Scott Roberts is a 65 y.o. adult seen for follow up of Acute on Chronic Respiratory Failure.  Patient currently is on pressure support has been on 40% FiO2 on good saturations are noted at this time  Medications: Reviewed on Rounds  Physical Exam:  Vitals: Temperature 96.9 pulse 74 respiratory rate 25 blood pressure is 118/76 saturations 99%  Ventilator Settings on pressure support FiO2 40% pressure 12/7  . General: Comfortable at this time . Eyes: Grossly normal lids, irises & conjunctiva . ENT: grossly tongue is normal . Neck: no obvious mass . Cardiovascular: S1 S2 normal no gallop . Respiratory: Scattered rhonchi expansion is equal . Abdomen: soft . Skin: no rash seen on limited exam . Musculoskeletal: not rigid . Psychiatric:unable to assess . Neurologic: no seizure no involuntary movements         Lab Data:   Basic Metabolic Panel: Recent Labs  Lab 02/01/21 0326 02/02/21 0416 02/03/21 0453 02/03/21 2052 02/04/21 0505 02/05/21 0400 02/06/21 0346  NA 142  --  143  --   --  145 142  K 3.4* 3.0* 3.5  --   --  2.6* 3.4*  CL 97*  --  96*  --   --  98 95*  CO2 32  --  35*  --   --  37* 36*  GLUCOSE 146*  --  111*  --   --  108* 106*  BUN 9  --  10  --  11 9 10   CREATININE 0.47*  --  0.43* 0.41* 0.44* 0.40* 0.37*  CALCIUM 8.3*  --  8.5*  --   --  8.6* 8.7*  MG 1.8  --  1.9  --   --   --   --   PHOS 4.3  --   --   --   --   --   --     ABG: No results for input(s): PHART, PCO2ART, PO2ART, HCO3, O2SAT in the last 168 hours.  Liver Function Tests: Recent Labs  Lab 02/01/21 0326  ALBUMIN 2.0*   No results for input(s): LIPASE, AMYLASE in the last 168 hours. No results for input(s): AMMONIA in the last 168  hours.  CBC: Recent Labs  Lab 02/01/21 0326 02/03/21 0715 02/04/21 0505  WBC 19.4* 10.7* 11.3*  HGB 8.7* 8.1* 8.3*  HCT 27.2* 26.8* 27.2*  MCV 96.5 97.5 96.8  PLT 424* 405* 390    Cardiac Enzymes: No results for input(s): CKTOTAL, CKMB, CKMBINDEX, TROPONINI in the last 168 hours.  BNP (last 3 results) No results for input(s): BNP in the last 8760 hours.  ProBNP (last 3 results) No results for input(s): PROBNP in the last 8760 hours.  Radiological Exams: DG Chest Port 1 View  Result Date: 02/05/2021 CLINICAL DATA:  Pulmonary edema.  Pneumonia. EXAM: PORTABLE CHEST 1 VIEW COMPARISON:  Four days ago FINDINGS: Tracheostomy tube in place. Diffuse pulmonary opacity with pleural effusions. Aeration appears generally improved. Chronic cardiomegaly. IMPRESSION: Airspace disease and pleural effusions. Probable generalized improvement in aeration. Electronically Signed   By: 02/07/2021 M.D.   On: 02/05/2021 06:53   ECHOCARDIOGRAM LIMITED  Result Date: 02/04/2021    ECHOCARDIOGRAM LIMITED REPORT   Patient Name:   Scott Roberts Date  of Exam: 02/04/2021 Medical Rec #:  [id number]      Height: Accession #:    [id number]     Weight: Date of Birth:  04/13/1956      BSA: Patient Age:    64 years       BP:           0/0 mmHg Patient Gender: M              HR:           104 bpm. Exam Location:  Inpatient Procedure: Limited Echo, Color Doppler and Cardiac Doppler Indications:     Bacteremia  History:         Patient has prior history of Echocardiogram examinations, most                  recent 01/03/2021. COVID+ 10/2020.  Sonographer:     Irving Burton Senior RDCS Referring Phys:  [id number] PRIYA VARGHESE Diagnosing Phys: Orpah Cobb MD IMPRESSIONS  1. Left ventricular ejection fraction, by estimation, is 50 to 55%. The left ventricle has low normal function. The left ventricle demonstrates regional wall motion abnormalities (see scoring diagram/findings for description). Left ventricular diastolic  parameters are  consistent with Grade I diastolic dysfunction (impaired relaxation). There is mild hypokinesis of the left ventricular, entire lateral wall.  2. Right ventricular systolic function is normal. The right ventricular size is normal.  3. Left atrial size was mildly dilated.  4. Right atrial size was mildly dilated.  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 No evidence of mitral valve regurgitation. No evidence of mitral stenosis.  6. The aortic valve is tricuspid. There is mild calcification of the aortic valve. There is mild thickening of the aortic valve. Mild aortic valve sclerosis is present, with no evidence of aortic valve stenosis. FINDINGS  Left Ventricle: Left ventricular ejection fraction, by estimation, is 50 to 55%. The left ventricle has low normal function. The left ventricle demonstrates regional wall motion abnormalities. Mild hypokinesis of the left ventricular, entire lateral wall. The left ventricular internal cavity size was normal in size. There is no left ventricular hypertrophy. Left ventricular diastolic parameters are consistent with Grade I diastolic dysfunction (impaired relaxation). Right Ventricle: The right ventricular size is normal. No increase in right ventricular wall thickness. Right ventricular systolic function is normal. Left Atrium: Left atrial size was mildly dilated. Right Atrium: Right atrial size was mildly dilated. Pericardium: There is no evidence of pericardial effusion. Mitral Valve: The mitral valve is myxomatous. There is mild thickening of the mitral valve leaflet(s). There is mild calcification of the mitral valve leaflet(s). No evidence of mitral valve stenosis. Tricuspid Valve: The tricuspid valve is normal in structure. Tricuspid valve regurgitation is mild . No evidence of tricuspid stenosis. Aortic Valve: The aortic valve is tricuspid. There is mild calcification of the aortic valve. There is mild thickening of the aortic valve. Mild aortic valve sclerosis is present,  with no evidence of aortic valve stenosis. Pulmonic Valve: The pulmonic valve was normal in structure. Pulmonic valve regurgitation is not visualized. Aorta: The aortic root is normal in size and structure. Venous: The pulmonary veins were not well visualized. The inferior vena cava was not well visualized. IAS/Shunts: The interatrial septum was not assessed. 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 Acute on chronic respiratory failure hypoxia we will continue with pressure support titrate oxygen continue pulmonary toilet. 2. COVID-19 virus infection recovery 3. ARDS treated slow improvement 4. Healthcare associated pneumonia improving 5. Chronic DVT treated we will continue to follow   I have personally seen and evaluated the patient, evaluated laboratory and imaging results, formulated the assessment and plan and placed orders. The Patient requires high complexity decision making with multiple systems involvement.  Rounds were done with the Respiratory Therapy Director and Staff therapists and discussed with nursing staff also.  Yevonne Pax, MD [facility] Pulmonary Critical Care Medicine Sleep Medicine

## 2021-02-07 DIAGNOSIS — I87099 Postthrombotic syndrome with other complications of unspecified lower extremity: Secondary | ICD-10-CM | POA: Diagnosis not present

## 2021-02-07 DIAGNOSIS — J9621 Acute and chronic respiratory failure with hypoxia: Secondary | ICD-10-CM | POA: Diagnosis not present

## 2021-02-07 DIAGNOSIS — U071 COVID-19: Secondary | ICD-10-CM | POA: Diagnosis not present

## 2021-02-07 DIAGNOSIS — J159 Unspecified bacterial pneumonia: Secondary | ICD-10-CM | POA: Diagnosis not present

## 2021-02-07 LAB — CULTURE, BLOOD (ROUTINE X 2): Culture: NO GROWTH

## 2021-02-07 LAB — CREATININE, SERUM
Creatinine, Ser: 0.48 mg/dL — ABNORMAL LOW (ref 0.61–1.24)
GFR, Estimated: >60 mL/min (ref >60)

## 2021-02-07 LAB — BUN: BUN: 10 mg/dL (ref 8–23)

## 2021-02-07 LAB — POTASSIUM: Potassium: 3.4 mmol/L — ABNORMAL LOW (ref 3.5–5.1)

## 2021-02-07 NOTE — Progress Notes (Signed)
Pulmonary Critical Care Medicine [facility] [place]   PULMONARY CRITICAL CARE SERVICE  PROGRESS NOTE  Date of Service: 02/07/2021  Scott Roberts  BWL:[id number]  DOB: 06-09-1956   DOA: 12/27/2020  Referring Physician: [name], MD  HPI: Scott Roberts is a 65 y.o. adult seen for follow up of Acute on Chronic Respiratory Failure.  Patient is currently on T collar at the goal is for 4 hours was resting on the ventilator in assist control mode prior to starting  Medications: Reviewed on Rounds  Physical Exam:  Vitals: Temperature is 97.9 pulse 80 respiratory rate is 25 blood pressure is 158/97 saturations 96%  Ventilator Settings on T collar with an FiO2 of 40%  . General: Comfortable at this time . Eyes: Grossly normal lids, irises & conjunctiva . ENT: grossly tongue is normal . Neck: no obvious mass . Cardiovascular: S1 S2 normal no gallop . Respiratory: Scattered rhonchi very coarse breath sounds . Abdomen: soft . Skin: no rash seen on limited exam . Musculoskeletal: not rigid . Psychiatric:unable to assess . Neurologic: no seizure no involuntary movements         Lab Data:   Basic Metabolic Panel: Recent Labs  Lab 02/01/21 0326 02/02/21 0416 02/03/21 0453 02/03/21 2052 02/04/21 0505 02/05/21 0400 02/06/21 0346 02/07/21 0442  NA 142  --  143  --   --  145 142  --   K 3.4* 3.0* 3.5  --   --  2.6* 3.4* 3.4*  CL 97*  --  96*  --   --  98 95*  --   CO2 32  --  35*  --   --  37* 36*  --   GLUCOSE 146*  --  111*  --   --  108* 106*  --   BUN 9  --  10  --  11 9 10 10   CREATININE 0.47*  --  0.43* 0.41* 0.44* 0.40* 0.37* 0.48*  CALCIUM 8.3*  --  8.5*  --   --  8.6* 8.7*  --   MG 1.8  --  1.9  --   --   --   --   --   PHOS 4.3  --   --   --   --   --   --   --     ABG: No results for input(s): PHART, PCO2ART, PO2ART, HCO3, O2SAT in the last 168 hours.  Liver Function Tests: Recent Labs  Lab 02/01/21 0326  ALBUMIN 2.0*   No results for  input(s): LIPASE, AMYLASE in the last 168 hours. No results for input(s): AMMONIA in the last 168 hours.  CBC: Recent Labs  Lab 02/01/21 0326 02/03/21 0715 02/04/21 0505  WBC 19.4* 10.7* 11.3*  HGB 8.7* 8.1* 8.3*  HCT 27.2* 26.8* 27.2*  MCV 96.5 97.5 96.8  PLT 424* 405* 390    Cardiac Enzymes: No results for input(s): CKTOTAL, CKMB, CKMBINDEX, TROPONINI in the last 168 hours.  BNP (last 3 results) No results for input(s): BNP in the last 8760 hours.  ProBNP (last 3 results) No results for input(s): PROBNP in the last 8760 hours.  Radiological Exams: No results found.  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 Acute on chronic  respiratory failure hypoxia plan is to continue with weaning on T collar 4 hours 2. COVID-19 virus infection recovery 3. ARDS treated we will continue to follow 4. Healthcare associated pneumonia treated we will continue to follow 5. Chronic DVT no change   I have personally seen and evaluated the patient, evaluated laboratory and imaging results, formulated the assessment and plan and placed orders. The Patient requires high complexity decision making with multiple systems involvement.  Rounds were done with the Respiratory Therapy Director and Staff therapists and discussed with nursing staff also.  Yevonne Pax, MD [facility] Pulmonary Critical Care Medicine Sleep Medicine

## 2021-02-08 ENCOUNTER — Other Ambulatory Visit (HOSPITAL_COMMUNITY): Payer: Federal, State, Local not specified - PPO

## 2021-02-08 DIAGNOSIS — U071 COVID-19: Secondary | ICD-10-CM | POA: Diagnosis not present

## 2021-02-08 DIAGNOSIS — J159 Unspecified bacterial pneumonia: Secondary | ICD-10-CM | POA: Diagnosis not present

## 2021-02-08 DIAGNOSIS — I87099 Postthrombotic syndrome with other complications of unspecified lower extremity: Secondary | ICD-10-CM | POA: Diagnosis not present

## 2021-02-08 DIAGNOSIS — J9621 Acute and chronic respiratory failure with hypoxia: Secondary | ICD-10-CM | POA: Diagnosis not present

## 2021-02-08 LAB — COMPREHENSIVE METABOLIC PANEL
ALT: 29 U/L (ref 0–44)
AST: 43 U/L — ABNORMAL HIGH (ref 15–41)
Albumin: 2.3 g/dL — ABNORMAL LOW (ref 3.5–5.0)
Alkaline Phosphatase: 139 U/L — ABNORMAL HIGH (ref 38–126)
Anion gap: 9 (ref 5–15)
BUN: 15 mg/dL (ref 8–23)
CO2: 38 mmol/L — ABNORMAL HIGH (ref 22–32)
Calcium: 9.9 mg/dL (ref 8.9–10.3)
Chloride: 96 mmol/L — ABNORMAL LOW (ref 98–111)
Creatinine, Ser: 1.03 mg/dL (ref 0.61–1.24)
GFR, Estimated: >60 mL/min (ref >60)
Glucose, Bld: 130 mg/dL — ABNORMAL HIGH (ref 70–99)
Potassium: 3.7 mmol/L (ref 3.5–5.1)
Sodium: 143 mmol/L (ref 135–145)
Total Bilirubin: 0.5 mg/dL (ref 0.3–1.2)
Total Protein: 6.4 g/dL — ABNORMAL LOW (ref 6.5–8.1)

## 2021-02-08 LAB — BASIC METABOLIC PANEL
Anion gap: 12 (ref 5–15)
BUN: 14 mg/dL (ref 8–23)
CO2: 32 mmol/L (ref 22–32)
Calcium: 8.7 mg/dL — ABNORMAL LOW (ref 8.9–10.3)
Chloride: 97 mmol/L — ABNORMAL LOW (ref 98–111)
Creatinine, Ser: 0.97 mg/dL (ref 0.61–1.24)
GFR, Estimated: >60 mL/min (ref >60)
Glucose, Bld: 101 mg/dL — ABNORMAL HIGH (ref 70–99)
Potassium: 3.4 mmol/L — ABNORMAL LOW (ref 3.5–5.1)
Sodium: 141 mmol/L (ref 135–145)

## 2021-02-08 LAB — CBC
HCT: 29.2 % — ABNORMAL LOW (ref 39.0–52.0)
HCT: 33.3 % — ABNORMAL LOW (ref 39.0–52.0)
Hemoglobin: 9 g/dL — ABNORMAL LOW (ref 13.0–17.0)
Hemoglobin: 9.8 g/dL — ABNORMAL LOW (ref 13.0–17.0)
MCH: 30 pg (ref 26.0–34.0)
MCH: 29.7 pg (ref 26.0–34.0)
MCHC: 30.8 g/dL (ref 30.0–36.0)
MCHC: 29.4 g/dL — ABNORMAL LOW (ref 30.0–36.0)
MCV: 97.3 fL (ref 80.0–100.0)
MCV: 100.9 fL — ABNORMAL HIGH (ref 80.0–100.0)
Platelets: 382 10*3/uL (ref 150–400)
Platelets: 462 10*3/uL — ABNORMAL HIGH (ref 150–400)
RBC: 3 MIL/uL — ABNORMAL LOW (ref 4.22–5.81)
RBC: 3.3 MIL/uL — ABNORMAL LOW (ref 4.22–5.81)
RDW: 17.5 % — ABNORMAL HIGH (ref 11.5–15.5)
RDW: 17.6 % — ABNORMAL HIGH (ref 11.5–15.5)
WBC: 12.2 10*3/uL — ABNORMAL HIGH (ref 4.0–10.5)
WBC: 22.9 10*3/uL — ABNORMAL HIGH (ref 4.0–10.5)
nRBC: 0 % (ref 0.0–0.2)
nRBC: 0 % (ref 0.0–0.2)

## 2021-02-08 LAB — BLOOD GAS, ARTERIAL
Acid-Base Excess: 10.4 mmol/L — ABNORMAL HIGH (ref 0.0–2.0)
Bicarbonate: 38 mmol/L — ABNORMAL HIGH (ref 20.0–28.0)
FIO2: 100
O2 Saturation: 98.6 %
Patient temperature: 35.6
pCO2 arterial: 88.4 mmHg (ref 32.0–48.0)
pH, Arterial: 7.247 — ABNORMAL LOW (ref 7.350–7.450)
pO2, Arterial: 150 mmHg — ABNORMAL HIGH (ref 83.0–108.0)

## 2021-02-08 LAB — TROPONIN I (HIGH SENSITIVITY): Troponin I (High Sensitivity): 37 ng/L — ABNORMAL HIGH (ref <18)

## 2021-02-08 LAB — MAGNESIUM: Magnesium: 2.2 mg/dL (ref 1.7–2.4)

## 2021-02-08 LAB — BRAIN NATRIURETIC PEPTIDE: B Natriuretic Peptide: 179.5 pg/mL — ABNORMAL HIGH (ref 0.0–100.0)

## 2021-02-08 LAB — LACTIC ACID, PLASMA: Lactic Acid, Venous: 2.4 mmol/L (ref 0.5–1.9)

## 2021-02-08 NOTE — Progress Notes (Signed)
Pulmonary Critical Care Medicine [facility] [place]   PULMONARY CRITICAL CARE SERVICE  PROGRESS NOTE  Date of Service: 02/08/2021  Scott Roberts  [id number]:[id number]  DOB: 16-Sep-1956   DOA: 12/27/2020  Referring Physician: [name], MD  HPI: Scott Roberts is a 65 y.o. adult seen for follow up of Acute on Chronic Respiratory Failure.  Patient at this time is on assist control mode has been on 40% FiO2 with a PEEP of 7 this patient is supposed to wean on T collar for 12 hours  Medications: Reviewed on Rounds  Physical Exam:  Vitals: Temperature is 97.9 pulse 88 respiratory rate 25 blood pressure is 122/67 saturations 97%  Ventilator Settings currently assist control FiO2 40% tidal line 500 PEEP 7  . General: Comfortable at this time . Eyes: Grossly normal lids, irises & conjunctiva . ENT: grossly tongue is normal . Neck: no obvious mass . Cardiovascular: S1 S2 normal no gallop . Respiratory: Scattered rhonchi expansion equal . Abdomen: soft . Skin: no rash seen on limited exam . Musculoskeletal: not rigid . Psychiatric:unable to assess . Neurologic: no seizure no involuntary movements         Lab Data:   Basic Metabolic Panel: Recent Labs  Lab 02/03/21 0453 02/03/21 2052 02/04/21 0505 02/05/21 0400 02/06/21 0346 02/07/21 0442 02/08/21 0339  NA 143  --   --  145 142  --  141  K 3.5  --   --  2.6* 3.4* 3.4* 3.4*  CL 96*  --   --  98 95*  --  97*  CO2 35*  --   --  37* 36*  --  32  GLUCOSE 111*  --   --  108* 106*  --  101*  BUN 10  --  11 9 10 10 14   CREATININE 0.43*   < > 0.44* 0.40* 0.37* 0.48* 0.97  CALCIUM 8.5*  --   --  8.6* 8.7*  --  8.7*  MG 1.9  --   --   --   --   --   --    < > = values in this interval not displayed.    ABG: No results for input(s): PHART, PCO2ART, PO2ART, HCO3, O2SAT in the last 168 hours.  Liver Function Tests: No results for input(s): AST, ALT, ALKPHOS, BILITOT, PROT, ALBUMIN in the last 168 hours. No results for  input(s): LIPASE, AMYLASE in the last 168 hours. No results for input(s): AMMONIA in the last 168 hours.  CBC: Recent Labs  Lab 02/03/21 0715 02/04/21 0505 02/08/21 0339  WBC 10.7* 11.3* 12.2*  HGB 8.1* 8.3* 9.0*  HCT 26.8* 27.2* 29.2*  MCV 97.5 96.8 97.3  PLT 405* 390 382    Cardiac Enzymes: No results for input(s): CKTOTAL, CKMB, CKMBINDEX, TROPONINI in the last 168 hours.  BNP (last 3 results) No results for input(s): BNP in the last 8760 hours.  ProBNP (last 3 results) No results for input(s): PROBNP in the last 8760 hours.  Radiological Exams: No results found.  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 Acute on chronic respiratory failure hypoxia proceed to weaning T collar 12 hours 2. COVID-19 virus infection recovery 3. ARDS slow improvement 4. Healthcare associated pneumonia treated with antibiotics  infectious disease following 5. DVT treated   I have personally seen and evaluated the patient, evaluated laboratory and imaging results, formulated the assessment and plan and placed orders. The Patient requires high complexity decision making with multiple systems involvement.  Rounds were done with the Respiratory Therapy Director and Staff therapists and discussed with nursing staff also.  Yevonne Pax, MD [facility] Pulmonary Critical Care Medicine Sleep Medicine

## 2021-02-09 ENCOUNTER — Other Ambulatory Visit (HOSPITAL_COMMUNITY): Payer: Federal, State, Local not specified - PPO

## 2021-02-09 ENCOUNTER — Encounter (HOSPITAL_BASED_OUTPATIENT_CLINIC_OR_DEPARTMENT_OTHER): Payer: Federal, State, Local not specified - PPO

## 2021-02-09 DIAGNOSIS — Z9911 Dependence on respirator [ventilator] status: Secondary | ICD-10-CM

## 2021-02-09 DIAGNOSIS — J9621 Acute and chronic respiratory failure with hypoxia: Secondary | ICD-10-CM | POA: Diagnosis not present

## 2021-02-09 DIAGNOSIS — J80 Acute respiratory distress syndrome: Secondary | ICD-10-CM

## 2021-02-09 DIAGNOSIS — I87099 Postthrombotic syndrome with other complications of unspecified lower extremity: Secondary | ICD-10-CM | POA: Diagnosis not present

## 2021-02-09 DIAGNOSIS — U071 COVID-19: Secondary | ICD-10-CM

## 2021-02-09 DIAGNOSIS — J159 Unspecified bacterial pneumonia: Secondary | ICD-10-CM

## 2021-02-09 LAB — BLOOD GAS, ARTERIAL
Acid-Base Excess: 15.2 mmol/L — ABNORMAL HIGH (ref 0.0–2.0)
Bicarbonate: 39.4 mmol/L — ABNORMAL HIGH (ref 20.0–28.0)
FIO2: 50
O2 Saturation: 96.3 %
Patient temperature: 37
pCO2 arterial: 48.6 mmHg — ABNORMAL HIGH (ref 32.0–48.0)
pH, Arterial: 7.52 — ABNORMAL HIGH (ref 7.350–7.450)
pO2, Arterial: 75.3 mmHg — ABNORMAL LOW (ref 83.0–108.0)

## 2021-02-09 LAB — CULTURE, BLOOD (ROUTINE X 2)
Culture: NO GROWTH
Culture: NO GROWTH
Special Requests: ADEQUATE
Special Requests: ADEQUATE

## 2021-02-09 LAB — LACTIC ACID, PLASMA: Lactic Acid, Venous: 1 mmol/L (ref 0.5–1.9)

## 2021-02-09 NOTE — Consult Note (Signed)
Ref: Scott Alken, MD   Subjective:  Cardiac arrest following mucous plug, requiring CPR last night. He is on full ventilator support now with stable vital signs..   Objective:  Vital Signs in the last 24 hours:  P: 79, R: 33 BP: 109/60, O2 sat 99 % on 60 % FiO2, 500 TV and 25 A/C  Physical Exam: BP Readings from Last 1 Encounters:  No data found for BP     Wt Readings from Last 1 Encounters:  No data found for Wt    Weight change:  There is no height or weight on file to calculate BMI. HEENT: [place]/AT, Eyes-Blue, PERL, EOMI, Conjunctiva-Pink, Sclera-Non-icteric Neck: No JVD, No bruit, Trachea midline. Lungs:  Clearing, Bilateral. Cardiac:  Regular rhythm, normal S1 and S2, no S3. II/VI systolic murmur. Abdomen:  Soft, non-tender. BS present. Extremities:  1 + edema present. No cyanosis. No clubbing. CNS: AxOx0, Cranial nerves grossly intact, moves all 4 extremities.  Skin: Warm and dry.   Intake/Output from previous day: No intake/output data recorded.    Lab Results: BMET    Component Value Date/Time   NA 143 02/08/2021 2023   NA 141 02/08/2021 0339   NA 142 02/06/2021 0346   K 3.7 02/08/2021 2023   K 3.4 (L) 02/08/2021 0339   K 3.4 (L) 02/07/2021 0442   CL 96 (L) 02/08/2021 2023   CL 97 (L) 02/08/2021 0339   CL 95 (L) 02/06/2021 0346   CO2 38 (H) 02/08/2021 2023   CO2 32 02/08/2021 0339   CO2 36 (H) 02/06/2021 0346   GLUCOSE 130 (H) 02/08/2021 2023   GLUCOSE 101 (H) 02/08/2021 0339   GLUCOSE 106 (H) 02/06/2021 0346   BUN 15 02/08/2021 2023   BUN 14 02/08/2021 0339   BUN 10 02/07/2021 0442   CREATININE 1.03 02/08/2021 2023   CREATININE 0.97 02/08/2021 0339   CREATININE 0.48 (L) 02/07/2021 0442   CALCIUM 9.9 02/08/2021 2023   CALCIUM 8.7 (L) 02/08/2021 0339   CALCIUM 8.7 (L) 02/06/2021 0346   GFRNONAA >60 02/08/2021 2023   GFRNONAA >60 02/08/2021 0339   GFRNONAA >60 02/07/2021 0442   CBC    Component Value Date/Time   WBC 22.9 (H)  02/08/2021 2023   RBC 3.30 (L) 02/08/2021 2023   HGB 9.8 (L) 02/08/2021 2023   HCT 33.3 (L) 02/08/2021 2023   PLT 462 (H) 02/08/2021 2023   MCV 100.9 (H) 02/08/2021 2023   MCH 29.7 02/08/2021 2023   MCHC 29.4 (L) 02/08/2021 2023   RDW 17.6 (H) 02/08/2021 2023   HEPATIC Function Panel Recent Labs    12/29/20 0634 02/08/21 2023  PROT 5.7* 6.4*   HEMOGLOBIN A1C No components found for: HGA1C,  MPG CARDIAC ENZYMES No results found for: CKTOTAL, CKMB, CKMBINDEX, TROPONINI BNP No results for input(s): PROBNP in the last 8760 hours. TSH Recent Labs    12/29/20 0634  TSH 2.546   CHOLESTEROL No results for input(s): CHOL in the last 8760 hours.  Scheduled Meds: Continuous Infusions: PRN Meds:.  Assessment/Plan: Acute on chronic respiratory failure with hypoxia Paroxysmal atrial fibrillation COVID-19 pneumonia DVT PE Type 2 DM HLD Moderate MR Mild TR  Plan: No change in medications.   LOS: 0 days   Time spent including chart review, lab review, examination, discussion with patient :  min   Orpah Cobb  MD  02/09/2021, 9:07 PM

## 2021-02-09 NOTE — CV Procedure (Signed)
RUE venous duplex completed.  Results can be found under chart review under CV PROC. 02/09/2021 4:21 PM Riki Berninger RVT, RDMS

## 2021-02-09 NOTE — Progress Notes (Signed)
Pulmonary Critical Care Medicine [facility] [place]   PULMONARY CRITICAL CARE SERVICE  PROGRESS NOTE  Date of Service: 02/09/2021  Scott Roberts  FOY:[id number]  DOB: 11-10-1956   DOA: 12/27/2020  Referring Physician: [name], MD  HPI: Scott Roberts is a 65 y.o. adult seen for follow up of Acute on Chronic Respiratory Failure.  Remains on the ventilator and full support apparently yesterday developed mucous plug and a cardiac event patient did briefly require CPR now is back on the ventilator on assist control  Medications: Reviewed on Rounds  Physical Exam:  Vitals: Temperature is 97.7 pulse 74 respiratory 25 blood pressure is 113/66 saturations 98%  Ventilator Settings on assist control FiO2 70% tidal volume 500 PEEP 5  . General: Comfortable at this time . Eyes: Grossly normal lids, irises & conjunctiva . ENT: grossly tongue is normal . Neck: no obvious mass . Cardiovascular: S1 S2 normal no gallop . Respiratory: Scattered rhonchi expansion is equal . Abdomen: soft . Skin: no rash seen on limited exam . Musculoskeletal: not rigid . Psychiatric:unable to assess . Neurologic: no seizure no involuntary movements         Lab Data:   Basic Metabolic Panel: Recent Labs  Lab 02/03/21 0453 02/03/21 2052 02/05/21 0400 02/06/21 0346 02/07/21 0442 02/08/21 0339 02/08/21 2023  NA 143  --  145 142  --  141 143  K 3.5  --  2.6* 3.4* 3.4* 3.4* 3.7  CL 96*  --  98 95*  --  97* 96*  CO2 35*  --  37* 36*  --  32 38*  GLUCOSE 111*  --  108* 106*  --  101* 130*  BUN 10   < > 9 10 10 14 15   CREATININE 0.43*   < > 0.40* 0.37* 0.48* 0.97 1.03  CALCIUM 8.5*  --  8.6* 8.7*  --  8.7* 9.9  MG 1.9  --   --   --   --   --  2.2   < > = values in this interval not displayed.    ABG: Recent Labs  Lab 02/08/21 2007 02/09/21 0220  PHART 7.247* 7.520*  PCO2ART 88.4* 48.6*  PO2ART 150* 75.3*  HCO3 38.0* 39.4*  O2SAT 98.6 96.3    Liver Function Tests: Recent  Labs  Lab 02/08/21 2023  AST 43*  ALT 29  ALKPHOS 139*  BILITOT 0.5  PROT 6.4*  ALBUMIN 2.3*   No results for input(s): LIPASE, AMYLASE in the last 168 hours. No results for input(s): AMMONIA in the last 168 hours.  CBC: Recent Labs  Lab 02/03/21 0715 02/04/21 0505 02/08/21 0339 02/08/21 2023  WBC 10.7* 11.3* 12.2* 22.9*  HGB 8.1* 8.3* 9.0* 9.8*  HCT 26.8* 27.2* 29.2* 33.3*  MCV 97.5 96.8 97.3 100.9*  PLT 405* 390 382 462*    Cardiac Enzymes: No results for input(s): CKTOTAL, CKMB, CKMBINDEX, TROPONINI in the last 168 hours.  BNP (last 3 results) Recent Labs    02/08/21 2018  BNP 179.5*    ProBNP (last 3 results) No results for input(s): PROBNP in the last 8760 hours.  Radiological Exams: DG Chest Port 1 View  Result Date: 02/09/2021 CLINICAL DATA:  Pulmonary edema EXAM: PORTABLE CHEST 1 VIEW COMPARISON:  02/08/2021 FINDINGS: Tracheostomy is unchanged. Cardiomegaly. Bilateral airspace disease. Suspect small layering effusions. No real change since prior study IMPRESSION: Continued diffuse airspace disease and probable layering effusions. No real change. Electronically Signed   By: 02/10/2021  M.D.   On: 02/09/2021 06:24   DG CHEST PORT 1 VIEW  Result Date: 02/08/2021 CLINICAL DATA:  Respiratory distress EXAM: PORTABLE CHEST 1 VIEW COMPARISON:  02/05/2021 FINDINGS: Single frontal view of the chest demonstrates tracheostomy tube unchanged. External defibrillator pads overlie the chest. The cardiac silhouette is stable. Widespread bilateral airspace disease is unchanged. There are bilateral pleural effusions, increased on the right since prior study. No pneumothorax. No acute bony abnormalities. IMPRESSION: 1. Continued widespread bilateral airspace disease and pleural effusion, with enlargement of the right pleural effusion since prior study. Electronically Signed   By: Sharlet Salina M.D.   On: 02/08/2021 20:22    Assessment/Plan Active Problems:   Acute on chronic  respiratory failure with hypoxia (HCC)   COVID-19 virus infection   Acute respiratory distress syndrome (ARDS) due to COVID-19 virus [facility])   Healthcare associated bacterial pneumonia   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 Acute on chronic respiratory failure with hypoxia we will continue with full support today no weaning 2. COVID-19 virus infection in recovery 3. ARDS slow improvement we will continue to monitor 4. Healthcare associated pneumonia has been treated 5. Chronic DVT treated   I have personally seen and evaluated the patient, evaluated laboratory and imaging results, formulated the assessment and plan and placed orders. The Patient requires high complexity decision making with multiple systems involvement.  Rounds were done with the Respiratory Therapy Director and Staff therapists and discussed with nursing staff also.  Yevonne Pax, MD [facility] Pulmonary Critical Care Medicine Sleep Medicine

## 2021-02-10 DIAGNOSIS — U071 COVID-19: Secondary | ICD-10-CM | POA: Diagnosis not present

## 2021-02-10 DIAGNOSIS — J159 Unspecified bacterial pneumonia: Secondary | ICD-10-CM | POA: Diagnosis not present

## 2021-02-10 DIAGNOSIS — I87099 Postthrombotic syndrome with other complications of unspecified lower extremity: Secondary | ICD-10-CM | POA: Diagnosis not present

## 2021-02-10 DIAGNOSIS — J9621 Acute and chronic respiratory failure with hypoxia: Secondary | ICD-10-CM | POA: Diagnosis not present

## 2021-02-10 LAB — CBC
HCT: 28.8 % — ABNORMAL LOW (ref 39.0–52.0)
Hemoglobin: 9.1 g/dL — ABNORMAL LOW (ref 13.0–17.0)
MCH: 31 pg (ref 26.0–34.0)
MCHC: 31.6 g/dL (ref 30.0–36.0)
MCV: 98 fL (ref 80.0–100.0)
Platelets: 350 10*3/uL (ref 150–400)
RBC: 2.94 MIL/uL — ABNORMAL LOW (ref 4.22–5.81)
RDW: 17.6 % — ABNORMAL HIGH (ref 11.5–15.5)
WBC: 13 10*3/uL — ABNORMAL HIGH (ref 4.0–10.5)
nRBC: 0 % (ref 0.0–0.2)

## 2021-02-10 LAB — VANCOMYCIN, TROUGH
Vancomycin Tr: 50 ug/mL (ref 15–20)
Vancomycin Tr: 40 ug/mL (ref 15–20)

## 2021-02-10 LAB — BASIC METABOLIC PANEL
Anion gap: 10 (ref 5–15)
BUN: 38 mg/dL — ABNORMAL HIGH (ref 8–23)
CO2: 36 mmol/L — ABNORMAL HIGH (ref 22–32)
Calcium: 9.6 mg/dL (ref 8.9–10.3)
Chloride: 97 mmol/L — ABNORMAL LOW (ref 98–111)
Creatinine, Ser: 1.11 mg/dL (ref 0.61–1.24)
GFR, Estimated: >60 mL/min (ref >60)
Glucose, Bld: 129 mg/dL — ABNORMAL HIGH (ref 70–99)
Potassium: 3.9 mmol/L (ref 3.5–5.1)
Sodium: 143 mmol/L (ref 135–145)

## 2021-02-10 LAB — MAGNESIUM: Magnesium: 2.4 mg/dL (ref 1.7–2.4)

## 2021-02-10 NOTE — Consult Note (Signed)
Ref: Shayne Alken, MD   Subjective:  Resting comfortable. VS stable.  Objective:  Vital Signs in the last 24 hours:  P: 79, R: 25, O2 sat 100 %, BP: 133/80 Ventilator: 24 A/C, 500 TV 60 % FiO2, 7 PEEP.  Physical Exam: BP Readings from Last 1 Encounters:  No data found for BP     Wt Readings from Last 1 Encounters:  No data found for Wt    Weight change:  There is no height or weight on file to calculate BMI. HEENT: [place]/AT, Eyes-Blue, Conjunctiva-Pink, Sclera-Non-icteric Neck: No JVD, No bruit, Trachea midline. Lungs:  Clearing, Bilateral. Cardiac:  Regular rhythm, normal S1 and S2, no S3. II/VI systolic murmur. Abdomen:  Soft, non-tender. BS present. Extremities:  1 + edema present. No cyanosis. No clubbing. CNS: AxOx0, Cranial nerves grossly intact, moves all 4 extremities.  Skin: Warm and dry.   Intake/Output from previous day: No intake/output data recorded.    Lab Results: BMET    Component Value Date/Time   NA 143 02/10/2021 0543   NA 143 02/08/2021 2023   NA 141 02/08/2021 0339   K 3.9 02/10/2021 0543   K 3.7 02/08/2021 2023   K 3.4 (L) 02/08/2021 0339   CL 97 (L) 02/10/2021 0543   CL 96 (L) 02/08/2021 2023   CL 97 (L) 02/08/2021 0339   CO2 36 (H) 02/10/2021 0543   CO2 38 (H) 02/08/2021 2023   CO2 32 02/08/2021 0339   GLUCOSE 129 (H) 02/10/2021 0543   GLUCOSE 130 (H) 02/08/2021 2023   GLUCOSE 101 (H) 02/08/2021 0339   BUN 38 (H) 02/10/2021 0543   BUN 15 02/08/2021 2023   BUN 14 02/08/2021 0339   CREATININE 1.11 02/10/2021 0543   CREATININE 1.03 02/08/2021 2023   CREATININE 0.97 02/08/2021 0339   CALCIUM 9.6 02/10/2021 0543   CALCIUM 9.9 02/08/2021 2023   CALCIUM 8.7 (L) 02/08/2021 0339   GFRNONAA >60 02/10/2021 0543   GFRNONAA >60 02/08/2021 2023   GFRNONAA >60 02/08/2021 0339   CBC    Component Value Date/Time   WBC 13.0 (H) 02/10/2021 0543   RBC 2.94 (L) 02/10/2021 0543   HGB 9.1 (L) 02/10/2021 0543   HCT 28.8 (L) 02/10/2021  0543   PLT 350 02/10/2021 0543   MCV 98.0 02/10/2021 0543   MCH 31.0 02/10/2021 0543   MCHC 31.6 02/10/2021 0543   RDW 17.6 (H) 02/10/2021 0543   HEPATIC Function Panel Recent Labs    12/29/20 0634 02/08/21 2023  PROT 5.7* 6.4*   HEMOGLOBIN A1C No components found for: HGA1C,  MPG CARDIAC ENZYMES No results found for: CKTOTAL, CKMB, CKMBINDEX, TROPONINI BNP No results for input(s): PROBNP in the last 8760 hours. TSH Recent Labs    12/29/20 0634  TSH 2.546   CHOLESTEROL No results for input(s): CHOL in the last 8760 hours.  Scheduled Meds: Continuous Infusions: PRN Meds:.  Assessment/Plan: Acute on chronic respiratory failure with hypoxia Paroxysmal atrial fibrillation COVID-19 pneumonia DVT PE Type 2 DM HLD Moderate MR Mild TR  Plan: Continue medical treatment.   LOS: 0 days   Time spent including chart review, lab review, examination, discussion with patient :  min   Orpah Cobb  MD  02/10/2021, 12:05 PM

## 2021-02-10 NOTE — Progress Notes (Signed)
Pulmonary Critical Care Medicine [facility] [place]   PULMONARY CRITICAL CARE SERVICE  PROGRESS NOTE  Date of Service: 02/10/2021  Scott Roberts  RCV:[id number]  DOB: 1956/01/06   DOA: 12/27/2020  Referring Physician: [name], MD  HPI: Scott Roberts is a 65 y.o. adult seen for follow up of Acute on Chronic Respiratory Failure.  Patient at this time is assist control mode has been on 60% FiO2 with tidal volume of 500 and PEEP 7.  Secretions are still copious  Medications: Reviewed on Rounds  Physical Exam:  Vitals: Temperature 97.8 pulse 78 respiratory 25 blood pressure is 133/80 saturations 100%  Ventilator Settings on assist control FiO2 of 60% tidal volume 500 with a PEEP of 7  . General: Comfortable at this time . Eyes: Grossly normal lids, irises & conjunctiva . ENT: grossly tongue is normal . Neck: no obvious mass . Cardiovascular: S1 S2 normal no gallop . Respiratory: Coarse rhonchi expansion is equal . Abdomen: soft . Skin: no rash seen on limited exam . Musculoskeletal: not rigid . Psychiatric:unable to assess . Neurologic: no seizure no involuntary movements         Lab Data:   Basic Metabolic Panel: Recent Labs  Lab 02/05/21 0400 02/06/21 0346 02/07/21 0442 02/08/21 0339 02/08/21 2023 02/10/21 0543  NA 145 142  --  141 143 143  K 2.6* 3.4* 3.4* 3.4* 3.7 3.9  CL 98 95*  --  97* 96* 97*  CO2 37* 36*  --  32 38* 36*  GLUCOSE 108* 106*  --  101* 130* 129*  BUN 9 10 10 14 15  38*  CREATININE 0.40* 0.37* 0.48* 0.97 1.03 1.11  CALCIUM 8.6* 8.7*  --  8.7* 9.9 9.6  MG  --   --   --   --  2.2 2.4    ABG: Recent Labs  Lab 02/08/21 2007 02/09/21 0220  PHART 7.247* 7.520*  PCO2ART 88.4* 48.6*  PO2ART 150* 75.3*  HCO3 38.0* 39.4*  O2SAT 98.6 96.3    Liver Function Tests: Recent Labs  Lab 02/08/21 2023  AST 43*  ALT 29  ALKPHOS 139*  BILITOT 0.5  PROT 6.4*  ALBUMIN 2.3*   No results for input(s): LIPASE, AMYLASE in the last  168 hours. No results for input(s): AMMONIA in the last 168 hours.  CBC: Recent Labs  Lab 02/04/21 0505 02/08/21 0339 02/08/21 2023 02/10/21 0543  WBC 11.3* 12.2* 22.9* 13.0*  HGB 8.3* 9.0* 9.8* 9.1*  HCT 27.2* 29.2* 33.3* 28.8*  MCV 96.8 97.3 100.9* 98.0  PLT 390 382 462* 350    Cardiac Enzymes: No results for input(s): CKTOTAL, CKMB, CKMBINDEX, TROPONINI in the last 168 hours.  BNP (last 3 results) Recent Labs    02/08/21 2018  BNP 179.5*    ProBNP (last 3 results) No results for input(s): PROBNP in the last 8760 hours.  Radiological Exams: DG Chest Port 1 View  Result Date: 02/09/2021 CLINICAL DATA:  Pulmonary edema EXAM: PORTABLE CHEST 1 VIEW COMPARISON:  02/08/2021 FINDINGS: Tracheostomy is unchanged. Cardiomegaly. Bilateral airspace disease. Suspect small layering effusions. No real change since prior study IMPRESSION: Continued diffuse airspace disease and probable layering effusions. No real change. Electronically Signed   By: 02/10/2021 M.D.   On: 02/09/2021 06:24   DG CHEST PORT 1 VIEW  Result Date: 02/08/2021 CLINICAL DATA:  Respiratory distress EXAM: PORTABLE CHEST 1 VIEW COMPARISON:  02/05/2021 FINDINGS: Single frontal view of the chest demonstrates tracheostomy tube unchanged. External defibrillator  pads overlie the chest. The cardiac silhouette is stable. Widespread bilateral airspace disease is unchanged. There are bilateral pleural effusions, increased on the right since prior study. No pneumothorax. No acute bony abnormalities. IMPRESSION: 1. Continued widespread bilateral airspace disease and pleural effusion, with enlargement of the right pleural effusion since prior study. Electronically Signed   By: Sharlet Salina M.D.   On: 02/08/2021 20:22   VAS [place] UPPER EXTREMITY VENOUS DUPLEX  Result Date: 02/10/2021 UPPER VENOUS STUDY  Indications: Swelling Risk Factors: Previous midline in RUE - AC fossa area. Comparison Study: No previous exams. Performing  Technologist: Ernestene Mention  Examination Guidelines: A complete evaluation includes B-mode imaging, spectral Doppler, color Doppler, and power Doppler as needed of all accessible portions of each vessel. Bilateral testing is considered an integral part of a complete examination. Limited examinations for reoccurring indications may be performed as noted.  Right Findings: +----------+------------+---------+-----------+----------+-----------------+ RIGHT     CompressiblePhasicitySpontaneousProperties     Summary      +----------+------------+---------+-----------+----------+-----------------+ IJV           Full       Yes       Yes                                +----------+------------+---------+-----------+----------+-----------------+ Subclavian    Full       Yes       Yes                                +----------+------------+---------+-----------+----------+-----------------+ Axillary      Full       Yes       Yes                                +----------+------------+---------+-----------+----------+-----------------+ Brachial      Full       Yes       Yes                                +----------+------------+---------+-----------+----------+-----------------+ Radial        Full                                                    +----------+------------+---------+-----------+----------+-----------------+ Ulnar         Full                                                    +----------+------------+---------+-----------+----------+-----------------+ Cephalic      None       No        No               Age Indeterminate +----------+------------+---------+-----------+----------+-----------------+ Basilic       Full       Yes       Yes                                +----------+------------+---------+-----------+----------+-----------------+  Left Findings: +----------+------------+---------+-----------+----------+-------+ LEFT       CompressiblePhasicitySpontaneousPropertiesSummary +----------+------------+---------+-----------+----------+-------+ Subclavian    Full       Yes       Yes                      +----------+------------+---------+-----------+----------+-------+  *See table(s) above for measurements and observations.  Diagnosing physician: Heath Lark Electronically signed by Heath Lark on 02/10/2021 at 11:00:22 AM.    Final     Assessment/Plan Active Problems:   Acute on chronic respiratory failure with hypoxia (HCC)   COVID-19 virus infection   Acute respiratory distress syndrome (ARDS) due to COVID-19 virus [facility])   Healthcare associated bacterial pneumonia   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 Acute on chronic respiratory failure with hypoxia we will continue with assist control titrate oxygen continue pulmonary toilet. 2. COVID-19 virus infection recovery 3. ARDS treated we will continue to follow 4. Healthcare associated pneumonia has been improving slowly 5. Chronic  DVT treated   I have personally seen and evaluated the patient, evaluated laboratory and imaging results, formulated the assessment and plan and placed orders. The Patient requires high complexity decision making with multiple systems involvement.  Rounds were done with the Respiratory Therapy Director and Staff therapists and discussed with nursing staff also.  Yevonne Pax, MD [facility] Pulmonary Critical Care Medicine Sleep Medicine

## 2021-02-11 DIAGNOSIS — I87099 Postthrombotic syndrome with other complications of unspecified lower extremity: Secondary | ICD-10-CM | POA: Diagnosis not present

## 2021-02-11 DIAGNOSIS — J9621 Acute and chronic respiratory failure with hypoxia: Secondary | ICD-10-CM | POA: Diagnosis not present

## 2021-02-11 DIAGNOSIS — U071 COVID-19: Secondary | ICD-10-CM | POA: Diagnosis not present

## 2021-02-11 DIAGNOSIS — J159 Unspecified bacterial pneumonia: Secondary | ICD-10-CM | POA: Diagnosis not present

## 2021-02-11 LAB — CREATININE, SERUM
Creatinine, Ser: 1.08 mg/dL (ref 0.61–1.24)
GFR, Estimated: >60 mL/min (ref >60)

## 2021-02-11 LAB — CBC
HCT: 29.3 % — ABNORMAL LOW (ref 39.0–52.0)
Hemoglobin: 8.7 g/dL — ABNORMAL LOW (ref 13.0–17.0)
MCH: 29.6 pg (ref 26.0–34.0)
MCHC: 29.7 g/dL — ABNORMAL LOW (ref 30.0–36.0)
MCV: 99.7 fL (ref 80.0–100.0)
Platelets: 348 10*3/uL (ref 150–400)
RBC: 2.94 MIL/uL — ABNORMAL LOW (ref 4.22–5.81)
RDW: 17.2 % — ABNORMAL HIGH (ref 11.5–15.5)
WBC: 9.8 10*3/uL (ref 4.0–10.5)
nRBC: 0 % (ref 0.0–0.2)

## 2021-02-11 LAB — VANCOMYCIN, TROUGH: Vancomycin Tr: 32 ug/mL (ref 15–20)

## 2021-02-11 LAB — BUN: BUN: 46 mg/dL — ABNORMAL HIGH (ref 8–23)

## 2021-02-11 NOTE — Progress Notes (Signed)
Pulmonary Critical Care Medicine [facility] [place]   PULMONARY CRITICAL CARE SERVICE  PROGRESS NOTE  Date of Service: 02/11/2021  Scott Roberts  HFW:[id number]  DOB: 25-Mar-1956   DOA: 12/27/2020  Referring Physician: [name], MD  HPI: Scott Roberts is a 65 y.o. adult seen for follow up of Acute on Chronic Respiratory Failure.  Patient's been weaning on pressure support right now is on 50% FiO2 we will get a try to advance the wean  Medications: Reviewed on Rounds  Physical Exam:  Vitals: Temperature 97.8 pulse 76 respiratory 35 blood pressure is 150/91 saturations 100%  Ventilator Settings off ventilator for support but is on pressure support FiO2 50% pressure 12/5  . General: Comfortable at this time . Eyes: Grossly normal lids, irises & conjunctiva . ENT: grossly tongue is normal . Neck: no obvious mass . Cardiovascular: S1 S2 normal no gallop . Respiratory: No rhonchi very coarse breath sounds . Abdomen: soft . Skin: no rash seen on limited exam . Musculoskeletal: not rigid . Psychiatric:unable to assess . Neurologic: no seizure no involuntary movements         Lab Data:   Basic Metabolic Panel: Recent Labs  Lab 02/05/21 0400 02/06/21 0346 02/07/21 0442 02/08/21 0339 02/08/21 2023 02/10/21 0543 02/11/21 0501  NA 145 142  --  141 143 143  --   K 2.6* 3.4* 3.4* 3.4* 3.7 3.9  --   CL 98 95*  --  97* 96* 97*  --   CO2 37* 36*  --  32 38* 36*  --   GLUCOSE 108* 106*  --  101* 130* 129*  --   BUN 9 10 10 14 15  38* 46*  CREATININE 0.40* 0.37* 0.48* 0.97 1.03 1.11 1.08  CALCIUM 8.6* 8.7*  --  8.7* 9.9 9.6  --   MG  --   --   --   --  2.2 2.4  --     ABG: Recent Labs  Lab 02/08/21 2007 02/09/21 0220  PHART 7.247* 7.520*  PCO2ART 88.4* 48.6*  PO2ART 150* 75.3*  HCO3 38.0* 39.4*  O2SAT 98.6 96.3    Liver Function Tests: Recent Labs  Lab 02/08/21 2023  AST 43*  ALT 29  ALKPHOS 139*  BILITOT 0.5  PROT 6.4*  ALBUMIN 2.3*   No  results for input(s): LIPASE, AMYLASE in the last 168 hours. No results for input(s): AMMONIA in the last 168 hours.  CBC: Recent Labs  Lab 02/08/21 0339 02/08/21 2023 02/10/21 0543 02/11/21 0501  WBC 12.2* 22.9* 13.0* 9.8  HGB 9.0* 9.8* 9.1* 8.7*  HCT 29.2* 33.3* 28.8* 29.3*  MCV 97.3 100.9* 98.0 99.7  PLT 382 462* 350 348    Cardiac Enzymes: No results for input(s): CKTOTAL, CKMB, CKMBINDEX, TROPONINI in the last 168 hours.  BNP (last 3 results) Recent Labs    02/08/21 2018  BNP 179.5*    ProBNP (last 3 results) No results for input(s): PROBNP in the last 8760 hours.  Radiological Exams: VAS 2019 UPPER EXTREMITY VENOUS DUPLEX  Result Date: 02/10/2021 UPPER VENOUS STUDY  Indications: Swelling Risk Factors: Previous midline in RUE - AC fossa area. Comparison Study: No previous exams. Performing Technologist: 02/12/2021  Examination Guidelines: A complete evaluation includes B-mode imaging, spectral Doppler, color Doppler, and power Doppler as needed of all accessible portions of each vessel. Bilateral testing is considered an integral part of a complete examination. Limited examinations for reoccurring indications may be performed as noted.  Right  Findings: +----------+------------+---------+-----------+----------+-----------------+ RIGHT     CompressiblePhasicitySpontaneousProperties     Summary      +----------+------------+---------+-----------+----------+-----------------+ IJV           Full       Yes       Yes                                +----------+------------+---------+-----------+----------+-----------------+ Subclavian    Full       Yes       Yes                                +----------+------------+---------+-----------+----------+-----------------+ Axillary      Full       Yes       Yes                                +----------+------------+---------+-----------+----------+-----------------+ Brachial      Full       Yes       Yes                                 +----------+------------+---------+-----------+----------+-----------------+ Radial        Full                                                    +----------+------------+---------+-----------+----------+-----------------+ Ulnar         Full                                                    +----------+------------+---------+-----------+----------+-----------------+ Cephalic      None       No        No               Age Indeterminate +----------+------------+---------+-----------+----------+-----------------+ Basilic       Full       Yes       Yes                                +----------+------------+---------+-----------+----------+-----------------+  Left Findings: +----------+------------+---------+-----------+----------+-------+ LEFT      CompressiblePhasicitySpontaneousPropertiesSummary +----------+------------+---------+-----------+----------+-------+ Subclavian    Full       Yes       Yes                      +----------+------------+---------+-----------+----------+-------+  *See table(s) above for measurements and observations.  Diagnosing physician: Heath Lark Electronically signed by Heath Lark on 02/10/2021 at 11:00:22 AM.    Final     Assessment/Plan Active Problems:   Acute on chronic respiratory failure with hypoxia (HCC)   COVID-19 virus infection   Acute respiratory distress syndrome (ARDS) due to COVID-19 virus [facility])   Healthcare associated bacterial pneumonia   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 Acute on chronic respiratory failure hypoxia we will continue with the wean on pressure support currently  on 12/5 2. COVID-19 virus infection recovery phase we will continue to follow 3. ARDS treated slowly improving 4. Healthcare associated pneumonia treated 5. Chronic DVT supportive care anticoagulate   I have personally seen and evaluated the patient, evaluated laboratory and  imaging results, formulated the assessment and plan and placed orders. The Patient requires high complexity decision making with multiple systems involvement.  Rounds were done with the Respiratory Therapy Director and Staff therapists and discussed with nursing staff also.  Yevonne Pax, MD [facility] Pulmonary Critical Care Medicine Sleep Medicine

## 2021-02-12 ENCOUNTER — Other Ambulatory Visit (HOSPITAL_COMMUNITY): Payer: Federal, State, Local not specified - PPO

## 2021-02-12 DIAGNOSIS — U071 COVID-19: Secondary | ICD-10-CM | POA: Diagnosis not present

## 2021-02-12 DIAGNOSIS — J159 Unspecified bacterial pneumonia: Secondary | ICD-10-CM | POA: Diagnosis not present

## 2021-02-12 DIAGNOSIS — I87099 Postthrombotic syndrome with other complications of unspecified lower extremity: Secondary | ICD-10-CM | POA: Diagnosis not present

## 2021-02-12 DIAGNOSIS — J9621 Acute and chronic respiratory failure with hypoxia: Secondary | ICD-10-CM | POA: Diagnosis not present

## 2021-02-12 LAB — CBC
HCT: 31.1 % — ABNORMAL LOW (ref 39.0–52.0)
Hemoglobin: 9.4 g/dL — ABNORMAL LOW (ref 13.0–17.0)
MCH: 29.6 pg (ref 26.0–34.0)
MCHC: 30.2 g/dL (ref 30.0–36.0)
MCV: 97.8 fL (ref 80.0–100.0)
Platelets: 367 10*3/uL (ref 150–400)
RBC: 3.18 MIL/uL — ABNORMAL LOW (ref 4.22–5.81)
RDW: 16.8 % — ABNORMAL HIGH (ref 11.5–15.5)
WBC: 9.2 10*3/uL (ref 4.0–10.5)
nRBC: 0 % (ref 0.0–0.2)

## 2021-02-12 LAB — BASIC METABOLIC PANEL
Anion gap: 11 (ref 5–15)
BUN: 48 mg/dL — ABNORMAL HIGH (ref 8–23)
CO2: 36 mmol/L — ABNORMAL HIGH (ref 22–32)
Calcium: 9.6 mg/dL (ref 8.9–10.3)
Chloride: 96 mmol/L — ABNORMAL LOW (ref 98–111)
Creatinine, Ser: 1.1 mg/dL (ref 0.61–1.24)
GFR, Estimated: >60 mL/min (ref >60)
Glucose, Bld: 123 mg/dL — ABNORMAL HIGH (ref 70–99)
Potassium: 3.6 mmol/L (ref 3.5–5.1)
Sodium: 143 mmol/L (ref 135–145)

## 2021-02-12 LAB — VANCOMYCIN, TROUGH: Vancomycin Tr: 23 ug/mL (ref 15–20)

## 2021-02-12 NOTE — Progress Notes (Signed)
Pulmonary Critical Care Medicine [facility] [place]   PULMONARY CRITICAL CARE SERVICE  PROGRESS NOTE  Date of Service: 02/12/2021  Scott Roberts  ELF:[id number]  DOB: July 19, 1956   DOA: 12/27/2020  Referring Physician: [name], MD  HPI: Scott Roberts is a 65 y.o. adult seen for follow up of Acute on Chronic Respiratory Failure.  Patient at this time is off the ventilator on T collar has been requiring 45% FiO2 and the goal is for 12 hours  Medications: Reviewed on Rounds  Physical Exam:  Vitals: Temperature is 97.2 pulse 82 respiratory 30 blood pressure is 168/93 saturations 100%  Ventilator Settings on T collar FiO2 45%  . General: Comfortable at this time . Eyes: Grossly normal lids, irises & conjunctiva . ENT: grossly tongue is normal . Neck: no obvious mass . Cardiovascular: S1 S2 normal no gallop . Respiratory: Scattered rhonchi expansion is equal . Abdomen: soft . Skin: no rash seen on limited exam . Musculoskeletal: not rigid . Psychiatric:unable to assess . Neurologic: no seizure no involuntary movements         Lab Data:   Basic Metabolic Panel: Recent Labs  Lab 02/06/21 0346 02/07/21 0442 02/08/21 0339 02/08/21 2023 02/10/21 0543 02/11/21 0501 02/12/21 0441  NA 142  --  141 143 143  --  143  K 3.4* 3.4* 3.4* 3.7 3.9  --  3.6  CL 95*  --  97* 96* 97*  --  96*  CO2 36*  --  32 38* 36*  --  36*  GLUCOSE 106*  --  101* 130* 129*  --  123*  BUN 10 10 14 15  38* 46* 48*  CREATININE 0.37* 0.48* 0.97 1.03 1.11 1.08 1.10  CALCIUM 8.7*  --  8.7* 9.9 9.6  --  9.6  MG  --   --   --  2.2 2.4  --   --     ABG: Recent Labs  Lab 02/08/21 2007 02/09/21 0220  PHART 7.247* 7.520*  PCO2ART 88.4* 48.6*  PO2ART 150* 75.3*  HCO3 38.0* 39.4*  O2SAT 98.6 96.3    Liver Function Tests: Recent Labs  Lab 02/08/21 2023  AST 43*  ALT 29  ALKPHOS 139*  BILITOT 0.5  PROT 6.4*  ALBUMIN 2.3*   No results for input(s): LIPASE, AMYLASE in the last  168 hours. No results for input(s): AMMONIA in the last 168 hours.  CBC: Recent Labs  Lab 02/08/21 0339 02/08/21 2023 02/10/21 0543 02/11/21 0501 02/12/21 0441  WBC 12.2* 22.9* 13.0* 9.8 9.2  HGB 9.0* 9.8* 9.1* 8.7* 9.4*  HCT 29.2* 33.3* 28.8* 29.3* 31.1*  MCV 97.3 100.9* 98.0 99.7 97.8  PLT 382 462* 350 348 367    Cardiac Enzymes: No results for input(s): CKTOTAL, CKMB, CKMBINDEX, TROPONINI in the last 168 hours.  BNP (last 3 results) Recent Labs    02/08/21 2018  BNP 179.5*    ProBNP (last 3 results) No results for input(s): PROBNP in the last 8760 hours.  Radiological Exams: DG CHEST PORT 1 VIEW  Result Date: 02/12/2021 CLINICAL DATA:  Follow-up pleural effusion EXAM: PORTABLE CHEST 1 VIEW COMPARISON:  Three days ago FINDINGS: Hazy bilateral chest opacification which is both pulmonary and from overlapping pleural fluid. No visible pneumothorax. Tracheostomy tube in place. Stable heart size and mediastinal contours. IMPRESSION: Unchanged extensive airspace disease and pleural effusion. Electronically Signed   By: 02/14/2021 M.D.   On: 02/12/2021 06:47    Assessment/Plan Active Problems:  Acute on chronic respiratory failure with hypoxia (HCC)   COVID-19 virus infection   Acute respiratory distress syndrome (ARDS) due to COVID-19 virus [facility])   Healthcare associated bacterial pneumonia   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 Acute on chronic respiratory failure hypoxia we will continue to wean on T collar patient's goal of 16 hours 2. COVID-19 virus infection recovery 3. ARDS treated slow improvement 4. Healthcare associated pneumonia treated slowly improving 5. 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 The Patient requires high complexity decision making with multiple systems  involvement.  Rounds were done with the Respiratory Therapy Director and Staff therapists and discussed with nursing staff also.  Yevonne Pax, MD [facility] Pulmonary Critical Care Medicine Sleep Medicine

## 2021-02-13 DIAGNOSIS — J9621 Acute and chronic respiratory failure with hypoxia: Secondary | ICD-10-CM | POA: Diagnosis not present

## 2021-02-13 DIAGNOSIS — I87099 Postthrombotic syndrome with other complications of unspecified lower extremity: Secondary | ICD-10-CM | POA: Diagnosis not present

## 2021-02-13 DIAGNOSIS — J159 Unspecified bacterial pneumonia: Secondary | ICD-10-CM | POA: Diagnosis not present

## 2021-02-13 DIAGNOSIS — U071 COVID-19: Secondary | ICD-10-CM | POA: Diagnosis not present

## 2021-02-13 LAB — VANCOMYCIN, TROUGH: Vancomycin Tr: 15 ug/mL (ref 15–20)

## 2021-02-13 NOTE — Progress Notes (Signed)
Pulmonary Critical Care Medicine [facility] [place]   PULMONARY CRITICAL CARE SERVICE  PROGRESS NOTE  Date of Service: 02/13/2021  Scott Roberts  NWG:[id number]  DOB: 01-18-1956   DOA: 12/27/2020  Referring Physician: [name], MD  HPI: Scott Roberts is a 65 y.o. adult seen for follow up of Acute on Chronic Respiratory Failure.  Yesterday patient was able to do 12 hours of T collar today will be advancing further to 16 hours total of T collar  Medications: Reviewed on Rounds  Physical Exam:  Vitals: Temperature is 97.6 pulse 78 respiratory 25 blood pressure is 172/92 saturations 99%  Ventilator Settings on T collar with an FiO2 28%  . General: Comfortable at this time . Eyes: Grossly normal lids, irises & conjunctiva . ENT: grossly tongue is normal . Neck: no obvious mass . Cardiovascular: S1 S2 normal no gallop . Respiratory: Scattered rhonchi expansion is equal . Abdomen: soft . Skin: no rash seen on limited exam . Musculoskeletal: not rigid . Psychiatric:unable to assess . Neurologic: no seizure no involuntary movements         Lab Data:   Basic Metabolic Panel: Recent Labs  Lab 02/07/21 0442 02/08/21 0339 02/08/21 2023 02/10/21 0543 02/11/21 0501 02/12/21 0441  NA  --  141 143 143  --  143  K 3.4* 3.4* 3.7 3.9  --  3.6  CL  --  97* 96* 97*  --  96*  CO2  --  32 38* 36*  --  36*  GLUCOSE  --  101* 130* 129*  --  123*  BUN 10 14 15  38* 46* 48*  CREATININE 0.48* 0.97 1.03 1.11 1.08 1.10  CALCIUM  --  8.7* 9.9 9.6  --  9.6  MG  --   --  2.2 2.4  --   --     ABG: Recent Labs  Lab 02/08/21 2007 02/09/21 0220  PHART 7.247* 7.520*  PCO2ART 88.4* 48.6*  PO2ART 150* 75.3*  HCO3 38.0* 39.4*  O2SAT 98.6 96.3    Liver Function Tests: Recent Labs  Lab 02/08/21 2023  AST 43*  ALT 29  ALKPHOS 139*  BILITOT 0.5  PROT 6.4*  ALBUMIN 2.3*   No results for input(s): LIPASE, AMYLASE in the last 168 hours. No results for input(s): AMMONIA  in the last 168 hours.  CBC: Recent Labs  Lab 02/08/21 0339 02/08/21 2023 02/10/21 0543 02/11/21 0501 02/12/21 0441  WBC 12.2* 22.9* 13.0* 9.8 9.2  HGB 9.0* 9.8* 9.1* 8.7* 9.4*  HCT 29.2* 33.3* 28.8* 29.3* 31.1*  MCV 97.3 100.9* 98.0 99.7 97.8  PLT 382 462* 350 348 367    Cardiac Enzymes: No results for input(s): CKTOTAL, CKMB, CKMBINDEX, TROPONINI in the last 168 hours.  BNP (last 3 results) Recent Labs    02/08/21 2018  BNP 179.5*    ProBNP (last 3 results) No results for input(s): PROBNP in the last 8760 hours.  Radiological Exams: DG CHEST PORT 1 VIEW  Result Date: 02/12/2021 CLINICAL DATA:  Follow-up pleural effusion EXAM: PORTABLE CHEST 1 VIEW COMPARISON:  Three days ago FINDINGS: Hazy bilateral chest opacification which is both pulmonary and from overlapping pleural fluid. No visible pneumothorax. Tracheostomy tube in place. Stable heart size and mediastinal contours. IMPRESSION: Unchanged extensive airspace disease and pleural effusion. Electronically Signed   By: 02/14/2021 M.D.   On: 02/12/2021 06:47    Assessment/Plan Active Problems:   Acute on chronic respiratory failure with hypoxia (HCC)   COVID-19  virus infection   Acute respiratory distress syndrome (ARDS) due to COVID-19 virus [facility])   Healthcare associated bacterial pneumonia   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 Acute on chronic respiratory failure with hypoxia we will continue with T collar trials titrate oxygen for goal of 16 hours 2. COVID-19 virus infection recovery 3. ARDS very slow to improve 4. Healthcare associated pneumonia has been treated with antibiotics 5. Chronic DVT treated with anticoagulation   I have personally seen and evaluated the patient, evaluated laboratory and imaging results, formulated the assessment and plan and placed orders. The Patient requires high complexity decision making with multiple systems involvement.  Rounds were done  with the Respiratory Therapy Director and Staff therapists and discussed with nursing staff also.  Yevonne Pax, MD [facility] Pulmonary Critical Care Medicine Sleep Medicine

## 2021-02-14 DIAGNOSIS — J159 Unspecified bacterial pneumonia: Secondary | ICD-10-CM | POA: Diagnosis not present

## 2021-02-14 DIAGNOSIS — J9621 Acute and chronic respiratory failure with hypoxia: Secondary | ICD-10-CM | POA: Diagnosis not present

## 2021-02-14 DIAGNOSIS — U071 COVID-19: Secondary | ICD-10-CM | POA: Diagnosis not present

## 2021-02-14 DIAGNOSIS — I87099 Postthrombotic syndrome with other complications of unspecified lower extremity: Secondary | ICD-10-CM | POA: Diagnosis not present

## 2021-02-14 LAB — BUN: BUN: 46 mg/dL — ABNORMAL HIGH (ref 8–23)

## 2021-02-14 LAB — CREATININE, SERUM
Creatinine, Ser: 1.05 mg/dL (ref 0.61–1.24)
GFR, Estimated: >60 mL/min (ref >60)

## 2021-02-14 LAB — VANCOMYCIN, TROUGH: Vancomycin Tr: 34 ug/mL (ref 15–20)

## 2021-02-14 MED FILL — Medication: Qty: 1 | Status: AC

## 2021-02-14 NOTE — Progress Notes (Signed)
Pulmonary Critical Care Medicine [facility] [place]   PULMONARY CRITICAL CARE SERVICE  PROGRESS NOTE  Date of Service: 02/14/2021  Scott Roberts  GQQ:[id number]  DOB: November 30, 1956   DOA: 12/27/2020  Referring Physician: [name], MD  HPI: Scott Roberts is a 65 y.o. adult seen for follow up of Acute on Chronic Respiratory Failure.  Patient currently is on pressure support 12/7 was only able to do 3 hours of T collar yesterday  Medications: Reviewed on Rounds  Physical Exam:  Vitals: Temperature is 98.6 pulse 80 respiratory rate is 25 blood pressure is 142/78 saturations 96%  Ventilator Settings on pressure support FiO2 40% pressure 12/7  . General: Comfortable at this time . Eyes: Grossly normal lids, irises & conjunctiva . ENT: grossly tongue is normal . Neck: no obvious mass . Cardiovascular: S1 S2 normal no gallop . Respiratory: Scattered rhonchi expansion is equal . Abdomen: soft . Skin: no rash seen on limited exam . Musculoskeletal: not rigid . Psychiatric:unable to assess . Neurologic: no seizure no involuntary movements         Lab Data:   Basic Metabolic Panel: Recent Labs  Lab 02/08/21 0339 02/08/21 2023 02/10/21 0543 02/11/21 0501 02/12/21 0441 02/14/21 0444  NA 141 143 143  --  143  --   K 3.4* 3.7 3.9  --  3.6  --   CL 97* 96* 97*  --  96*  --   CO2 32 38* 36*  --  36*  --   GLUCOSE 101* 130* 129*  --  123*  --   BUN 14 15 38* 46* 48* 46*  CREATININE 0.97 1.03 1.11 1.08 1.10 1.05  CALCIUM 8.7* 9.9 9.6  --  9.6  --   MG  --  2.2 2.4  --   --   --     ABG: Recent Labs  Lab 02/08/21 2007 02/09/21 0220  PHART 7.247* 7.520*  PCO2ART 88.4* 48.6*  PO2ART 150* 75.3*  HCO3 38.0* 39.4*  O2SAT 98.6 96.3    Liver Function Tests: Recent Labs  Lab 02/08/21 2023  AST 43*  ALT 29  ALKPHOS 139*  BILITOT 0.5  PROT 6.4*  ALBUMIN 2.3*   No results for input(s): LIPASE, AMYLASE in the last 168 hours. No results for input(s):  AMMONIA in the last 168 hours.  CBC: Recent Labs  Lab 02/08/21 0339 02/08/21 2023 02/10/21 0543 02/11/21 0501 02/12/21 0441  WBC 12.2* 22.9* 13.0* 9.8 9.2  HGB 9.0* 9.8* 9.1* 8.7* 9.4*  HCT 29.2* 33.3* 28.8* 29.3* 31.1*  MCV 97.3 100.9* 98.0 99.7 97.8  PLT 382 462* 350 348 367    Cardiac Enzymes: No results for input(s): CKTOTAL, CKMB, CKMBINDEX, TROPONINI in the last 168 hours.  BNP (last 3 results) Recent Labs    02/08/21 2018  BNP 179.5*    ProBNP (last 3 results) No results for input(s): PROBNP in the last 8760 hours.  Radiological Exams: No results found.  Assessment/Plan Active Problems:   Acute on chronic respiratory failure with hypoxia (HCC)   COVID-19 virus infection   Acute respiratory distress syndrome (ARDS) due to COVID-19 virus [facility])   Healthcare associated bacterial pneumonia   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 Acute on chronic respiratory failure with hypoxia we will continue with trying to wean right now on pressure support we will try T collar again. 2. COVID-19 virus infection in recovery we will continue to follow 3. ARDS treated slowly improving  4. Healthcare associated pneumonia treated slow improvement 5. Chronic DVT anticoagulation   I have personally seen and evaluated the patient, evaluated laboratory and imaging results, formulated the assessment and plan and placed orders. The Patient requires high complexity decision making with multiple systems involvement.  Rounds were done with the Respiratory Therapy Director and Staff therapists and discussed with nursing staff also.  Yevonne Pax, MD [facility] Pulmonary Critical Care Medicine Sleep Medicine

## 2021-02-15 DIAGNOSIS — J159 Unspecified bacterial pneumonia: Secondary | ICD-10-CM | POA: Diagnosis not present

## 2021-02-15 DIAGNOSIS — I87099 Postthrombotic syndrome with other complications of unspecified lower extremity: Secondary | ICD-10-CM | POA: Diagnosis not present

## 2021-02-15 DIAGNOSIS — J9621 Acute and chronic respiratory failure with hypoxia: Secondary | ICD-10-CM | POA: Diagnosis not present

## 2021-02-15 DIAGNOSIS — U071 COVID-19: Secondary | ICD-10-CM | POA: Diagnosis not present

## 2021-02-15 LAB — MAGNESIUM: Magnesium: 2.3 mg/dL (ref 1.7–2.4)

## 2021-02-15 LAB — BASIC METABOLIC PANEL
Anion gap: 10 (ref 5–15)
BUN: 44 mg/dL — ABNORMAL HIGH (ref 8–23)
CO2: 33 mmol/L — ABNORMAL HIGH (ref 22–32)
Calcium: 9.4 mg/dL (ref 8.9–10.3)
Chloride: 99 mmol/L (ref 98–111)
Creatinine, Ser: 1.09 mg/dL (ref 0.61–1.24)
GFR, Estimated: >60 mL/min (ref >60)
Glucose, Bld: 108 mg/dL — ABNORMAL HIGH (ref 70–99)
Potassium: 4 mmol/L (ref 3.5–5.1)
Sodium: 142 mmol/L (ref 135–145)

## 2021-02-15 LAB — CBC
HCT: 25.7 % — ABNORMAL LOW (ref 39.0–52.0)
Hemoglobin: 8.3 g/dL — ABNORMAL LOW (ref 13.0–17.0)
MCH: 30.9 pg (ref 26.0–34.0)
MCHC: 32.3 g/dL (ref 30.0–36.0)
MCV: 95.5 fL (ref 80.0–100.0)
Platelets: 389 10*3/uL (ref 150–400)
RBC: 2.69 MIL/uL — ABNORMAL LOW (ref 4.22–5.81)
RDW: 17 % — ABNORMAL HIGH (ref 11.5–15.5)
WBC: 9.8 10*3/uL (ref 4.0–10.5)
nRBC: 0 % (ref 0.0–0.2)

## 2021-02-15 NOTE — Progress Notes (Signed)
Pulmonary Critical Care Medicine [facility] [place]   PULMONARY CRITICAL CARE SERVICE  PROGRESS NOTE  Date of Service: 02/15/2021  JAYLAND NULL  [id number]:[id number]  DOB: July 16, 1956   DOA: 12/27/2020  Referring Physician: [name], MD  HPI: RUMI TARAS is a 65 y.o. adult seen for follow up of Acute on Chronic Respiratory Failure.  Patient is on assist control mode currently on 40% FiO2 will be doing T collar for about 4 hours today  Medications: Reviewed on Rounds  Physical Exam:  Vitals: Temperature is 97.9 pulse 75 respiratory rate 24 blood pressure is 120/65 saturations 97%  Ventilator Settings on assist control FiO2 is 40% tidal volume 500 with a PEEP of 7  . General: Comfortable at this time . Eyes: Grossly normal lids, irises & conjunctiva . ENT: grossly tongue is normal . Neck: no obvious mass . Cardiovascular: S1 S2 normal no gallop . Respiratory: Scattered rhonchi expansion is equal at this time . Abdomen: soft . Skin: no rash seen on limited exam . Musculoskeletal: not rigid . Psychiatric:unable to assess . Neurologic: no seizure no involuntary movements         Lab Data:   Basic Metabolic Panel: Recent Labs  Lab 02/08/21 2023 02/10/21 0543 02/11/21 0501 02/12/21 0441 02/14/21 0444 02/15/21 0359  NA 143 143  --  143  --  142  K 3.7 3.9  --  3.6  --  4.0  CL 96* 97*  --  96*  --  99  CO2 38* 36*  --  36*  --  33*  GLUCOSE 130* 129*  --  123*  --  108*  BUN 15 38* 46* 48* 46* 44*  CREATININE 1.03 1.11 1.08 1.10 1.05 1.09  CALCIUM 9.9 9.6  --  9.6  --  9.4  MG 2.2 2.4  --   --   --  2.3    ABG: Recent Labs  Lab 02/08/21 2007 02/09/21 0220  PHART 7.247* 7.520*  PCO2ART 88.4* 48.6*  PO2ART 150* 75.3*  HCO3 38.0* 39.4*  O2SAT 98.6 96.3    Liver Function Tests: Recent Labs  Lab 02/08/21 2023  AST 43*  ALT 29  ALKPHOS 139*  BILITOT 0.5  PROT 6.4*  ALBUMIN 2.3*   No results for input(s): LIPASE, AMYLASE in the last 168  hours. No results for input(s): AMMONIA in the last 168 hours.  CBC: Recent Labs  Lab 02/08/21 2023 02/10/21 0543 02/11/21 0501 02/12/21 0441 02/15/21 0359  WBC 22.9* 13.0* 9.8 9.2 9.8  HGB 9.8* 9.1* 8.7* 9.4* 8.3*  HCT 33.3* 28.8* 29.3* 31.1* 25.7*  MCV 100.9* 98.0 99.7 97.8 95.5  PLT 462* 350 348 367 389    Cardiac Enzymes: No results for input(s): CKTOTAL, CKMB, CKMBINDEX, TROPONINI in the last 168 hours.  BNP (last 3 results) Recent Labs    02/08/21 2018  BNP 179.5*    ProBNP (last 3 results) No results for input(s): PROBNP in the last 8760 hours.  Radiological Exams: No results found.  Assessment/Plan Active Problems:   Acute on chronic respiratory failure with hypoxia (HCC)   COVID-19 virus infection   Acute respiratory distress syndrome (ARDS) due to COVID-19 virus [facility])   Healthcare associated bacterial pneumonia   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 Acute on chronic respiratory failure hypoxia we will continue with the weaning process patient will be doing T collar for 4 hours 2. COVID-19 virus infection recovery supportive care 3. ARDS treated  slowly improving 4. Healthcare associated pneumonia treated we will continue to monitor 5. Chronic DVT treated   I have personally seen and evaluated the patient, evaluated laboratory and imaging results, formulated the assessment and plan and placed orders. The Patient requires high complexity decision making with multiple systems involvement.  Rounds were done with the Respiratory Therapy Director and Staff therapists and discussed with nursing staff also.  Yevonne Pax, MD [facility] Pulmonary Critical Care Medicine Sleep Medicine

## 2021-02-16 DIAGNOSIS — U071 COVID-19: Secondary | ICD-10-CM | POA: Diagnosis not present

## 2021-02-16 DIAGNOSIS — J159 Unspecified bacterial pneumonia: Secondary | ICD-10-CM | POA: Diagnosis not present

## 2021-02-16 DIAGNOSIS — J9621 Acute and chronic respiratory failure with hypoxia: Secondary | ICD-10-CM | POA: Diagnosis not present

## 2021-02-16 DIAGNOSIS — I87099 Postthrombotic syndrome with other complications of unspecified lower extremity: Secondary | ICD-10-CM | POA: Diagnosis not present

## 2021-02-16 NOTE — Progress Notes (Signed)
Pulmonary Critical Care Medicine [facility] [place]   PULMONARY CRITICAL CARE SERVICE  PROGRESS NOTE  Date of Service: 02/16/2021  Scott Roberts  ELF:[id number]  DOB: [birth year]   DOA: 12/27/2020  Referring Physician: [name], MD  HPI: Scott Roberts is a 65 y.o. adult seen for follow up of Acute on Chronic Respiratory Failure.  Patient is on assist control mode on 40% FiO2 good saturations are noted  Medications: Reviewed on Rounds  Physical Exam:  Vitals: Temperature is 96.9 pulse 78 respiratory rate 36 blood pressure 145/81 saturations 99%  Ventilator Settings assist control FiO2 40% tidal line 500 PEEP of 5  . General: Comfortable at this time . Eyes: Grossly normal lids, irises & conjunctiva . ENT: grossly tongue is normal . Neck: no obvious mass . Cardiovascular: S1 S2 normal no gallop . Respiratory: Scattered rhonchi expansion is equal . Abdomen: soft . Skin: no rash seen on limited exam . Musculoskeletal: not rigid . Psychiatric:unable to assess . Neurologic: no seizure no involuntary movements         Lab Data:   Basic Metabolic Panel: Recent Labs  Lab 02/10/21 0543 02/11/21 0501 02/12/21 0441 02/14/21 0444 02/15/21 0359  NA 143  --  143  --  142  K 3.9  --  3.6  --  4.0  CL 97*  --  96*  --  99  CO2 36*  --  36*  --  33*  GLUCOSE 129*  --  123*  --  108*  BUN 38* 46* 48* 46* 44*  CREATININE 1.11 1.08 1.10 1.05 1.09  CALCIUM 9.6  --  9.6  --  9.4  MG 2.4  --   --   --  2.3    ABG: No results for input(s): PHART, PCO2ART, PO2ART, HCO3, O2SAT in the last 168 hours.  Liver Function Tests: No results for input(s): AST, ALT, ALKPHOS, BILITOT, PROT, ALBUMIN in the last 168 hours. No results for input(s): LIPASE, AMYLASE in the last 168 hours. No results for input(s): AMMONIA in the last 168 hours.  CBC: Recent Labs  Lab 02/10/21 0543 02/11/21 0501 02/12/21 0441 02/15/21 0359  WBC 13.0* 9.8 9.2 9.8  HGB 9.1* 8.7* 9.4* 8.3*   HCT 28.8* 29.3* 31.1* 25.7*  MCV 98.0 99.7 97.8 95.5  PLT 350 348 367 389    Cardiac Enzymes: No results for input(s): CKTOTAL, CKMB, CKMBINDEX, TROPONINI in the last 168 hours.  BNP (last 3 results) Recent Labs    02/08/21 2018  BNP 179.5*    ProBNP (last 3 results) No results for input(s): PROBNP in the last 8760 hours.  Radiological Exams: No results found.  Assessment/Plan Active Problems:   Acute on chronic respiratory failure with hypoxia (HCC)   COVID-19 virus infection   Acute respiratory distress syndrome (ARDS) due to COVID-19 virus [facility])   Healthcare associated bacterial pneumonia   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 Acute on chronic respiratory failure with hypoxia we will continue with the full support on the ventilator patient has intermittently been tolerating weaning on T collar respiratory therapy will reassess again today 2. COVID-19 virus infection recovery 3. ARDS treated we will continue to monitor 4. Healthcare associated pneumonia treated 5. DVT treated has been on anticoagulation   I have personally seen and evaluated the patient, evaluated laboratory and imaging results, formulated the assessment and plan and placed orders. The Patient requires high complexity decision making with multiple systems involvement.  Rounds were done with the Respiratory Therapy Director and Staff therapists and discussed with nursing staff also.  Allyne Gee, MD [facility] Pulmonary Critical Care Medicine Sleep Medicine

## 2021-02-17 DIAGNOSIS — U071 COVID-19: Secondary | ICD-10-CM | POA: Diagnosis not present

## 2021-02-17 DIAGNOSIS — J9621 Acute and chronic respiratory failure with hypoxia: Secondary | ICD-10-CM | POA: Diagnosis not present

## 2021-02-17 DIAGNOSIS — I87099 Postthrombotic syndrome with other complications of unspecified lower extremity: Secondary | ICD-10-CM | POA: Diagnosis not present

## 2021-02-17 DIAGNOSIS — J159 Unspecified bacterial pneumonia: Secondary | ICD-10-CM | POA: Diagnosis not present

## 2021-02-17 NOTE — Progress Notes (Signed)
Pulmonary Critical Care Medicine [facility] [place]   PULMONARY CRITICAL CARE SERVICE  PROGRESS NOTE  Date of Service: 02/17/2021  Scott Roberts  [id number]  DOB: 1956-09-01   DOA: 12/27/2020  Referring Physician: [name], MD  HPI: Scott Roberts is a 65 y.o. adult seen for follow up of Acute on Chronic Respiratory Failure.  Patient did not tolerate T collar was placed back on full support right now is on assist control mode  Medications: Reviewed on Rounds  Physical Exam:  Vitals: Temperature is 96.8 pulse 73 respiratory 25 blood pressure is 143/94 saturations 98%  Ventilator Settings on assist control FiO2 40% tidal volume 500 PEEP 7  . General: Comfortable at this time . Eyes: Grossly normal lids, irises & conjunctiva . ENT: grossly tongue is normal . Neck: no obvious mass . Cardiovascular: S1 S2 normal no gallop . Respiratory: No rhonchi very coarse breath sounds . Abdomen: soft . Skin: no rash seen on limited exam . Musculoskeletal: not rigid . Psychiatric:unable to assess . Neurologic: no seizure no involuntary movements         Lab Data:   Basic Metabolic Panel: Recent Labs  Lab 02/11/21 0501 02/12/21 0441 02/14/21 0444 02/15/21 0359  NA  --  143  --  142  K  --  3.6  --  4.0  CL  --  96*  --  99  CO2  --  36*  --  33*  GLUCOSE  --  123*  --  108*  BUN 46* 48* 46* 44*  CREATININE 1.08 1.10 1.05 1.09  CALCIUM  --  9.6  --  9.4  MG  --   --   --  2.3    ABG: No results for input(s): PHART, PCO2ART, PO2ART, HCO3, O2SAT in the last 168 hours.  Liver Function Tests: No results for input(s): AST, ALT, ALKPHOS, BILITOT, PROT, ALBUMIN in the last 168 hours. No results for input(s): LIPASE, AMYLASE in the last 168 hours. No results for input(s): AMMONIA in the last 168 hours.  CBC: Recent Labs  Lab 02/11/21 0501 02/12/21 0441 02/15/21 0359  WBC 9.8 9.2 9.8  HGB 8.7* 9.4* 8.3*  HCT 29.3* 31.1* 25.7*  MCV 99.7 97.8 95.5  PLT 348  367 389    Cardiac Enzymes: No results for input(s): CKTOTAL, CKMB, CKMBINDEX, TROPONINI in the last 168 hours.  BNP (last 3 results) Recent Labs    02/08/21 2018  BNP 179.5*    ProBNP (last 3 results) No results for input(s): PROBNP in the last 8760 hours.  Radiological Exams: No results found.  Assessment/Plan Active Problems:   Acute on chronic respiratory failure with hypoxia (HCC)   COVID-19 virus infection   Acute respiratory distress syndrome (ARDS) due to COVID-19 virus [facility])   Healthcare associated bacterial pneumonia   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 Acute on chronic respiratory failure with hypoxia we will continue with assist control titrate oxygen as tolerated 2. COVID-19 virus infection recovery 3. ARDS treated slow improvement 4. Healthcare associated pneumonia slowly improving 5. Chronic DVT no change   I have personally seen and evaluated the patient, evaluated laboratory and imaging results, formulated the assessment and plan and placed orders. The Patient requires high complexity decision making with multiple systems involvement.  Rounds were done with the Respiratory Therapy Director and Staff therapists and discussed with nursing staff also.  Yevonne Pax, MD [facility] Pulmonary Critical Care Medicine Sleep Medicine

## 2021-02-18 ENCOUNTER — Other Ambulatory Visit (HOSPITAL_COMMUNITY): Payer: Federal, State, Local not specified - PPO

## 2021-02-18 DIAGNOSIS — J159 Unspecified bacterial pneumonia: Secondary | ICD-10-CM | POA: Diagnosis not present

## 2021-02-18 DIAGNOSIS — U071 COVID-19: Secondary | ICD-10-CM | POA: Diagnosis not present

## 2021-02-18 DIAGNOSIS — J9621 Acute and chronic respiratory failure with hypoxia: Secondary | ICD-10-CM | POA: Diagnosis not present

## 2021-02-18 DIAGNOSIS — I87099 Postthrombotic syndrome with other complications of unspecified lower extremity: Secondary | ICD-10-CM | POA: Diagnosis not present

## 2021-02-18 LAB — CBC
HCT: 29 % — ABNORMAL LOW (ref 39.0–52.0)
Hemoglobin: 8.9 g/dL — ABNORMAL LOW (ref 13.0–17.0)
MCH: 29.6 pg (ref 26.0–34.0)
MCHC: 30.7 g/dL (ref 30.0–36.0)
MCV: 96.3 fL (ref 80.0–100.0)
Platelets: 400 10*3/uL (ref 150–400)
RBC: 3.01 MIL/uL — ABNORMAL LOW (ref 4.22–5.81)
RDW: 17 % — ABNORMAL HIGH (ref 11.5–15.5)
WBC: 11.2 10*3/uL — ABNORMAL HIGH (ref 4.0–10.5)
nRBC: 0 % (ref 0.0–0.2)

## 2021-02-18 LAB — CREATININE, SERUM
Creatinine, Ser: 1.18 mg/dL (ref 0.61–1.24)
GFR, Estimated: >60 mL/min (ref >60)

## 2021-02-18 LAB — BUN: BUN: 41 mg/dL — ABNORMAL HIGH (ref 8–23)

## 2021-02-18 NOTE — Progress Notes (Signed)
Pulmonary Critical Care Medicine [facility] [place]   PULMONARY CRITICAL CARE SERVICE  PROGRESS NOTE  Date of Service: 02/18/2021  Scott Roberts  [id number]  DOB: 05/27/1956   DOA: 12/27/2020  Referring Physician: [name], MD  HPI: Scott Roberts is a 65 y.o. adult seen for follow up of Acute on Chronic Respiratory Failure. Patient is comfortable right now without distress no fevers are noted. Has been attempted on T collar but failed once again  Medications: Reviewed on Rounds  Physical Exam:  Vitals: Temperature is 97.3 pulse 83 respiratory rate 31 blood pressure is 185/94 saturations 100%  Ventilator Settings currently on assist control FiO2 40% tidal volume 500 PEEP 7   General: Comfortable at this time  Eyes: Grossly normal lids, irises & conjunctiva  ENT: grossly tongue is normal  Neck: no obvious mass  Cardiovascular: S1 S2 normal no gallop  Respiratory: No rhonchi no rales are noted at this time  Abdomen: soft  Skin: no rash seen on limited exam  Musculoskeletal: not rigid  Psychiatric:unable to assess  Neurologic: no seizure no involuntary movements         Lab Data:   Basic Metabolic Panel: Recent Labs  Lab 02/12/21 0441 02/14/21 0444 02/15/21 0359 02/18/21 0500  NA 143  --  142  --   K 3.6  --  4.0  --   CL 96*  --  99  --   CO2 36*  --  33*  --   GLUCOSE 123*  --  108*  --   BUN 48* 46* 44* 41*  CREATININE 1.10 1.05 1.09 1.18  CALCIUM 9.6  --  9.4  --   MG  --   --  2.3  --     ABG: No results for input(s): PHART, PCO2ART, PO2ART, HCO3, O2SAT in the last 168 hours.  Liver Function Tests: No results for input(s): AST, ALT, ALKPHOS, BILITOT, PROT, ALBUMIN in the last 168 hours. No results for input(s): LIPASE, AMYLASE in the last 168 hours. No results for input(s): AMMONIA in the last 168 hours.  CBC: Recent Labs  Lab 02/12/21 0441 02/15/21 0359 02/18/21 0500  WBC 9.2 9.8 11.2*  HGB 9.4* 8.3* 8.9*  HCT  31.1* 25.7* 29.0*  MCV 97.8 95.5 96.3  PLT 367 389 400    Cardiac Enzymes: No results for input(s): CKTOTAL, CKMB, CKMBINDEX, TROPONINI in the last 168 hours.  BNP (last 3 results) Recent Labs    02/08/21 2018  BNP 179.5*    ProBNP (last 3 results) No results for input(s): PROBNP in the last 8760 hours.  Radiological Exams: DG Chest Port 1 View  Result Date: 02/18/2021 CLINICAL DATA:  65 year old male with respiratory failure. Status post COVID-19. EXAM: PORTABLE CHEST 1 VIEW COMPARISON:  Portable chest 02/12/2021 and earlier. FINDINGS: Portable AP semi upright view at 0650 hours. Stable tracheostomy. Stable lung volumes and mediastinal contours. Coarse and confluent bilateral pulmonary opacity persists. No pneumothorax. A degree of left lower lobe collapse is also suspected. Allowing and mild additional leftward rotation today, ventilation has not significantly changed. Stable visualized osseous structures. Chronic right clavicle deformity. Probable chronic right rib fractures. IMPRESSION: 1. Stable tracheostomy. 2. Ventilation not significantly changed over this series of exams with diffuse lung disease, suspected small pleural effusions. Electronically Signed   By: Odessa Fleming M.D.   On: 02/18/2021 07:53    Assessment/Plan Active Problems:   Acute on chronic respiratory failure with hypoxia (HCC)   COVID-19 virus infection  Acute respiratory distress syndrome (ARDS) due to COVID-19 virus [facility])   Healthcare associated bacterial pneumonia   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 Acute on chronic respiratory failure hypoxia we will continue with T collar trials titrate oxygen as tolerated continue pulmonary toilet. 2. ARDS treated slowly improving 3. Healthcare associated pneumonia treated we will continue to monitor 4. 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 COVID-19 virus infection in resolution   I have personally seen and evaluated the  patient, evaluated laboratory and imaging results, formulated the assessment and plan and placed orders. The Patient requires high complexity decision making with multiple systems involvement.  Rounds were done with the Respiratory Therapy Director and Staff therapists and discussed with nursing staff also.  Yevonne Pax, MD [facility] Pulmonary Critical Care Medicine Sleep Medicine

## 2021-02-19 DIAGNOSIS — J159 Unspecified bacterial pneumonia: Secondary | ICD-10-CM | POA: Diagnosis not present

## 2021-02-19 DIAGNOSIS — J9621 Acute and chronic respiratory failure with hypoxia: Secondary | ICD-10-CM | POA: Diagnosis not present

## 2021-02-19 DIAGNOSIS — I87099 Postthrombotic syndrome with other complications of unspecified lower extremity: Secondary | ICD-10-CM | POA: Diagnosis not present

## 2021-02-19 DIAGNOSIS — U071 COVID-19: Secondary | ICD-10-CM | POA: Diagnosis not present

## 2021-02-19 LAB — BASIC METABOLIC PANEL
Anion gap: 11 (ref 5–15)
BUN: 41 mg/dL — ABNORMAL HIGH (ref 8–23)
CO2: 32 mmol/L (ref 22–32)
Calcium: 9.3 mg/dL (ref 8.9–10.3)
Chloride: 103 mmol/L (ref 98–111)
Creatinine, Ser: 1.21 mg/dL (ref 0.61–1.24)
GFR, Estimated: >60 mL/min (ref >60)
Glucose, Bld: 113 mg/dL — ABNORMAL HIGH (ref 70–99)
Potassium: 3.5 mmol/L (ref 3.5–5.1)
Sodium: 146 mmol/L — ABNORMAL HIGH (ref 135–145)

## 2021-02-19 NOTE — Progress Notes (Signed)
Pulmonary Critical Care Medicine [facility] [place]   PULMONARY CRITICAL CARE SERVICE  PROGRESS NOTE  Date of Service: 02/19/2021  Scott Roberts  [id number]:[id number]  DOB: [birth year]   DOA: 12/27/2020  Referring Physician: [name], MD  HPI: Scott Roberts is a 65 y.o. adult seen for follow up of Acute on Chronic Respiratory Failure.  Patient at this time is afebrile comfortable remains on the ventilator assist control mode not tolerating weaning this morning.  Medications: Reviewed on Rounds  Physical Exam:  Vitals: Temperature is 96.7 pulse 74 respiratory 25 blood pressure is 146/88 saturations 98%  Ventilator Settings on assist control FiO2 40% tidal volume 500 PEEP of 7  . General: Comfortable at this time . Eyes: Grossly normal lids, irises & conjunctiva . ENT: grossly tongue is normal . Neck: no obvious mass . Cardiovascular: S1 S2 normal no gallop . Respiratory: No rhonchi no rales noted at this time . Abdomen: soft . Skin: no rash seen on limited exam . Musculoskeletal: not rigid . Psychiatric:unable to assess . Neurologic: no seizure no involuntary movements         Lab Data:   Basic Metabolic Panel: Recent Labs  Lab 02/14/21 0444 02/15/21 0359 02/18/21 0500 02/19/21 0418  NA  --  142  --  146*  K  --  4.0  --  3.5  CL  --  99  --  103  CO2  --  33*  --  32  GLUCOSE  --  108*  --  113*  BUN 46* 44* 41* 41*  CREATININE 1.05 1.09 1.18 1.21  CALCIUM  --  9.4  --  9.3  MG  --  2.3  --   --     ABG: No results for input(s): PHART, PCO2ART, PO2ART, HCO3, O2SAT in the last 168 hours.  Liver Function Tests: No results for input(s): AST, ALT, ALKPHOS, BILITOT, PROT, ALBUMIN in the last 168 hours. No results for input(s): LIPASE, AMYLASE in the last 168 hours. No results for input(s): AMMONIA in the last 168 hours.  CBC: Recent Labs  Lab 02/15/21 0359 02/18/21 0500  WBC 9.8 11.2*  HGB 8.3* 8.9*  HCT 25.7* 29.0*  MCV 95.5 96.3  PLT 389  400    Cardiac Enzymes: No results for input(s): CKTOTAL, CKMB, CKMBINDEX, TROPONINI in the last 168 hours.  BNP (last 3 results) Recent Labs    02/08/21 2018  BNP 179.5*    ProBNP (last 3 results) No results for input(s): PROBNP in the last 8760 hours.  Radiological Exams: DG Chest Port 1 View  Result Date: 02/18/2021 CLINICAL DATA:  65 year old male with respiratory failure. Status post COVID-19. EXAM: PORTABLE CHEST 1 VIEW COMPARISON:  Portable chest 02/12/2021 and earlier. FINDINGS: Portable AP semi upright view at 0650 hours. Stable tracheostomy. Stable lung volumes and mediastinal contours. Coarse and confluent bilateral pulmonary opacity persists. No pneumothorax. A degree of left lower lobe collapse is also suspected. Allowing and mild additional leftward rotation today, ventilation has not significantly changed. Stable visualized osseous structures. Chronic right clavicle deformity. Probable chronic right rib fractures. IMPRESSION: 1. Stable tracheostomy. 2. Ventilation not significantly changed over this series of exams with diffuse lung disease, suspected small pleural effusions. Electronically Signed   By: Odessa Fleming M.D.   On: 02/18/2021 07:53    Assessment/Plan Active Problems:   Acute on chronic respiratory failure with hypoxia (HCC)   COVID-19 virus infection   Acute respiratory distress syndrome (ARDS) due to COVID-19  virus [facility])   Healthcare associated bacterial pneumonia   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 Acute on chronic respiratory failure hypoxia plan is to continue with the weaning assessment try pressure support again today 2. COVID-19 virus infection we will continue to follow along 3. ARDS supportive care 4. Healthcare associated pneumonia has been treated with antibiotics 5. Chronic DVT on anticoagulation   I have personally seen and evaluated the patient, evaluated laboratory and imaging results, formulated the  assessment and plan and placed orders. The Patient requires high complexity decision making with multiple systems involvement.  Rounds were done with the Respiratory Therapy Director and Staff therapists and discussed with nursing staff also.  Yevonne Pax, MD [facility] Pulmonary Critical Care Medicine Sleep Medicine

## 2021-02-20 DIAGNOSIS — J159 Unspecified bacterial pneumonia: Secondary | ICD-10-CM | POA: Diagnosis not present

## 2021-02-20 DIAGNOSIS — J9621 Acute and chronic respiratory failure with hypoxia: Secondary | ICD-10-CM | POA: Diagnosis not present

## 2021-02-20 DIAGNOSIS — U071 COVID-19: Secondary | ICD-10-CM | POA: Diagnosis not present

## 2021-02-20 DIAGNOSIS — I87099 Postthrombotic syndrome with other complications of unspecified lower extremity: Secondary | ICD-10-CM | POA: Diagnosis not present

## 2021-02-20 LAB — RENAL FUNCTION PANEL
Albumin: 2.3 g/dL — ABNORMAL LOW (ref 3.5–5.0)
Anion gap: 11 (ref 5–15)
BUN: 43 mg/dL — ABNORMAL HIGH (ref 8–23)
CO2: 32 mmol/L (ref 22–32)
Calcium: 9.3 mg/dL (ref 8.9–10.3)
Chloride: 100 mmol/L (ref 98–111)
Creatinine, Ser: 1.23 mg/dL (ref 0.61–1.24)
GFR, Estimated: >60 mL/min (ref >60)
Glucose, Bld: 110 mg/dL — ABNORMAL HIGH (ref 70–99)
Phosphorus: 3.6 mg/dL (ref 2.5–4.6)
Potassium: 3.4 mmol/L — ABNORMAL LOW (ref 3.5–5.1)
Sodium: 143 mmol/L (ref 135–145)

## 2021-02-20 LAB — BLOOD GAS, ARTERIAL
Acid-Base Excess: 9.1 mmol/L — ABNORMAL HIGH (ref 0.0–2.0)
Bicarbonate: 33.4 mmol/L — ABNORMAL HIGH (ref 20.0–28.0)
FIO2: 30
O2 Saturation: 93 %
Patient temperature: 37
pCO2 arterial: 47.5 mmHg (ref 32.0–48.0)
pH, Arterial: 7.461 — ABNORMAL HIGH (ref 7.350–7.450)
pO2, Arterial: 67.8 mmHg — ABNORMAL LOW (ref 83.0–108.0)

## 2021-02-20 LAB — CBC
HCT: 28.2 % — ABNORMAL LOW (ref 39.0–52.0)
Hemoglobin: 8.5 g/dL — ABNORMAL LOW (ref 13.0–17.0)
MCH: 29.2 pg (ref 26.0–34.0)
MCHC: 30.1 g/dL (ref 30.0–36.0)
MCV: 96.9 fL (ref 80.0–100.0)
Platelets: 410 10*3/uL — ABNORMAL HIGH (ref 150–400)
RBC: 2.91 MIL/uL — ABNORMAL LOW (ref 4.22–5.81)
RDW: 17.1 % — ABNORMAL HIGH (ref 11.5–15.5)
WBC: 11.1 10*3/uL — ABNORMAL HIGH (ref 4.0–10.5)
nRBC: 0 % (ref 0.0–0.2)

## 2021-02-20 LAB — MAGNESIUM: Magnesium: 2.3 mg/dL (ref 1.7–2.4)

## 2021-02-20 NOTE — Progress Notes (Signed)
Pulmonary Critical Care Medicine [facility] [place]   PULMONARY CRITICAL CARE SERVICE  PROGRESS NOTE  Date of Service: 02/20/2021  MURREL BERTRAM  [id number]  DOB: 08-22-1956   DOA: 12/27/2020  Referring Physician: [name], MD  HPI: Scott Roberts is a 65 y.o. adult seen for follow up of Acute on Chronic Respiratory Failure.  Remains on the ventilator and full support at this time mechanics are poor not able to do any weaning.  Medications: Reviewed on Rounds  Physical Exam:  Vitals: Temperature is 98.2 pulse 65 respiratory 25 blood pressure is 150/90 saturations 96%  Ventilator Settings on assist control FiO2 35% tidal volume 500 PEEP of 7  . General: Comfortable at this time . Eyes: Grossly normal lids, irises & conjunctiva . ENT: grossly tongue is normal . Neck: no obvious mass . Cardiovascular: S1 S2 normal no gallop . Respiratory: Scattered rhonchi very coarse percent . Abdomen: soft . Skin: no rash seen on limited exam . Musculoskeletal: not rigid . Psychiatric:unable to assess . Neurologic: no seizure no involuntary movements         Lab Data:   Basic Metabolic Panel: Recent Labs  Lab 02/14/21 0444 02/15/21 0359 02/18/21 0500 02/19/21 0418 02/20/21 0446  NA  --  142  --  146* 143  K  --  4.0  --  3.5 3.4*  CL  --  99  --  103 100  CO2  --  33*  --  32 32  GLUCOSE  --  108*  --  113* 110*  BUN 46* 44* 41* 41* 43*  CREATININE 1.05 1.09 1.18 1.21 1.23  CALCIUM  --  9.4  --  9.3 9.3  MG  --  2.3  --   --  2.3  PHOS  --   --   --   --  3.6    ABG: No results for input(s): PHART, PCO2ART, PO2ART, HCO3, O2SAT in the last 168 hours.  Liver Function Tests: Recent Labs  Lab 02/20/21 0446  ALBUMIN 2.3*   No results for input(s): LIPASE, AMYLASE in the last 168 hours. No results for input(s): AMMONIA in the last 168 hours.  CBC: Recent Labs  Lab 02/15/21 0359 02/18/21 0500 02/20/21 0446  WBC 9.8 11.2* 11.1*  HGB 8.3* 8.9* 8.5*   HCT 25.7* 29.0* 28.2*  MCV 95.5 96.3 96.9  PLT 389 400 410*    Cardiac Enzymes: No results for input(s): CKTOTAL, CKMB, CKMBINDEX, TROPONINI in the last 168 hours.  BNP (last 3 results) Recent Labs    02/08/21 2018  BNP 179.5*    ProBNP (last 3 results) No results for input(s): PROBNP in the last 8760 hours.  Radiological Exams: No results found.  Assessment/Plan Active Problems:   Acute on chronic respiratory failure with hypoxia (HCC)   COVID-19 virus infection   Acute respiratory distress syndrome (ARDS) due to COVID-19 virus [facility])   Healthcare associated bacterial pneumonia   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 Acute on chronic respiratory failure hypoxia we will continue with the full support on the ventilator.  Patient's mechanics are poor we will have respiratory therapy continue to assess the mechanics. 2. COVID-19 virus infection recovery we will continue to monitor 3. ARDS due to COVID-19 treated slow improvement 4. Healthcare associated pneumonia slow improvement 5. Chronic DVT on anticoagulation   I have personally seen and evaluated the patient, evaluated laboratory and imaging results, formulated the assessment and plan and  placed orders. The Patient requires high complexity decision making with multiple systems involvement.  Rounds were done with the Respiratory Therapy Director and Staff therapists and discussed with nursing staff also.  Allyne Gee, MD [facility] Pulmonary Critical Care Medicine Sleep Medicine

## 2021-02-21 DIAGNOSIS — I87099 Postthrombotic syndrome with other complications of unspecified lower extremity: Secondary | ICD-10-CM | POA: Diagnosis not present

## 2021-02-21 DIAGNOSIS — J9621 Acute and chronic respiratory failure with hypoxia: Secondary | ICD-10-CM | POA: Diagnosis not present

## 2021-02-21 DIAGNOSIS — U071 COVID-19: Secondary | ICD-10-CM | POA: Diagnosis not present

## 2021-02-21 DIAGNOSIS — J159 Unspecified bacterial pneumonia: Secondary | ICD-10-CM | POA: Diagnosis not present

## 2021-02-21 LAB — BASIC METABOLIC PANEL
Anion gap: 13 (ref 5–15)
BUN: 39 mg/dL — ABNORMAL HIGH (ref 8–23)
CO2: 26 mmol/L (ref 22–32)
Calcium: 9.1 mg/dL (ref 8.9–10.3)
Chloride: 104 mmol/L (ref 98–111)
Creatinine, Ser: 1.15 mg/dL (ref 0.61–1.24)
GFR, Estimated: >60 mL/min (ref >60)
Glucose, Bld: 102 mg/dL — ABNORMAL HIGH (ref 70–99)
Potassium: 5 mmol/L (ref 3.5–5.1)
Sodium: 143 mmol/L (ref 135–145)

## 2021-02-21 LAB — PHOSPHORUS: Phosphorus: 4 mg/dL (ref 2.5–4.6)

## 2021-02-21 LAB — MAGNESIUM: Magnesium: 2.4 mg/dL (ref 1.7–2.4)

## 2021-02-21 NOTE — Progress Notes (Signed)
Pulmonary Critical Care Medicine [facility] [place]   PULMONARY CRITICAL CARE SERVICE  PROGRESS NOTE  Date of Service: 02/21/2021  ZIDAN HELGET  GLO:[id number]  DOB: [birth year]   DOA: 12/27/2020  Referring Physician: [name], MD  HPI: LONNELL CHAPUT is a 65 y.o. adult seen for follow up of Acute on Chronic Respiratory Failure.  Patient is afebrile right now comfortable without distress at this time.  Medications: Reviewed on Rounds  Physical Exam:  Vitals: Temperature is 97 pulse 66 respiratory rate 20 blood pressure is 162/87 saturations 98%  Ventilator Settings on assist control FiO2 28% tidal volume 500 PEEP of 7  . General: Comfortable at this time . Eyes: Grossly normal lids, irises & conjunctiva . ENT: grossly tongue is normal . Neck: no obvious mass . Cardiovascular: S1 S2 normal no gallop . Respiratory: Scattered rhonchi expansion is equal . Abdomen: soft . Skin: no rash seen on limited exam . Musculoskeletal: not rigid . Psychiatric:unable to assess . Neurologic: no seizure no involuntary movements         Lab Data:   Basic Metabolic Panel: Recent Labs  Lab 02/15/21 0359 02/18/21 0500 02/19/21 0418 02/20/21 0446 02/21/21 0503  NA 142  --  146* 143 143  K 4.0  --  3.5 3.4* 5.0  CL 99  --  103 100 104  CO2 33*  --  32 32 26  GLUCOSE 108*  --  113* 110* 102*  BUN 44* 41* 41* 43* 39*  CREATININE 1.09 1.18 1.21 1.23 1.15  CALCIUM 9.4  --  9.3 9.3 9.1  MG 2.3  --   --  2.3 2.4  PHOS  --   --   --  3.6 4.0    ABG: Recent Labs  Lab 02/20/21 0940  PHART 7.461*  PCO2ART 47.5  PO2ART 67.8*  HCO3 33.4*  O2SAT 93.0    Liver Function Tests: Recent Labs  Lab 02/20/21 0446  ALBUMIN 2.3*   No results for input(s): LIPASE, AMYLASE in the last 168 hours. No results for input(s): AMMONIA in the last 168 hours.  CBC: Recent Labs  Lab 02/15/21 0359 02/18/21 0500 02/20/21 0446  WBC 9.8 11.2* 11.1*  HGB 8.3* 8.9* 8.5*  HCT 25.7*  29.0* 28.2*  MCV 95.5 96.3 96.9  PLT 389 400 410*    Cardiac Enzymes: No results for input(s): CKTOTAL, CKMB, CKMBINDEX, TROPONINI in the last 168 hours.  BNP (last 3 results) Recent Labs    02/08/21 2018  BNP 179.5*    ProBNP (last 3 results) No results for input(s): PROBNP in the last 8760 hours.  Radiological Exams: No results found.  Assessment/Plan Active Problems:   Acute on chronic respiratory failure with hypoxia (HCC)   COVID-19 virus infection   Acute respiratory distress syndrome (ARDS) due to COVID-19 virus [facility])   Healthcare associated bacterial pneumonia   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 Acute on chronic respiratory failure hypoxia we will continue with assist control on 28% FiO2 continue secretion management supportive care. 2. COVID-19 virus infection recovery we will continue to follow 3. ARDS treated slow improvement 4. Healthcare associated pneumonia no change 5. 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 The Patient requires high complexity decision making with multiple systems involvement.  Rounds were done with the Respiratory Therapy Director and Staff therapists and  discussed with nursing staff also.  Allyne Gee, MD [facility] Pulmonary Critical Care Medicine Sleep Medicine

## 2021-02-22 ENCOUNTER — Other Ambulatory Visit (HOSPITAL_COMMUNITY): Payer: Federal, State, Local not specified - PPO

## 2021-02-22 DIAGNOSIS — U071 COVID-19: Secondary | ICD-10-CM | POA: Diagnosis not present

## 2021-02-22 DIAGNOSIS — J9621 Acute and chronic respiratory failure with hypoxia: Secondary | ICD-10-CM | POA: Diagnosis not present

## 2021-02-22 DIAGNOSIS — J159 Unspecified bacterial pneumonia: Secondary | ICD-10-CM | POA: Diagnosis not present

## 2021-02-22 DIAGNOSIS — I87099 Postthrombotic syndrome with other complications of unspecified lower extremity: Secondary | ICD-10-CM | POA: Diagnosis not present

## 2021-02-22 LAB — RENAL FUNCTION PANEL
Albumin: 2.6 g/dL — ABNORMAL LOW (ref 3.5–5.0)
Anion gap: 12 (ref 5–15)
BUN: 42 mg/dL — ABNORMAL HIGH (ref 8–23)
CO2: 29 mmol/L (ref 22–32)
Calcium: 9.5 mg/dL (ref 8.9–10.3)
Chloride: 101 mmol/L (ref 98–111)
Creatinine, Ser: 1.15 mg/dL (ref 0.61–1.24)
GFR, Estimated: >60 mL/min (ref >60)
Glucose, Bld: 132 mg/dL — ABNORMAL HIGH (ref 70–99)
Phosphorus: 4.5 mg/dL (ref 2.5–4.6)
Potassium: 4 mmol/L (ref 3.5–5.1)
Sodium: 142 mmol/L (ref 135–145)

## 2021-02-22 LAB — CBC
HCT: 30 % — ABNORMAL LOW (ref 39.0–52.0)
Hemoglobin: 9.5 g/dL — ABNORMAL LOW (ref 13.0–17.0)
MCH: 30.2 pg (ref 26.0–34.0)
MCHC: 31.7 g/dL (ref 30.0–36.0)
MCV: 95.2 fL (ref 80.0–100.0)
Platelets: 484 10*3/uL — ABNORMAL HIGH (ref 150–400)
RBC: 3.15 MIL/uL — ABNORMAL LOW (ref 4.22–5.81)
RDW: 17.4 % — ABNORMAL HIGH (ref 11.5–15.5)
WBC: 12.4 10*3/uL — ABNORMAL HIGH (ref 4.0–10.5)
nRBC: 0 % (ref 0.0–0.2)

## 2021-02-22 LAB — MAGNESIUM: Magnesium: 2.3 mg/dL (ref 1.7–2.4)

## 2021-02-22 NOTE — Progress Notes (Signed)
Pulmonary Critical Care Medicine [facility] [place]   PULMONARY CRITICAL CARE SERVICE  PROGRESS NOTE  Date of Service: 02/22/2021  Scott Roberts  [id number]:[id number]  DOB: 08/13/1956   DOA: 12/27/2020  Referring Physician: [name], MD  HPI: Scott Roberts is a 65 y.o. adult seen for follow up of Acute on Chronic Respiratory Failure.  Patient is on the ventilator and full support has been working with physical therapy gradually  Medications: Reviewed on Rounds  Physical Exam:  Vitals: Temperature is 96.7 pulse 81 respiratory 24 blood pressure is 160/63 saturations 96%  Ventilator Settings on assist control FiO2 28% tidal volume 500 with a PEEP of 7  . General: Comfortable at this time . Eyes: Grossly normal lids, irises & conjunctiva . ENT: grossly tongue is normal . Neck: no obvious mass . Cardiovascular: S1 S2 normal no gallop . Respiratory: No rhonchi very coarse breath sounds . Abdomen: soft . Skin: no rash seen on limited exam . Musculoskeletal: not rigid . Psychiatric:unable to assess . Neurologic: no seizure no involuntary movements         Lab Data:   Basic Metabolic Panel: Recent Labs  Lab 02/18/21 0500 02/19/21 0418 02/20/21 0446 02/21/21 0503 02/22/21 0544  NA  --  146* 143 143 142  K  --  3.5 3.4* 5.0 4.0  CL  --  103 100 104 101  CO2  --  32 32 26 29  GLUCOSE  --  113* 110* 102* 132*  BUN 41* 41* 43* 39* 42*  CREATININE 1.18 1.21 1.23 1.15 1.15  CALCIUM  --  9.3 9.3 9.1 9.5  MG  --   --  2.3 2.4 2.3  PHOS  --   --  3.6 4.0 4.5    ABG: Recent Labs  Lab 02/20/21 0940  PHART 7.461*  PCO2ART 47.5  PO2ART 67.8*  HCO3 33.4*  O2SAT 93.0    Liver Function Tests: Recent Labs  Lab 02/20/21 0446 02/22/21 0544  ALBUMIN 2.3* 2.6*   No results for input(s): LIPASE, AMYLASE in the last 168 hours. No results for input(s): AMMONIA in the last 168 hours.  CBC: Recent Labs  Lab 02/18/21 0500 02/20/21 0446 02/22/21 0544  WBC 11.2*  11.1* 12.4*  HGB 8.9* 8.5* 9.5*  HCT 29.0* 28.2* 30.0*  MCV 96.3 96.9 95.2  PLT 400 410* 484*    Cardiac Enzymes: No results for input(s): CKTOTAL, CKMB, CKMBINDEX, TROPONINI in the last 168 hours.  BNP (last 3 results) Recent Labs    02/08/21 2018  BNP 179.5*    ProBNP (last 3 results) No results for input(s): PROBNP in the last 8760 hours.  Radiological Exams: DG CHEST PORT 1 VIEW  Result Date: 02/22/2021 CLINICAL DATA:  Pneumonia. EXAM: PORTABLE CHEST 1 VIEW COMPARISON:  February 18, 2021. FINDINGS: Stable cardiomediastinal silhouette. Tracheostomy tube is unchanged in position. No pneumothorax is noted. Stable bilateral lung opacities are noted concerning for pneumonia or edema with associated pleural effusions. Bony thorax is unremarkable. IMPRESSION: Stable bilateral lung opacities as described above. Electronically Signed   By: Lupita Raider M.D.   On: 02/22/2021 09:06    Assessment/Plan Active Problems:   Acute on chronic respiratory failure with hypoxia (HCC)   COVID-19 virus infection   Acute respiratory distress syndrome (ARDS) due to COVID-19 virus [facility])   Healthcare associated bacterial pneumonia   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 Acute on chronic respiratory failure with hypoxia we will continue  with assist control on 28% FiO2 titrate as tolerated continue pulmonary toilet 2. COVID-19 virus infection recovery we will continue to follow 3. ARDS treated slow improvement 4. Healthcare associated pneumonia no change 5. Chronic DVT continue with supportive care   I have personally seen and evaluated the patient, evaluated laboratory and imaging results, formulated the assessment and plan and placed orders. The Patient requires high complexity decision making with multiple systems involvement.  Rounds were done with the Respiratory Therapy Director and Staff therapists and discussed with nursing staff also.  Yevonne Pax, MD  [facility] Pulmonary Critical Care Medicine Sleep Medicine

## 2021-02-23 DIAGNOSIS — J9621 Acute and chronic respiratory failure with hypoxia: Secondary | ICD-10-CM | POA: Diagnosis not present

## 2021-02-23 DIAGNOSIS — U071 COVID-19: Secondary | ICD-10-CM | POA: Diagnosis not present

## 2021-02-23 DIAGNOSIS — J159 Unspecified bacterial pneumonia: Secondary | ICD-10-CM | POA: Diagnosis not present

## 2021-02-23 DIAGNOSIS — I87099 Postthrombotic syndrome with other complications of unspecified lower extremity: Secondary | ICD-10-CM | POA: Diagnosis not present

## 2021-02-23 NOTE — Progress Notes (Signed)
Pulmonary Critical Care Medicine [facility] [place]   PULMONARY CRITICAL CARE SERVICE  PROGRESS NOTE  Date of Service: 02/23/2021  ISSAIH KAUS  [id number]  DOB: 06/18/1956   DOA: 12/27/2020  Referring Physician: [name], MD  HPI: LIBORIO SACCENTE is a 65 y.o. adult seen for follow up of Acute on Chronic Respiratory Failure.  Patient is afebrile right now comfortable without distress has been on full support on the ventilator not tolerating weaning  Medications: Reviewed on Rounds  Physical Exam:  Vitals: Temperature is 98.0 pulse 80 respiratory rate is 23 blood pressure is 97/76 saturations 99%  Ventilator Settings on assist control FiO2 is 28% tidal volume 500 PEEP of 7  . General: Comfortable at this time . Eyes: Grossly normal lids, irises & conjunctiva . ENT: grossly tongue is normal . Neck: no obvious mass . Cardiovascular: S1 S2 normal no gallop . Respiratory: Scattered rhonchi expansion is equal at this time. . Abdomen: soft . Skin: no rash seen on limited exam . Musculoskeletal: not rigid . Psychiatric:unable to assess . Neurologic: no seizure no involuntary movements         Lab Data:   Basic Metabolic Panel: Recent Labs  Lab 02/18/21 0500 02/19/21 0418 02/20/21 0446 02/21/21 0503 02/22/21 0544  NA  --  146* 143 143 142  K  --  3.5 3.4* 5.0 4.0  CL  --  103 100 104 101  CO2  --  32 32 26 29  GLUCOSE  --  113* 110* 102* 132*  BUN 41* 41* 43* 39* 42*  CREATININE 1.18 1.21 1.23 1.15 1.15  CALCIUM  --  9.3 9.3 9.1 9.5  MG  --   --  2.3 2.4 2.3  PHOS  --   --  3.6 4.0 4.5    ABG: Recent Labs  Lab 02/20/21 0940  PHART 7.461*  PCO2ART 47.5  PO2ART 67.8*  HCO3 33.4*  O2SAT 93.0    Liver Function Tests: Recent Labs  Lab 02/20/21 0446 02/22/21 0544  ALBUMIN 2.3* 2.6*   No results for input(s): LIPASE, AMYLASE in the last 168 hours. No results for input(s): AMMONIA in the last 168 hours.  CBC: Recent Labs  Lab  02/18/21 0500 02/20/21 0446 02/22/21 0544  WBC 11.2* 11.1* 12.4*  HGB 8.9* 8.5* 9.5*  HCT 29.0* 28.2* 30.0*  MCV 96.3 96.9 95.2  PLT 400 410* 484*    Cardiac Enzymes: No results for input(s): CKTOTAL, CKMB, CKMBINDEX, TROPONINI in the last 168 hours.  BNP (last 3 results) Recent Labs    02/08/21 2018  BNP 179.5*    ProBNP (last 3 results) No results for input(s): PROBNP in the last 8760 hours.  Radiological Exams: DG CHEST PORT 1 VIEW  Result Date: 02/22/2021 CLINICAL DATA:  Pneumonia. EXAM: PORTABLE CHEST 1 VIEW COMPARISON:  February 18, 2021. FINDINGS: Stable cardiomediastinal silhouette. Tracheostomy tube is unchanged in position. No pneumothorax is noted. Stable bilateral lung opacities are noted concerning for pneumonia or edema with associated pleural effusions. Bony thorax is unremarkable. IMPRESSION: Stable bilateral lung opacities as described above. Electronically Signed   By: Lupita Raider M.D.   On: 02/22/2021 09:06    Assessment/Plan Active Problems:   Acute on chronic respiratory failure with hypoxia (HCC)   COVID-19 virus infection   Acute respiratory distress syndrome (ARDS) due to COVID-19 virus [facility])   Healthcare associated bacterial pneumonia   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 Acute on chronic  respiratory failure hypoxia we will continue with full support on the ventilator.  Patient has been working with physical therapy respiratory therapy will reassess 2. COVID-19 virus infection recovery we will continue to follow 3. ARDS treated slow improvement 4. Healthcare associated pneumonia slowly improving 5. Chronic hypertension patient is at baseline   I have personally seen and evaluated the patient, evaluated laboratory and imaging results, formulated the assessment and plan and placed orders. The Patient requires high complexity decision making with multiple systems involvement.  Rounds were done with the  Respiratory Therapy Director and Staff therapists and discussed with nursing staff also.  Yevonne Pax, MD [facility] Pulmonary Critical Care Medicine Sleep Medicine

## 2021-02-24 DIAGNOSIS — I87099 Postthrombotic syndrome with other complications of unspecified lower extremity: Secondary | ICD-10-CM | POA: Diagnosis not present

## 2021-02-24 DIAGNOSIS — U071 COVID-19: Secondary | ICD-10-CM | POA: Diagnosis not present

## 2021-02-24 DIAGNOSIS — J9621 Acute and chronic respiratory failure with hypoxia: Secondary | ICD-10-CM | POA: Diagnosis not present

## 2021-02-24 DIAGNOSIS — J159 Unspecified bacterial pneumonia: Secondary | ICD-10-CM | POA: Diagnosis not present

## 2021-02-24 NOTE — Progress Notes (Signed)
Pulmonary Critical Care Medicine [facility] [place]   PULMONARY CRITICAL CARE SERVICE  PROGRESS NOTE  Date of Service: 02/24/2021  Scott Roberts  VOZ:[id number]  DOB: 10-10-1956   DOA: 12/27/2020  Referring Physician: [name], MD  HPI: Scott Roberts is a 65 y.o. adult seen for follow up of Acute on Chronic Respiratory Failure.  Currently on pressure support 12/5 the goal is for 4  Medications: Reviewed on Rounds  Physical Exam:  Vitals: Temperature is 97.8 pulse 93 respiratory rate 33 blood pressure is 171/91 saturations 92%  Ventilator Settings on pressure support 12/5  . General: Comfortable at this time . Eyes: Grossly normal lids, irises & conjunctiva . ENT: grossly tongue is normal . Neck: no obvious mass . Cardiovascular: S1 S2 normal no gallop . Respiratory: No rhonchi very coarse breath sounds . Abdomen: soft . Skin: no rash seen on limited exam . Musculoskeletal: not rigid . Psychiatric:unable to assess . Neurologic: no seizure no involuntary movements         Lab Data:   Basic Metabolic Panel: Recent Labs  Lab 02/18/21 0500 02/19/21 0418 02/20/21 0446 02/21/21 0503 02/22/21 0544  NA  --  146* 143 143 142  K  --  3.5 3.4* 5.0 4.0  CL  --  103 100 104 101  CO2  --  32 32 26 29  GLUCOSE  --  113* 110* 102* 132*  BUN 41* 41* 43* 39* 42*  CREATININE 1.18 1.21 1.23 1.15 1.15  CALCIUM  --  9.3 9.3 9.1 9.5  MG  --   --  2.3 2.4 2.3  PHOS  --   --  3.6 4.0 4.5    ABG: Recent Labs  Lab 02/20/21 0940  PHART 7.461*  PCO2ART 47.5  PO2ART 67.8*  HCO3 33.4*  O2SAT 93.0    Liver Function Tests: Recent Labs  Lab 02/20/21 0446 02/22/21 0544  ALBUMIN 2.3* 2.6*   No results for input(s): LIPASE, AMYLASE in the last 168 hours. No results for input(s): AMMONIA in the last 168 hours.  CBC: Recent Labs  Lab 02/18/21 0500 02/20/21 0446 02/22/21 0544  WBC 11.2* 11.1* 12.4*  HGB 8.9* 8.5* 9.5*  HCT 29.0* 28.2* 30.0*  MCV 96.3 96.9  95.2  PLT 400 410* 484*    Cardiac Enzymes: No results for input(s): CKTOTAL, CKMB, CKMBINDEX, TROPONINI in the last 168 hours.  BNP (last 3 results) Recent Labs    02/08/21 2018  BNP 179.5*    ProBNP (last 3 results) No results for input(s): PROBNP in the last 8760 hours.  Radiological Exams: No results found.  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 Acute on chronic respiratory failure hypoxia we will continue with the pressure support wean as tolerated titrate oxygen continue pulmonary toilet. 2. COVID-19 virus infection recovery 3. ARDS treated slowly improving 4. Healthcare associated pneumonia improving 5. 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 The Patient requires high complexity decision making with multiple systems involvement.  Rounds were done with the Respiratory Therapy Director and Staff therapists and discussed with nursing staff also.  Yevonne Pax, MD [facility]  Pulmonary Critical Care Medicine Sleep Medicine

## 2021-02-25 LAB — RENAL FUNCTION PANEL
Albumin: 2.7 g/dL — ABNORMAL LOW (ref 3.5–5.0)
Anion gap: 10 (ref 5–15)
BUN: 46 mg/dL — ABNORMAL HIGH (ref 8–23)
CO2: 29 mmol/L (ref 22–32)
Calcium: 9.6 mg/dL (ref 8.9–10.3)
Chloride: 102 mmol/L (ref 98–111)
Creatinine, Ser: 1.04 mg/dL (ref 0.61–1.24)
GFR, Estimated: >60 mL/min (ref >60)
Glucose, Bld: 115 mg/dL — ABNORMAL HIGH (ref 70–99)
Phosphorus: 4 mg/dL (ref 2.5–4.6)
Potassium: 4 mmol/L (ref 3.5–5.1)
Sodium: 141 mmol/L (ref 135–145)

## 2021-02-25 LAB — CBC
HCT: 28.5 % — ABNORMAL LOW (ref 39.0–52.0)
Hemoglobin: 9.2 g/dL — ABNORMAL LOW (ref 13.0–17.0)
MCH: 30.7 pg (ref 26.0–34.0)
MCHC: 32.3 g/dL (ref 30.0–36.0)
MCV: 95 fL (ref 80.0–100.0)
Platelets: 426 10*3/uL — ABNORMAL HIGH (ref 150–400)
RBC: 3 MIL/uL — ABNORMAL LOW (ref 4.22–5.81)
RDW: 17.3 % — ABNORMAL HIGH (ref 11.5–15.5)
WBC: 13.6 10*3/uL — ABNORMAL HIGH (ref 4.0–10.5)
nRBC: 0 % (ref 0.0–0.2)

## 2021-02-25 LAB — MAGNESIUM: Magnesium: 2.3 mg/dL (ref 1.7–2.4)

## 2021-02-27 DIAGNOSIS — J159 Unspecified bacterial pneumonia: Secondary | ICD-10-CM | POA: Diagnosis not present

## 2021-02-27 DIAGNOSIS — I87099 Postthrombotic syndrome with other complications of unspecified lower extremity: Secondary | ICD-10-CM | POA: Diagnosis not present

## 2021-02-27 DIAGNOSIS — U071 COVID-19: Secondary | ICD-10-CM | POA: Diagnosis not present

## 2021-02-27 DIAGNOSIS — J9621 Acute and chronic respiratory failure with hypoxia: Secondary | ICD-10-CM | POA: Diagnosis not present

## 2021-02-27 NOTE — Progress Notes (Signed)
Pulmonary Critical Care Medicine [facility] [place]   PULMONARY CRITICAL CARE SERVICE  PROGRESS NOTE  Date of Service: 02/27/2021  Scott Roberts  [id number]  DOB: [birth year]   DOA: 12/27/2020  Referring Physician: [name], MD  HPI: Scott Roberts is a 65 y.o. adult seen for follow up of Acute on Chronic Respiratory Failure.  Patient is weaning on protocol today the goal is for T collar 16 hours  Medications: Reviewed on Rounds  Physical Exam:  Vitals: Temperature is 97.1 pulse 90 respiratory 25 blood pressure is 164/93 saturations 95  Ventilator Settings on pressure support FiO2 28% pressure 12/7  . General: Comfortable at this time . Eyes: Grossly normal lids, irises & conjunctiva . ENT: grossly tongue is normal . Neck: no obvious mass . Cardiovascular: S1 S2 normal no gallop . Respiratory: Scattered rhonchi expansion is equal . Abdomen: soft . Skin: no rash seen on limited exam . Musculoskeletal: not rigid . Psychiatric:unable to assess . Neurologic: no seizure no involuntary movements         Lab Data:   Basic Metabolic Panel: Recent Labs  Lab 02/21/21 0503 02/22/21 0544 02/25/21 0343  NA 143 142 141  K 5.0 4.0 4.0  CL 104 101 102  CO2 26 29 29   GLUCOSE 102* 132* 115*  BUN 39* 42* 46*  CREATININE 1.15 1.15 1.04  CALCIUM 9.1 9.5 9.6  MG 2.4 2.3 2.3  PHOS 4.0 4.5 4.0    ABG: Recent Labs  Lab 02/20/21 0940  PHART 7.461*  PCO2ART 47.5  PO2ART 67.8*  HCO3 33.4*  O2SAT 93.0    Liver Function Tests: Recent Labs  Lab 02/22/21 0544 02/25/21 0343  ALBUMIN 2.6* 2.7*   No results for input(s): LIPASE, AMYLASE in the last 168 hours. No results for input(s): AMMONIA in the last 168 hours.  CBC: Recent Labs  Lab 02/22/21 0544 02/25/21 0343  WBC 12.4* 13.6*  HGB 9.5* 9.2*  HCT 30.0* 28.5*  MCV 95.2 95.0  PLT 484* 426*    Cardiac Enzymes: No results for input(s): CKTOTAL, CKMB, CKMBINDEX, TROPONINI in the last 168  hours.  BNP (last 3 results) Recent Labs    02/08/21 2018  BNP 179.5*    ProBNP (last 3 results) No results for input(s): PROBNP in the last 8760 hours.  Radiological Exams: No results found.  Assessment/Plan Active Problems:   Acute on chronic respiratory failure with hypoxia (HCC)   COVID-19 virus infection   Acute respiratory distress syndrome (ARDS) due to COVID-19 virus [facility])   Healthcare associated bacterial pneumonia   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 Acute on chronic respiratory failure hypoxia we will continue with the weaning protocol goal of 16 hours today. 2. COVID-19 virus infection recovery 3. ARDS treated improving 4. Healthcare associated pneumonia treated improving slowly 5. Chronic DVT treated with anticoagulation   I have personally seen and evaluated the patient, evaluated laboratory and imaging results, formulated the assessment and plan and placed orders. The Patient requires high complexity decision making with multiple systems involvement.  Rounds were done with the Respiratory Therapy Director and Staff therapists and discussed with nursing staff also.  [facility], MD [facility] Pulmonary Critical Care Medicine Sleep Medicine

## 2021-02-28 DIAGNOSIS — J9621 Acute and chronic respiratory failure with hypoxia: Secondary | ICD-10-CM | POA: Diagnosis not present

## 2021-02-28 DIAGNOSIS — U071 COVID-19: Secondary | ICD-10-CM | POA: Diagnosis not present

## 2021-02-28 DIAGNOSIS — J159 Unspecified bacterial pneumonia: Secondary | ICD-10-CM | POA: Diagnosis not present

## 2021-02-28 DIAGNOSIS — I87099 Postthrombotic syndrome with other complications of unspecified lower extremity: Secondary | ICD-10-CM | POA: Diagnosis not present

## 2021-02-28 LAB — BASIC METABOLIC PANEL
Anion gap: 11 (ref 5–15)
BUN: 29 mg/dL — ABNORMAL HIGH (ref 8–23)
CO2: 28 mmol/L (ref 22–32)
Calcium: 9.3 mg/dL (ref 8.9–10.3)
Chloride: 100 mmol/L (ref 98–111)
Creatinine, Ser: 0.97 mg/dL (ref 0.61–1.24)
GFR, Estimated: >60 mL/min (ref >60)
Glucose, Bld: 131 mg/dL — ABNORMAL HIGH (ref 70–99)
Potassium: 3.5 mmol/L (ref 3.5–5.1)
Sodium: 139 mmol/L (ref 135–145)

## 2021-02-28 LAB — CBC
HCT: 29.7 % — ABNORMAL LOW (ref 39.0–52.0)
Hemoglobin: 9.6 g/dL — ABNORMAL LOW (ref 13.0–17.0)
MCH: 29.8 pg (ref 26.0–34.0)
MCHC: 32.3 g/dL (ref 30.0–36.0)
MCV: 92.2 fL (ref 80.0–100.0)
Platelets: 416 10*3/uL — ABNORMAL HIGH (ref 150–400)
RBC: 3.22 MIL/uL — ABNORMAL LOW (ref 4.22–5.81)
RDW: 17.1 % — ABNORMAL HIGH (ref 11.5–15.5)
WBC: 14.2 10*3/uL — ABNORMAL HIGH (ref 4.0–10.5)
nRBC: 0 % (ref 0.0–0.2)

## 2021-02-28 LAB — MAGNESIUM: Magnesium: 2.1 mg/dL (ref 1.7–2.4)

## 2021-02-28 NOTE — Progress Notes (Signed)
Pulmonary Critical Care Medicine [facility] [place]   PULMONARY CRITICAL CARE SERVICE  PROGRESS NOTE  Date of Service: 02/28/2021  Scott Roberts  [id number]  DOB: 05/11/1956   DOA: 12/27/2020  Referring Physician: [name], MD  HPI: Scott Roberts is a 65 y.o. adult seen for follow up of Acute on Chronic Respiratory Failure.  Patient currently is on T collar did 16 hours yesterday  Medications: Reviewed on Rounds  Physical Exam:  Vitals: Temperature 97.8 pulse 80 respiratory 25 blood pressures 134/88 saturations 97%  Ventilator Settings of the ventilator rate T collar  . General: Comfortable at this time . Eyes: Grossly normal lids, irises & conjunctiva . ENT: grossly tongue is normal . Neck: no obvious mass . Cardiovascular: S1 S2 normal no gallop . Respiratory: Scattered rhonchi expansion of the . Abdomen: soft . Skin: no rash seen on limited exam . Musculoskeletal: not rigid . Psychiatric:unable to assess . Neurologic: no seizure no involuntary movements         Lab Data:   Basic Metabolic Panel: Recent Labs  Lab 02/22/21 0544 02/25/21 0343 02/28/21 0642  NA 142 141 139  K 4.0 4.0 3.5  CL 101 102 100  CO2 29 29 28   GLUCOSE 132* 115* 131*  BUN 42* 46* 29*  CREATININE 1.15 1.04 0.97  CALCIUM 9.5 9.6 9.3  MG 2.3 2.3 2.1  PHOS 4.5 4.0  --     ABG: No results for input(s): PHART, PCO2ART, PO2ART, HCO3, O2SAT in the last 168 hours.  Liver Function Tests: Recent Labs  Lab 02/22/21 0544 02/25/21 0343  ALBUMIN 2.6* 2.7*   No results for input(s): LIPASE, AMYLASE in the last 168 hours. No results for input(s): AMMONIA in the last 168 hours.  CBC: Recent Labs  Lab 02/22/21 0544 02/25/21 0343 02/28/21 0642  WBC 12.4* 13.6* 14.2*  HGB 9.5* 9.2* 9.6*  HCT 30.0* 28.5* 29.7*  MCV 95.2 95.0 92.2  PLT 484* 426* 416*    Cardiac Enzymes: No results for input(s): CKTOTAL, CKMB, CKMBINDEX, TROPONINI in the last 168 hours.  BNP (last  3 results) Recent Labs    02/08/21 2018  BNP 179.5*    ProBNP (last 3 results) No results for input(s): PROBNP in the last 8760 hours.  Radiological Exams: No results found.  Assessment/Plan Active Problems:   Acute on chronic respiratory failure with hypoxia (HCC)   COVID-19 virus infection   Acute respiratory distress syndrome (ARDS) due to COVID-19 virus [facility])   Healthcare associated bacterial pneumonia   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 Acute on chronic respiratory failure hypoxia plan is to continue with weaning patient's goal is for 20 hours we will continue to advance as tolerated. 2. COVID-19 virus infection recovery 3. ARDS treated slowly improving 4. Healthcare associated pneumonia patient is at baseline 5. Chronic DVT no change   I have personally seen and evaluated the patient, evaluated laboratory and imaging results, formulated the assessment and plan and placed orders. The Patient requires high complexity decision making with multiple systems involvement.  Rounds were done with the Respiratory Therapy Director and Staff therapists and discussed with nursing staff also.  [facility], MD [facility] Pulmonary Critical Care Medicine Sleep Medicine

## 2021-03-01 DIAGNOSIS — J9621 Acute and chronic respiratory failure with hypoxia: Secondary | ICD-10-CM | POA: Diagnosis not present

## 2021-03-01 DIAGNOSIS — I87099 Postthrombotic syndrome with other complications of unspecified lower extremity: Secondary | ICD-10-CM | POA: Diagnosis not present

## 2021-03-01 DIAGNOSIS — U071 COVID-19: Secondary | ICD-10-CM | POA: Diagnosis not present

## 2021-03-01 DIAGNOSIS — J159 Unspecified bacterial pneumonia: Secondary | ICD-10-CM | POA: Diagnosis not present

## 2021-03-01 NOTE — Progress Notes (Signed)
Pulmonary Critical Care Medicine [facility] [place]   PULMONARY CRITICAL CARE SERVICE  PROGRESS NOTE  Date of Service: 03/01/2021  Scott Roberts  [id number]  DOB: 08/31/1956   DOA: 12/27/2020  Referring Physician: [name], MD  HPI: Scott Roberts is a 65 y.o. adult seen for follow up of Acute on Chronic Respiratory Failure.  Patient is on T collar currently on 28% FiO2  Medications: Reviewed on Rounds  Physical Exam:  Vitals: Temperature is 98 pulse 93 respiratory rate 20 blood pressure is 171/91 saturations 94%  Ventilator Settings off the ventilator on T collar  . General: Comfortable at this time . Eyes: Grossly normal lids, irises & conjunctiva . ENT: grossly tongue is normal . Neck: no obvious mass . Cardiovascular: S1 S2 normal no gallop . Respiratory: No rhonchi no rales noted . Abdomen: soft . Skin: no rash seen on limited exam . Musculoskeletal: not rigid . Psychiatric:unable to assess . Neurologic: no seizure no involuntary movements         Lab Data:   Basic Metabolic Panel: Recent Labs  Lab 02/25/21 0343 02/28/21 0642  NA 141 139  K 4.0 3.5  CL 102 100  CO2 29 28  GLUCOSE 115* 131*  BUN 46* 29*  CREATININE 1.04 0.97  CALCIUM 9.6 9.3  MG 2.3 2.1  PHOS 4.0  --     ABG: No results for input(s): PHART, PCO2ART, PO2ART, HCO3, O2SAT in the last 168 hours.  Liver Function Tests: Recent Labs  Lab 02/25/21 0343  ALBUMIN 2.7*   No results for input(s): LIPASE, AMYLASE in the last 168 hours. No results for input(s): AMMONIA in the last 168 hours.  CBC: Recent Labs  Lab 02/25/21 0343 02/28/21 0642  WBC 13.6* 14.2*  HGB 9.2* 9.6*  HCT 28.5* 29.7*  MCV 95.0 92.2  PLT 426* 416*    Cardiac Enzymes: No results for input(s): CKTOTAL, CKMB, CKMBINDEX, TROPONINI in the last 168 hours.  BNP (last 3 results) Recent Labs    02/08/21 2018  BNP 179.5*    ProBNP (last 3 results) No results for input(s): PROBNP in the last  8760 hours.  Radiological Exams: No results found.  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 Acute on chronic respiratory failure hypoxia we will continue with T collar wean today's goal is for 48 hours. 2. COVID-19 virus infection recovery we will continue with supportive care 3. ARDS treated improving 4. Healthcare associated pneumonia improving 5. Chronic DVT on anticoagulation   I have personally seen and evaluated the patient, evaluated laboratory and imaging results, formulated the assessment and plan and placed orders. The Patient requires high complexity decision making with multiple systems involvement.  Rounds were done with the Respiratory Therapy Director and Staff therapists and discussed with nursing staff also.  Yevonne Pax, MD [facility] Pulmonary Critical Care Medicine Sleep Medicine

## 2021-03-02 ENCOUNTER — Other Ambulatory Visit (HOSPITAL_COMMUNITY): Payer: Federal, State, Local not specified - PPO

## 2021-03-02 DIAGNOSIS — J159 Unspecified bacterial pneumonia: Secondary | ICD-10-CM | POA: Diagnosis not present

## 2021-03-02 DIAGNOSIS — U071 COVID-19: Secondary | ICD-10-CM | POA: Diagnosis not present

## 2021-03-02 DIAGNOSIS — J9621 Acute and chronic respiratory failure with hypoxia: Secondary | ICD-10-CM | POA: Diagnosis not present

## 2021-03-02 DIAGNOSIS — I87099 Postthrombotic syndrome with other complications of unspecified lower extremity: Secondary | ICD-10-CM | POA: Diagnosis not present

## 2021-03-02 NOTE — Progress Notes (Signed)
Pulmonary Critical Care Medicine [facility] [place]   PULMONARY CRITICAL CARE SERVICE  PROGRESS NOTE  Date of Service: 03/02/2021  Scott Roberts  [id number]:[id number]  DOB: [birth year]   DOA: 12/27/2020  Referring Physician: [name], MD  HPI: Scott Roberts is a 65 y.o. adult seen for follow up of Acute on Chronic Respiratory Failure.  Patient apparently was put back on the ventilator last night unknown as to exact reason however now back is the T collar peers to be tolerating it well.  Medications: Reviewed on Rounds  Physical Exam:  Vitals: Temperature is 98.0 pulse 84 respiratory 25 blood pressure is 156/89 saturations 93%  Ventilator Settings on T collar FiO2 40%  . General: Comfortable at this time . Eyes: Grossly normal lids, irises & conjunctiva . ENT: grossly tongue is normal . Neck: no obvious mass . Cardiovascular: S1 S2 normal no gallop . Respiratory: Scattered rhonchi expansion is equal . Abdomen: soft . Skin: no rash seen on limited exam . Musculoskeletal: not rigid . Psychiatric:unable to assess . Neurologic: no seizure no involuntary movements         Lab Data:   Basic Metabolic Panel: Recent Labs  Lab 02/25/21 0343 02/28/21 0642  NA 141 139  K 4.0 3.5  CL 102 100  CO2 29 28  GLUCOSE 115* 131*  BUN 46* 29*  CREATININE 1.04 0.97  CALCIUM 9.6 9.3  MG 2.3 2.1  PHOS 4.0  --     ABG: No results for input(s): PHART, PCO2ART, PO2ART, HCO3, O2SAT in the last 168 hours.  Liver Function Tests: Recent Labs  Lab 02/25/21 0343  ALBUMIN 2.7*   No results for input(s): LIPASE, AMYLASE in the last 168 hours. No results for input(s): AMMONIA in the last 168 hours.  CBC: Recent Labs  Lab 02/25/21 0343 02/28/21 0642  WBC 13.6* 14.2*  HGB 9.2* 9.6*  HCT 28.5* 29.7*  MCV 95.0 92.2  PLT 426* 416*    Cardiac Enzymes: No results for input(s): CKTOTAL, CKMB, CKMBINDEX, TROPONINI in the last 168 hours.  BNP (last 3 results) Recent Labs     02/08/21 2018  BNP 179.5*    ProBNP (last 3 results) No results for input(s): PROBNP in the last 8760 hours.  Radiological Exams: DG CHEST PORT 1 VIEW  Result Date: 03/02/2021 CLINICAL DATA:  Respiratory distress EXAM: PORTABLE CHEST 1 VIEW COMPARISON:  February 22, 2021 FINDINGS: Tracheostomy catheter tip is 5.4 cm above the carina. No pneumothorax. There is stable cardiomegaly with a degree of pulmonary venous hypertension. There are pleural effusions bilaterally with ill-defined airspace opacity in portions of each mid and lower lung region. No adenopathy evident. Old healed fracture right clavicle with remodeling noted. IMPRESSION: Tracheostomy as described without pneumothorax. Cardiomegaly with pulmonary vascular congestion. Pleural effusions bilaterally. Suspect a degree of underlying congestive heart failure. Airspace opacity in each mid and lower lung region may represent edema, pneumonia, or a combination of both entities. Electronically Signed   By: Bretta Bang III M.D.   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 Acute on chronic respiratory failure hypoxia we will continue with weaning on T collar.  Chest x-ray was done showed possibly some underlying CHF may benefit  from diuresis 2. COVID-19 virus infection recovery 3. ARDS treated 4. Healthcare associated pneumonia treated 5. 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 The Patient requires high complexity decision making with multiple systems involvement.  Rounds were done with the  Respiratory Therapy Director and Staff therapists and discussed with nursing staff also.  Yevonne Pax, MD [facility] Pulmonary Critical Care Medicine Sleep Medicine

## 2021-03-03 DIAGNOSIS — U071 COVID-19: Secondary | ICD-10-CM | POA: Diagnosis not present

## 2021-03-03 DIAGNOSIS — I87099 Postthrombotic syndrome with other complications of unspecified lower extremity: Secondary | ICD-10-CM | POA: Diagnosis not present

## 2021-03-03 DIAGNOSIS — J159 Unspecified bacterial pneumonia: Secondary | ICD-10-CM | POA: Diagnosis not present

## 2021-03-03 DIAGNOSIS — J9621 Acute and chronic respiratory failure with hypoxia: Secondary | ICD-10-CM | POA: Diagnosis not present

## 2021-03-03 LAB — CBC
HCT: 28.6 % — ABNORMAL LOW (ref 39.0–52.0)
Hemoglobin: 9 g/dL — ABNORMAL LOW (ref 13.0–17.0)
MCH: 29.5 pg (ref 26.0–34.0)
MCHC: 31.5 g/dL (ref 30.0–36.0)
MCV: 93.8 fL (ref 80.0–100.0)
Platelets: 335 10*3/uL (ref 150–400)
RBC: 3.05 MIL/uL — ABNORMAL LOW (ref 4.22–5.81)
RDW: 17.1 % — ABNORMAL HIGH (ref 11.5–15.5)
WBC: 17 10*3/uL — ABNORMAL HIGH (ref 4.0–10.5)
nRBC: 0 % (ref 0.0–0.2)

## 2021-03-03 LAB — BASIC METABOLIC PANEL
Anion gap: 6 (ref 5–15)
BUN: 45 mg/dL — ABNORMAL HIGH (ref 8–23)
CO2: 32 mmol/L (ref 22–32)
Calcium: 9.2 mg/dL (ref 8.9–10.3)
Chloride: 99 mmol/L (ref 98–111)
Creatinine, Ser: 0.95 mg/dL (ref 0.61–1.24)
GFR, Estimated: >60 mL/min (ref >60)
Glucose, Bld: 116 mg/dL — ABNORMAL HIGH (ref 70–99)
Potassium: 3.3 mmol/L — ABNORMAL LOW (ref 3.5–5.1)
Sodium: 137 mmol/L (ref 135–145)

## 2021-03-03 LAB — MAGNESIUM: Magnesium: 2.2 mg/dL (ref 1.7–2.4)

## 2021-03-03 NOTE — Progress Notes (Signed)
Pulmonary Critical Care Medicine [facility] [place]   PULMONARY CRITICAL CARE SERVICE  PROGRESS NOTE  Date of Service: 03/03/2021  Scott Roberts  YKD:[id number]  DOB: 1956/10/15   DOA: 12/27/2020  Referring Physician: [name], MD  HPI: Scott Roberts is a 65 y.o. adult seen for follow up of Acute on Chronic Respiratory Failure.  Patient was attempted on T collar failed again felt to be due to increased anxiety chest x-ray also showing fluid overload right now is on assist control mode on 28% FiO2  Medications: Reviewed on Rounds  Physical Exam:  Vitals: Temperature is 97.9 pulse 86 respiratory 27 blood pressure is 133/77 saturations 95  Ventilator Settings on assist control FiO2 is 28% PEEP 7 tidal volume is 500  . General: Comfortable at this time . Eyes: Grossly normal lids, irises & conjunctiva . ENT: grossly tongue is normal . Neck: no obvious mass . Cardiovascular: S1 S2 normal no gallop . Respiratory: No rhonchi very coarse breath sounds . Abdomen: soft . Skin: no rash seen on limited exam . Musculoskeletal: not rigid . Psychiatric:unable to assess . Neurologic: no seizure no involuntary movements         Lab Data:   Basic Metabolic Panel: Recent Labs  Lab 02/25/21 0343 02/28/21 0642 03/03/21 0334  NA 141 139 137  K 4.0 3.5 3.3*  CL 102 100 99  CO2 29 28 32  GLUCOSE 115* 131* 116*  BUN 46* 29* 45*  CREATININE 1.04 0.97 0.95  CALCIUM 9.6 9.3 9.2  MG 2.3 2.1 2.2  PHOS 4.0  --   --     ABG: No results for input(s): PHART, PCO2ART, PO2ART, HCO3, O2SAT in the last 168 hours.  Liver Function Tests: Recent Labs  Lab 02/25/21 0343  ALBUMIN 2.7*   No results for input(s): LIPASE, AMYLASE in the last 168 hours. No results for input(s): AMMONIA in the last 168 hours.  CBC: Recent Labs  Lab 02/25/21 0343 02/28/21 0642 03/03/21 0334  WBC 13.6* 14.2* 17.0*  HGB 9.2* 9.6* 9.0*  HCT 28.5* 29.7* 28.6*  MCV 95.0 92.2 93.8  PLT 426* 416*  335    Cardiac Enzymes: No results for input(s): CKTOTAL, CKMB, CKMBINDEX, TROPONINI in the last 168 hours.  BNP (last 3 results) Recent Labs    02/08/21 2018  BNP 179.5*    ProBNP (last 3 results) No results for input(s): PROBNP in the last 8760 hours.  Radiological Exams: DG CHEST PORT 1 VIEW  Result Date: 03/02/2021 CLINICAL DATA:  Respiratory distress EXAM: PORTABLE CHEST 1 VIEW COMPARISON:  February 22, 2021 FINDINGS: Tracheostomy catheter tip is 5.4 cm above the carina. No pneumothorax. There is stable cardiomegaly with a degree of pulmonary venous hypertension. There are pleural effusions bilaterally with ill-defined airspace opacity in portions of each mid and lower lung region. No adenopathy evident. Old healed fracture right clavicle with remodeling noted. IMPRESSION: Tracheostomy as described without pneumothorax. Cardiomegaly with pulmonary vascular congestion. Pleural effusions bilaterally. Suspect a degree of underlying congestive heart failure. Airspace opacity in each mid and lower lung region may represent edema, pneumonia, or a combination of both entities. Electronically Signed   By: Bretta Bang III M.D.   On: 03/02/2021 08:48    Assessment/Plan Active Problems:   Acute on chronic respiratory failure with hypoxia (HCC)   COVID-19 virus infection   Acute respiratory distress syndrome (ARDS) due to COVID-19 virus [facility])   Healthcare associated bacterial pneumonia   Chronic venous hypertension due to  DVT, unspecified laterality   1. Acute on chronic respiratory failure with hypoxia plan is to continue with the full support on the ventilator increase diuretics.  Also noted with increased white count so empiric antibiotics will be considered 2. COVID-19 virus infection recovery 3. ARDS treated with slow improvement 4. Healthcare associated pneumonia rising white count possible reinfection 5. Chronic DVT treated   I have personally seen and evaluated the patient,  evaluated laboratory and imaging results, formulated the assessment and plan and placed orders. The Patient requires high complexity decision making with multiple systems involvement.  Rounds were done with the Respiratory Therapy Director and Staff therapists and discussed with nursing staff also.  Yevonne Pax, MD [facility] Pulmonary Critical Care Medicine Sleep Medicine

## 2021-03-04 LAB — CBC
HCT: 30.9 % — ABNORMAL LOW (ref 39.0–52.0)
Hemoglobin: 10 g/dL — ABNORMAL LOW (ref 13.0–17.0)
MCH: 30 pg (ref 26.0–34.0)
MCHC: 32.4 g/dL (ref 30.0–36.0)
MCV: 92.8 fL (ref 80.0–100.0)
Platelets: 355 10*3/uL (ref 150–400)
RBC: 3.33 MIL/uL — ABNORMAL LOW (ref 4.22–5.81)
RDW: 16.9 % — ABNORMAL HIGH (ref 11.5–15.5)
WBC: 16.2 10*3/uL — ABNORMAL HIGH (ref 4.0–10.5)
nRBC: 0 % (ref 0.0–0.2)

## 2021-03-04 LAB — BUN: BUN: 43 mg/dL — ABNORMAL HIGH (ref 8–23)

## 2021-03-04 LAB — CREATININE, SERUM
Creatinine, Ser: 0.91 mg/dL (ref 0.61–1.24)
GFR, Estimated: >60 mL/min (ref >60)

## 2021-03-04 LAB — URINALYSIS, ROUTINE W REFLEX MICROSCOPIC
Bilirubin Urine: NEGATIVE
Glucose, UA: NEGATIVE mg/dL
Hgb urine dipstick: NEGATIVE
Ketones, ur: NEGATIVE mg/dL
Leukocytes,Ua: NEGATIVE
Nitrite: NEGATIVE
Protein, ur: NEGATIVE mg/dL
Specific Gravity, Urine: 1.011 (ref 1.005–1.030)
pH: 8 (ref 5.0–8.0)

## 2021-03-05 ENCOUNTER — Other Ambulatory Visit (HOSPITAL_COMMUNITY): Payer: Federal, State, Local not specified - PPO

## 2021-03-05 DIAGNOSIS — I87099 Postthrombotic syndrome with other complications of unspecified lower extremity: Secondary | ICD-10-CM | POA: Diagnosis not present

## 2021-03-05 DIAGNOSIS — J159 Unspecified bacterial pneumonia: Secondary | ICD-10-CM | POA: Diagnosis not present

## 2021-03-05 DIAGNOSIS — U071 COVID-19: Secondary | ICD-10-CM | POA: Diagnosis not present

## 2021-03-05 DIAGNOSIS — J9621 Acute and chronic respiratory failure with hypoxia: Secondary | ICD-10-CM | POA: Diagnosis not present

## 2021-03-05 LAB — CULTURE, RESPIRATORY W GRAM STAIN

## 2021-03-05 LAB — MAGNESIUM: Magnesium: 2.3 mg/dL (ref 1.7–2.4)

## 2021-03-05 LAB — BASIC METABOLIC PANEL WITH GFR
Anion gap: 9 (ref 5–15)
BUN: 44 mg/dL — ABNORMAL HIGH (ref 8–23)
CO2: 31 mmol/L (ref 22–32)
Calcium: 9.5 mg/dL (ref 8.9–10.3)
Chloride: 101 mmol/L (ref 98–111)
Creatinine, Ser: 0.91 mg/dL (ref 0.61–1.24)
GFR, Estimated: >60 mL/min
Glucose, Bld: 138 mg/dL — ABNORMAL HIGH (ref 70–99)
Potassium: 3.5 mmol/L (ref 3.5–5.1)
Sodium: 141 mmol/L (ref 135–145)

## 2021-03-05 LAB — URINE CULTURE

## 2021-03-05 LAB — URINALYSIS, ROUTINE W REFLEX MICROSCOPIC
Bilirubin Urine: NEGATIVE
Glucose, UA: NEGATIVE mg/dL
Hgb urine dipstick: NEGATIVE
Ketones, ur: NEGATIVE mg/dL
Leukocytes,Ua: NEGATIVE
Nitrite: NEGATIVE
Protein, ur: NEGATIVE mg/dL
Specific Gravity, Urine: 1.006 (ref 1.005–1.030)
pH: 7 (ref 5.0–8.0)

## 2021-03-05 NOTE — Progress Notes (Signed)
Pulmonary Critical Care Medicine [facility] [place]   PULMONARY CRITICAL CARE SERVICE  PROGRESS NOTE  Date of Service: 03/05/2021  Scott Roberts  [id number]  DOB: 1956-09-07   DOA: 12/27/2020  Referring Physician: [name], MD  HPI: Scott Roberts is a 65 y.o. adult seen for follow up of Acute on Chronic Respiratory Failure.  Patient is comfortable right now without distress at this time no fevers noted.  Medications: Reviewed on Rounds  Physical Exam:  Vitals: Temperature is 97.2 pulse 86 respiratory 23 blood pressure is 150/86 saturations 95%  Ventilator Settings off the ventilator on T collar  . General: Comfortable at this time . Eyes: Grossly normal lids, irises & conjunctiva . ENT: grossly tongue is normal . Neck: no obvious mass . Cardiovascular: S1 S2 normal no gallop . Respiratory: No rhonchi very coarse breath sounds . Abdomen: soft . Skin: no rash seen on limited exam . Musculoskeletal: not rigid . Psychiatric:unable to assess . Neurologic: no seizure no involuntary movements         Lab Data:   Basic Metabolic Panel: Recent Labs  Lab 02/28/21 0642 03/03/21 0334 03/04/21 0549 03/05/21 0434  NA 139 137  --  141  K 3.5 3.3*  --  3.5  CL 100 99  --  101  CO2 28 32  --  31  GLUCOSE 131* 116*  --  138*  BUN 29* 45* 43* 44*  CREATININE 0.97 0.95 0.91 0.91  CALCIUM 9.3 9.2  --  9.5  MG 2.1 2.2  --  2.3    ABG: No results for input(s): PHART, PCO2ART, PO2ART, HCO3, O2SAT in the last 168 hours.  Liver Function Tests: No results for input(s): AST, ALT, ALKPHOS, BILITOT, PROT, ALBUMIN in the last 168 hours. No results for input(s): LIPASE, AMYLASE in the last 168 hours. No results for input(s): AMMONIA in the last 168 hours.  CBC: Recent Labs  Lab 02/28/21 0642 03/03/21 0334 03/04/21 0549  WBC 14.2* 17.0* 16.2*  HGB 9.6* 9.0* 10.0*  HCT 29.7* 28.6* 30.9*  MCV 92.2 93.8 92.8  PLT 416* 335 355    Cardiac Enzymes: No results  for input(s): CKTOTAL, CKMB, CKMBINDEX, TROPONINI in the last 168 hours.  BNP (last 3 results) Recent Labs    02/08/21 2018  BNP 179.5*    ProBNP (last 3 results) No results for input(s): PROBNP in the last 8760 hours.  Radiological Exams: DG Chest Port 1 View  Result Date: 03/05/2021 CLINICAL DATA:  65 year old male with respiratory distress. Respiratory failure following COVID-19. Suspected pulmonary edema. EXAM: PORTABLE CHEST 1 VIEW COMPARISON:  Portable chest 03/02/2021 and earlier. FINDINGS: Portable AP semi upright view at 0542 hours. Stable tracheostomy. Mildly larger lung volumes. Stable cardiac size and mediastinal contours. Continued dense retrocardiac opacity, diffuse pulmonary interstitial opacity and veiling opacity in both lower lungs greater on the right. No pneumothorax. No air bronchograms. Stable visualized osseous structures. Right clavicle deformity. IMPRESSION: Ongoing interstitial opacity favored due to pulmonary edema, with suspected lower lobe collapse and right greater than left pleural effusions. Ventilation minimally improved since 02/22/2021. Pulmonary fibrosis or recurrent viral/atypical respiratory infection are felt less likely. Electronically Signed   By: Odessa Fleming M.D.   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 Acute on chronic respiratory failure hypoxia we will continue with the T collar wean the goal of 20 hours 2. COVID-19 virus infection recovery phase we will continue to monitor 3. ARDS treated slow improvement 4. Healthcare associated pneumonia treated slowly improving 5. Chronic DVT supportive care   I have personally seen and evaluated the patient, evaluated  laboratory and imaging results, formulated the assessment and plan and placed orders. The Patient requires high complexity decision making with multiple systems involvement.  Rounds were done with the Respiratory Therapy Director and Staff therapists and discussed with nursing staff also.  Yevonne Pax, MD [facility] Pulmonary Critical Care Medicine Sleep Medicine

## 2021-03-06 DIAGNOSIS — U071 COVID-19: Secondary | ICD-10-CM | POA: Diagnosis not present

## 2021-03-06 DIAGNOSIS — J9621 Acute and chronic respiratory failure with hypoxia: Secondary | ICD-10-CM | POA: Diagnosis not present

## 2021-03-06 DIAGNOSIS — I87099 Postthrombotic syndrome with other complications of unspecified lower extremity: Secondary | ICD-10-CM | POA: Diagnosis not present

## 2021-03-06 DIAGNOSIS — J159 Unspecified bacterial pneumonia: Secondary | ICD-10-CM | POA: Diagnosis not present

## 2021-03-06 LAB — CBC
HCT: 33.8 % — ABNORMAL LOW (ref 39.0–52.0)
Hemoglobin: 10.4 g/dL — ABNORMAL LOW (ref 13.0–17.0)
MCH: 29.4 pg (ref 26.0–34.0)
MCHC: 30.8 g/dL (ref 30.0–36.0)
MCV: 95.5 fL (ref 80.0–100.0)
Platelets: 406 10*3/uL — ABNORMAL HIGH (ref 150–400)
RBC: 3.54 MIL/uL — ABNORMAL LOW (ref 4.22–5.81)
RDW: 16.9 % — ABNORMAL HIGH (ref 11.5–15.5)
WBC: 12.1 10*3/uL — ABNORMAL HIGH (ref 4.0–10.5)
nRBC: 0 % (ref 0.0–0.2)

## 2021-03-06 LAB — RENAL FUNCTION PANEL
Albumin: 2.7 g/dL — ABNORMAL LOW (ref 3.5–5.0)
Anion gap: 9 (ref 5–15)
BUN: 45 mg/dL — ABNORMAL HIGH (ref 8–23)
CO2: 33 mmol/L — ABNORMAL HIGH (ref 22–32)
Calcium: 9.7 mg/dL (ref 8.9–10.3)
Chloride: 101 mmol/L (ref 98–111)
Creatinine, Ser: 0.89 mg/dL (ref 0.61–1.24)
GFR, Estimated: >60 mL/min (ref >60)
Glucose, Bld: 140 mg/dL — ABNORMAL HIGH (ref 70–99)
Phosphorus: 3.2 mg/dL (ref 2.5–4.6)
Potassium: 3.1 mmol/L — ABNORMAL LOW (ref 3.5–5.1)
Sodium: 143 mmol/L (ref 135–145)

## 2021-03-06 LAB — URINE CULTURE: Culture: NO GROWTH

## 2021-03-06 NOTE — Progress Notes (Signed)
Pulmonary Critical Care Medicine [facility] [place]   PULMONARY CRITICAL CARE SERVICE  PROGRESS NOTE  Date of Service: 03/06/2021  Scott Roberts  [id number]:[id number]  DOB: 1956-04-30   DOA: 12/27/2020  Referring Physician: [name], MD  HPI: Scott Roberts is a 65 y.o. adult seen for follow up of Acute on Chronic Respiratory Failure.  Patient remains off the ventilator has been on T collar currently on 35% FiO2 good saturations are noted.  Medications: Reviewed on Rounds  Physical Exam:  Vitals: Temperature is 98.1 pulse 104 respiratory 30 blood pressure is 166/85 saturations 96%  Ventilator Settings on T collar FiO2 35%  . General: Comfortable at this time . Eyes: Grossly normal lids, irises & conjunctiva . ENT: grossly tongue is normal . Neck: no obvious mass . Cardiovascular: S1 S2 normal no gallop . Respiratory: Scattered rhonchi expansion is equal at this time . Abdomen: soft . Skin: no rash seen on limited exam . Musculoskeletal: not rigid . Psychiatric:unable to assess . Neurologic: no seizure no involuntary movements         Lab Data:   Basic Metabolic Panel: Recent Labs  Lab 02/28/21 0642 03/03/21 0334 03/04/21 0549 03/05/21 0434 03/06/21 0421  NA 139 137  --  141 143  K 3.5 3.3*  --  3.5 3.1*  CL 100 99  --  101 101  CO2 28 32  --  31 33*  GLUCOSE 131* 116*  --  138* 140*  BUN 29* 45* 43* 44* 45*  CREATININE 0.97 0.95 0.91 0.91 0.89  CALCIUM 9.3 9.2  --  9.5 9.7  MG 2.1 2.2  --  2.3  --   PHOS  --   --   --   --  3.2    ABG: No results for input(s): PHART, PCO2ART, PO2ART, HCO3, O2SAT in the last 168 hours.  Liver Function Tests: Recent Labs  Lab 03/06/21 0421  ALBUMIN 2.7*   No results for input(s): LIPASE, AMYLASE in the last 168 hours. No results for input(s): AMMONIA in the last 168 hours.  CBC: Recent Labs  Lab 02/28/21 0642 03/03/21 0334 03/04/21 0549 03/06/21 0421  WBC 14.2* 17.0* 16.2* 12.1*  HGB 9.6* 9.0* 10.0*  10.4*  HCT 29.7* 28.6* 30.9* 33.8*  MCV 92.2 93.8 92.8 95.5  PLT 416* 335 355 406*    Cardiac Enzymes: No results for input(s): CKTOTAL, CKMB, CKMBINDEX, TROPONINI in the last 168 hours.  BNP (last 3 results) Recent Labs    02/08/21 2018  BNP 179.5*    ProBNP (last 3 results) No results for input(s): PROBNP in the last 8760 hours.  Radiological Exams: DG Chest Port 1 View  Result Date: 03/05/2021 CLINICAL DATA:  65 year old male with respiratory distress. Respiratory failure following COVID-19. Suspected pulmonary edema. EXAM: PORTABLE CHEST 1 VIEW COMPARISON:  Portable chest 03/02/2021 and earlier. FINDINGS: Portable AP semi upright view at 0542 hours. Stable tracheostomy. Mildly larger lung volumes. Stable cardiac size and mediastinal contours. Continued dense retrocardiac opacity, diffuse pulmonary interstitial opacity and veiling opacity in both lower lungs greater on the right. No pneumothorax. No air bronchograms. Stable visualized osseous structures. Right clavicle deformity. IMPRESSION: Ongoing interstitial opacity favored due to pulmonary edema, with suspected lower lobe collapse and right greater than left pleural effusions. Ventilation minimally improved since 02/22/2021. Pulmonary fibrosis or recurrent viral/atypical respiratory infection are felt less likely. Electronically Signed   By: Odessa Fleming M.D.   On: 03/05/2021 06:07    Assessment/Plan Active Problems:  Acute on chronic respiratory failure with hypoxia (HCC)   COVID-19 virus infection   Acute respiratory distress syndrome (ARDS) due to COVID-19 virus [facility])   Healthcare associated bacterial pneumonia   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 Acute on chronic respiratory failure hypoxia plan is to continue with the T-piece titrate oxygen continue pulmonary toilet. 2. COVID-19 virus infection recovery 3. ARDS treated slowly improving 4. Healthcare associated pneumonia treated slow  improvement 5. 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 The Patient requires high complexity decision making with multiple systems involvement.  Rounds were done with the Respiratory Therapy Director and Staff therapists and discussed with nursing staff also.  Yevonne Pax, MD [facility] Pulmonary Critical Care Medicine Sleep Medicine

## 2021-03-07 DIAGNOSIS — I87099 Postthrombotic syndrome with other complications of unspecified lower extremity: Secondary | ICD-10-CM | POA: Diagnosis not present

## 2021-03-07 DIAGNOSIS — J9621 Acute and chronic respiratory failure with hypoxia: Secondary | ICD-10-CM | POA: Diagnosis not present

## 2021-03-07 DIAGNOSIS — J159 Unspecified bacterial pneumonia: Secondary | ICD-10-CM | POA: Diagnosis not present

## 2021-03-07 DIAGNOSIS — U071 COVID-19: Secondary | ICD-10-CM | POA: Diagnosis not present

## 2021-03-07 LAB — RENAL FUNCTION PANEL
Albumin: 2.5 g/dL — ABNORMAL LOW (ref 3.5–5.0)
Anion gap: 7 (ref 5–15)
BUN: 46 mg/dL — ABNORMAL HIGH (ref 8–23)
CO2: 32 mmol/L (ref 22–32)
Calcium: 9.3 mg/dL (ref 8.9–10.3)
Chloride: 105 mmol/L (ref 98–111)
Creatinine, Ser: 0.88 mg/dL (ref 0.61–1.24)
GFR, Estimated: >60 mL/min (ref >60)
Glucose, Bld: 147 mg/dL — ABNORMAL HIGH (ref 70–99)
Phosphorus: 3.1 mg/dL (ref 2.5–4.6)
Potassium: 3.4 mmol/L — ABNORMAL LOW (ref 3.5–5.1)
Sodium: 144 mmol/L (ref 135–145)

## 2021-03-07 LAB — BUN: BUN: 45 mg/dL — ABNORMAL HIGH (ref 8–23)

## 2021-03-07 LAB — MAGNESIUM: Magnesium: 2.1 mg/dL (ref 1.7–2.4)

## 2021-03-07 NOTE — Progress Notes (Signed)
Pulmonary Critical Care Medicine [facility] [place]   PULMONARY CRITICAL CARE SERVICE  PROGRESS NOTE  Date of Service: 03/07/2021  [name]  YSA:[id number]  DOB: January 12, 1956   DOA: 12/27/2020  Referring Physician: [name], MD  HPI: Scott Roberts is a 65 y.o. adult seen for follow up of Acute on Chronic Respiratory Failure.  The patient is currently on T collar requiring 40% FiO2 Scott be completing 48-hour  Medications: Reviewed on Rounds  Physical Exam:  Vitals: Temperature is 97.2 pulse 69 respiratory rate 33 blood pressure is 141/75 saturations 94%  Ventilator Settings off the ventilator on T collar currently on 40% FiO2  . General: Comfortable at this time . Eyes: Grossly normal lids, irises & conjunctiva . ENT: grossly tongue is normal . Neck: no obvious mass . Cardiovascular: S1 S2 normal no gallop . Respiratory: No rhonchi very coarse breath sounds are noted . Abdomen: soft . Skin: no rash seen on limited exam . Musculoskeletal: not rigid . Psychiatric:unable to assess . Neurologic: no seizure no involuntary movements         Lab Data:   Basic Metabolic Panel: Recent Labs  Lab 03/03/21 0334 03/04/21 0549 03/05/21 0434 03/06/21 0421 03/07/21 0515  NA 137  --  141 143 144  K 3.3*  --  3.5 3.1* 3.4*  CL 99  --  101 101 105  CO2 32  --  31 33* 32  GLUCOSE 116*  --  138* 140* 147*  BUN 45* 43* 44* 45* 45*  46*  CREATININE 0.95 0.91 0.91 0.89 0.88  CALCIUM 9.2  --  9.5 9.7 9.3  MG 2.2  --  2.3  --  2.1  PHOS  --   --   --  3.2 3.1    ABG: No results for input(s): PHART, PCO2ART, PO2ART, HCO3, O2SAT in the last 168 hours.  Liver Function Tests: Recent Labs  Lab 03/06/21 0421 03/07/21 0515  ALBUMIN 2.7* 2.5*   No results for input(s): LIPASE, AMYLASE in the last 168 hours. No results for input(s): AMMONIA in the last 168 hours.  CBC: Recent Labs  Lab 03/03/21 0334 03/04/21 0549 03/06/21 0421  WBC 17.0* 16.2* 12.1*  HGB  9.0* 10.0* 10.4*  HCT 28.6* 30.9* 33.8*  MCV 93.8 92.8 95.5  PLT 335 355 406*    Cardiac Enzymes: No results for input(s): CKTOTAL, CKMB, CKMBINDEX, TROPONINI in the last 168 hours.  BNP (last 3 results) Recent Labs    02/08/21 2018  BNP 179.5*    ProBNP (last 3 results) No results for input(s): PROBNP in the last 8760 hours.  Radiological Exams: No results found.  Assessment/Plan Active Problems:   Acute on chronic respiratory failure with hypoxia (HCC)   COVID-19 virus infection   Acute respiratory distress syndrome (ARDS) due to COVID-19 virus [facility])   Healthcare associated bacterial pneumonia   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 Acute on chronic respiratory failure hypoxia plan is to continue with T collar patient is on 40% FiO2 2. Patient is completing 48 hours. 3. COVID-19 virus infection recovery phase we Scott continue to follow along. 4. ARDS supportive care prognosis guarded 5. Healthcare associated pneumonia treated slow improvement 6. 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 The Patient requires high complexity decision making with multiple systems involvement.  Rounds were done  with the Respiratory Therapy Director and Staff therapists and discussed with nursing staff also.  Allyne Gee, MD [facility] Pulmonary Critical Care Medicine Sleep Medicine

## 2021-03-08 DIAGNOSIS — J159 Unspecified bacterial pneumonia: Secondary | ICD-10-CM | POA: Diagnosis not present

## 2021-03-08 DIAGNOSIS — I87099 Postthrombotic syndrome with other complications of unspecified lower extremity: Secondary | ICD-10-CM | POA: Diagnosis not present

## 2021-03-08 DIAGNOSIS — J9621 Acute and chronic respiratory failure with hypoxia: Secondary | ICD-10-CM | POA: Diagnosis not present

## 2021-03-08 DIAGNOSIS — U071 COVID-19: Secondary | ICD-10-CM | POA: Diagnosis not present

## 2021-03-08 LAB — BASIC METABOLIC PANEL
Anion gap: 7 (ref 5–15)
BUN: 44 mg/dL — ABNORMAL HIGH (ref 8–23)
CO2: 36 mmol/L — ABNORMAL HIGH (ref 22–32)
Calcium: 9.6 mg/dL (ref 8.9–10.3)
Chloride: 104 mmol/L (ref 98–111)
Creatinine, Ser: 0.94 mg/dL (ref 0.61–1.24)
GFR, Estimated: >60 mL/min (ref >60)
Glucose, Bld: 140 mg/dL — ABNORMAL HIGH (ref 70–99)
Potassium: 3.6 mmol/L (ref 3.5–5.1)
Sodium: 147 mmol/L — ABNORMAL HIGH (ref 135–145)

## 2021-03-08 LAB — CBC
HCT: 33.9 % — ABNORMAL LOW (ref 39.0–52.0)
Hemoglobin: 10.4 g/dL — ABNORMAL LOW (ref 13.0–17.0)
MCH: 29.5 pg (ref 26.0–34.0)
MCHC: 30.7 g/dL (ref 30.0–36.0)
MCV: 96.3 fL (ref 80.0–100.0)
Platelets: 412 10*3/uL — ABNORMAL HIGH (ref 150–400)
RBC: 3.52 MIL/uL — ABNORMAL LOW (ref 4.22–5.81)
RDW: 17.1 % — ABNORMAL HIGH (ref 11.5–15.5)
WBC: 9.7 10*3/uL (ref 4.0–10.5)
nRBC: 0 % (ref 0.0–0.2)

## 2021-03-08 LAB — MAGNESIUM: Magnesium: 2.1 mg/dL (ref 1.7–2.4)

## 2021-03-08 LAB — PHOSPHORUS: Phosphorus: 3.4 mg/dL (ref 2.5–4.6)

## 2021-03-08 NOTE — Progress Notes (Signed)
Pulmonary Critical Care Medicine [facility] [place]   PULMONARY CRITICAL CARE SERVICE  PROGRESS NOTE  Date of Service: 03/08/2021  Scott Roberts  [id number]:[id number]  DOB: 09-19-1956   DOA: 12/27/2020  Referring Physician: [name], MD  HPI: Scott Roberts is a 65 y.o. adult seen for follow up of Acute on Chronic Respiratory Failure.  Patient is off the ventilator at this time is been on T collar currently is on 50% FiO2 good saturations are noted  Medications: Reviewed on Rounds  Physical Exam:  Vitals: Temperature is 97.0 pulse 104 respiratory rate is 30 blood pressure is 143/76 saturations 94%  Ventilator Settings on T collar with an FiO2 50%  . General: Comfortable at this time . Eyes: Grossly normal lids, irises & conjunctiva . ENT: grossly tongue is normal . Neck: no obvious mass . Cardiovascular: S1 S2 normal no gallop . Respiratory: Scattered rhonchi expansion is equal . Abdomen: soft . Skin: no rash seen on limited exam . Musculoskeletal: not rigid . Psychiatric:unable to assess . Neurologic: no seizure no involuntary movements         Lab Data:   Basic Metabolic Panel: Recent Labs  Lab 03/03/21 0334 03/04/21 0549 03/05/21 0434 03/06/21 0421 03/07/21 0515 03/08/21 0424  NA 137  --  141 143 144 147*  K 3.3*  --  3.5 3.1* 3.4* 3.6  CL 99  --  101 101 105 104  CO2 32  --  31 33* 32 36*  GLUCOSE 116*  --  138* 140* 147* 140*  BUN 45* 43* 44* 45* 45*  46* 44*  CREATININE 0.95 0.91 0.91 0.89 0.88 0.94  CALCIUM 9.2  --  9.5 9.7 9.3 9.6  MG 2.2  --  2.3  --  2.1 2.1  PHOS  --   --   --  3.2 3.1 3.4    ABG: No results for input(s): PHART, PCO2ART, PO2ART, HCO3, O2SAT in the last 168 hours.  Liver Function Tests: Recent Labs  Lab 03/06/21 0421 03/07/21 0515  ALBUMIN 2.7* 2.5*   No results for input(s): LIPASE, AMYLASE in the last 168 hours. No results for input(s): AMMONIA in the last 168 hours.  CBC: Recent Labs  Lab 03/03/21 0334  03/04/21 0549 03/06/21 0421 03/08/21 0424  WBC 17.0* 16.2* 12.1* 9.7  HGB 9.0* 10.0* 10.4* 10.4*  HCT 28.6* 30.9* 33.8* 33.9*  MCV 93.8 92.8 95.5 96.3  PLT 335 355 406* 412*    Cardiac Enzymes: No results for input(s): CKTOTAL, CKMB, CKMBINDEX, TROPONINI in the last 168 hours.  BNP (last 3 results) Recent Labs    02/08/21 2018  BNP 179.5*    ProBNP (last 3 results) No results for input(s): PROBNP in the last 8760 hours.  Radiological Exams: No results found.  Assessment/Plan Active Problems:   Acute on chronic respiratory failure with hypoxia (HCC)   COVID-19 virus infection   Acute respiratory distress syndrome (ARDS) due to COVID-19 virus [facility])   Healthcare associated bacterial pneumonia   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 Acute on chronic respiratory failure hypoxia we will continue with T-piece titrate oxygen as tolerated completing 48 hours 2. COVID-19 virus infection recovery 3. ARDS slow improvement 4. Healthcare associated pneumonia treated slowly improving 5. 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 The Patient requires high complexity decision making with  multiple systems involvement.  Rounds were done with the Respiratory Therapy Director and Staff therapists and discussed with nursing staff also.  Allyne Gee, MD [facility] Pulmonary Critical Care Medicine Sleep Medicine

## 2021-03-09 ENCOUNTER — Other Ambulatory Visit (HOSPITAL_COMMUNITY): Payer: Federal, State, Local not specified - PPO

## 2021-03-09 DIAGNOSIS — J9621 Acute and chronic respiratory failure with hypoxia: Secondary | ICD-10-CM | POA: Diagnosis not present

## 2021-03-09 DIAGNOSIS — I87099 Postthrombotic syndrome with other complications of unspecified lower extremity: Secondary | ICD-10-CM | POA: Diagnosis not present

## 2021-03-09 DIAGNOSIS — J159 Unspecified bacterial pneumonia: Secondary | ICD-10-CM | POA: Diagnosis not present

## 2021-03-09 DIAGNOSIS — U071 COVID-19: Secondary | ICD-10-CM | POA: Diagnosis not present

## 2021-03-09 LAB — RENAL FUNCTION PANEL
Albumin: 2.6 g/dL — ABNORMAL LOW (ref 3.5–5.0)
Anion gap: 9 (ref 5–15)
BUN: 43 mg/dL — ABNORMAL HIGH (ref 8–23)
CO2: 32 mmol/L (ref 22–32)
Calcium: 9.4 mg/dL (ref 8.9–10.3)
Chloride: 104 mmol/L (ref 98–111)
Creatinine, Ser: 0.86 mg/dL (ref 0.61–1.24)
GFR, Estimated: >60 mL/min (ref >60)
Glucose, Bld: 146 mg/dL — ABNORMAL HIGH (ref 70–99)
Phosphorus: 3.3 mg/dL (ref 2.5–4.6)
Potassium: 3.8 mmol/L (ref 3.5–5.1)
Sodium: 145 mmol/L (ref 135–145)

## 2021-03-09 LAB — BLOOD GAS, ARTERIAL
Acid-Base Excess: 9.2 mmol/L — ABNORMAL HIGH (ref 0.0–2.0)
Bicarbonate: 34.6 mmol/L — ABNORMAL HIGH (ref 20.0–28.0)
FIO2: 50
O2 Saturation: 94 %
Patient temperature: 36.9
pCO2 arterial: 60.8 mmHg — ABNORMAL HIGH (ref 32.0–48.0)
pH, Arterial: 7.373 (ref 7.350–7.450)
pO2, Arterial: 71.6 mmHg — ABNORMAL LOW (ref 83.0–108.0)

## 2021-03-09 LAB — MAGNESIUM: Magnesium: 2.2 mg/dL (ref 1.7–2.4)

## 2021-03-09 NOTE — Progress Notes (Signed)
Pulmonary Critical Care Medicine [facility] [place]   PULMONARY CRITICAL CARE SERVICE  PROGRESS NOTE  Date of Service: 03/09/2021  Scott Roberts  [id number]:[id number]  DOB: September 04, 1956   DOA: 12/27/2020  Referring Physician: [name], MD  HPI: Scott Roberts is a 65 y.o. adult seen for follow up of Acute on Chronic Respiratory Failure.  Patient is off the ventilator right now on T collar has been on 50% FiO2.  Medications: Reviewed on Rounds  Physical Exam:  Vitals: Temperature is 97.1 pulse 105 respiratory rate is 28 blood pressure is 162/91 saturations 96%  Ventilator Settings off the ventilator on T collar secretions are open reportedly "  . General: Comfortable at this time . Eyes: Grossly normal lids, irises & conjunctiva . ENT: grossly tongue is normal . Neck: no obvious mass . Cardiovascular: S1 S2 normal no gallop . Respiratory: Scattered rhonchi expansion is equal . Abdomen: soft . Skin: no rash seen on limited exam . Musculoskeletal: not rigid . Psychiatric:unable to assess . Neurologic: no seizure no involuntary movements         Lab Data:   Basic Metabolic Panel: Recent Labs  Lab 03/03/21 0334 03/04/21 0549 03/05/21 0434 03/06/21 0421 03/07/21 0515 03/08/21 0424 03/09/21 0446  NA 137  --  141 143 144 147* 145  K 3.3*  --  3.5 3.1* 3.4* 3.6 3.8  CL 99  --  101 101 105 104 104  CO2 32  --  31 33* 32 36* 32  GLUCOSE 116*  --  138* 140* 147* 140* 146*  BUN 45*   < > 44* 45* 45*  46* 44* 43*  CREATININE 0.95   < > 0.91 0.89 0.88 0.94 0.86  CALCIUM 9.2  --  9.5 9.7 9.3 9.6 9.4  MG 2.2  --  2.3  --  2.1 2.1 2.2  PHOS  --   --   --  3.2 3.1 3.4 3.3   < > = values in this interval not displayed.    ABG: No results for input(s): PHART, PCO2ART, PO2ART, HCO3, O2SAT in the last 168 hours.  Liver Function Tests: Recent Labs  Lab 03/06/21 0421 03/07/21 0515 03/09/21 0446  ALBUMIN 2.7* 2.5* 2.6*   No results for input(s): LIPASE, AMYLASE in  the last 168 hours. No results for input(s): AMMONIA in the last 168 hours.  CBC: Recent Labs  Lab 03/03/21 0334 03/04/21 0549 03/06/21 0421 03/08/21 0424  WBC 17.0* 16.2* 12.1* 9.7  HGB 9.0* 10.0* 10.4* 10.4*  HCT 28.6* 30.9* 33.8* 33.9*  MCV 93.8 92.8 95.5 96.3  PLT 335 355 406* 412*    Cardiac Enzymes: No results for input(s): CKTOTAL, CKMB, CKMBINDEX, TROPONINI in the last 168 hours.  BNP (last 3 results) Recent Labs    02/08/21 2018  BNP 179.5*    ProBNP (last 3 results) No results for input(s): PROBNP in the last 8760 hours.  Radiological Exams: DG Chest Port 1 View  Result Date: 03/09/2021 CLINICAL DATA:  65 year old male with history of COVID-19. Respiratory failure. EXAM: PORTABLE CHEST 1 VIEW COMPARISON:  Portable chest 03/05/2021 and earlier. FINDINGS: Portable AP semi upright view at 0517 hours. Stable tracheostomy and mediastinal contours. Ongoing right greater than left veiling and indistinct pulmonary interstitial opacity. These are progressed since 03/02/2021, mildly progressed also since 03/05/2021. Evidence of dense retrocardiac collapse or consolidation. No pneumothorax. Paucity of bowel gas in the upper abdomen. Stable visualized osseous structures. IMPRESSION: Continued worsening of bilateral interstitial and veiling  lung opacity since 03/05/2021. As before this could be pulmonary edema, with bilateral pleural effusions and superimposed lower lobe collapse or consolidation. But differential diagnosis include progressive ARDS, viral pneumonia. Electronically Signed   By: Odessa Fleming M.D.   On: 03/09/2021 06:54    Assessment/Plan Active Problems:   Acute on chronic respiratory failure with hypoxia (HCC)   COVID-19 virus infection   Acute respiratory distress syndrome (ARDS) due to COVID-19 virus [facility])   Healthcare associated bacterial pneumonia   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 Acute on chronic respiratory failure hypoxia  we will continue with the T-piece.  Right now secretions remain an issue.  Continue with pulmonary toilet supportive care. 2. COVID-19 virus infection in recovery we will continue to follow 3. ARDS slow improvement continue present management 4. Healthcare associated pneumonia has been treated with antibiotics. 5. 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 The Patient requires high complexity decision making with multiple systems involvement.  Rounds were done with the Respiratory Therapy Director and Staff therapists and discussed with nursing staff also.  Yevonne Pax, MD [facility] Pulmonary Critical Care Medicine Sleep Medicine

## 2021-03-10 DIAGNOSIS — U071 COVID-19: Secondary | ICD-10-CM | POA: Diagnosis not present

## 2021-03-10 DIAGNOSIS — I87099 Postthrombotic syndrome with other complications of unspecified lower extremity: Secondary | ICD-10-CM | POA: Diagnosis not present

## 2021-03-10 DIAGNOSIS — J159 Unspecified bacterial pneumonia: Secondary | ICD-10-CM | POA: Diagnosis not present

## 2021-03-10 DIAGNOSIS — J9621 Acute and chronic respiratory failure with hypoxia: Secondary | ICD-10-CM | POA: Diagnosis not present

## 2021-03-10 LAB — CBC
HCT: 34.9 % — ABNORMAL LOW (ref 39.0–52.0)
Hemoglobin: 10.7 g/dL — ABNORMAL LOW (ref 13.0–17.0)
MCH: 29.7 pg (ref 26.0–34.0)
MCHC: 30.7 g/dL (ref 30.0–36.0)
MCV: 96.9 fL (ref 80.0–100.0)
Platelets: 384 10*3/uL (ref 150–400)
RBC: 3.6 MIL/uL — ABNORMAL LOW (ref 4.22–5.81)
RDW: 16.8 % — ABNORMAL HIGH (ref 11.5–15.5)
WBC: 13.2 10*3/uL — ABNORMAL HIGH (ref 4.0–10.5)
nRBC: 0 % (ref 0.0–0.2)

## 2021-03-10 LAB — RENAL FUNCTION PANEL
Albumin: 2.6 g/dL — ABNORMAL LOW (ref 3.5–5.0)
Anion gap: 7 (ref 5–15)
BUN: 42 mg/dL — ABNORMAL HIGH (ref 8–23)
CO2: 35 mmol/L — ABNORMAL HIGH (ref 22–32)
Calcium: 9.4 mg/dL (ref 8.9–10.3)
Chloride: 103 mmol/L (ref 98–111)
Creatinine, Ser: 0.88 mg/dL (ref 0.61–1.24)
GFR, Estimated: >60 mL/min (ref >60)
Glucose, Bld: 146 mg/dL — ABNORMAL HIGH (ref 70–99)
Phosphorus: 3.2 mg/dL (ref 2.5–4.6)
Potassium: 3.6 mmol/L (ref 3.5–5.1)
Sodium: 145 mmol/L (ref 135–145)

## 2021-03-10 LAB — MAGNESIUM: Magnesium: 2.3 mg/dL (ref 1.7–2.4)

## 2021-03-10 NOTE — Progress Notes (Signed)
Pulmonary Critical Care Medicine [facility] [place]   PULMONARY CRITICAL CARE SERVICE  PROGRESS NOTE  Date of Service: 03/10/2021  Scott Roberts  BZJ:[id number]  DOB: 01-Apr-1956   DOA: 12/27/2020  Referring Physician: [name], MD  HPI: Scott Roberts is a 65 y.o. adult seen for follow up of Acute on Chronic Respiratory Failure.  Patient is afebrile right now resting comfortably has been on T collar on 50% FiO2 using Mucomyst because of increased secretions  Medications: Reviewed on Rounds  Physical Exam:  Vitals: Temperature is 97.7 pulse 87 respiratory 29 blood pressure is 168/93 saturations 95%  Ventilator Settings on T collar FiO2 50%  . General: Comfortable at this time . Eyes: Grossly normal lids, irises & conjunctiva . ENT: grossly tongue is normal . Neck: no obvious mass . Cardiovascular: S1 S2 normal no gallop . Respiratory: Coarse breath sounds with few scattered rhonchi . Abdomen: soft . Skin: no rash seen on limited exam . Musculoskeletal: not rigid . Psychiatric:unable to assess . Neurologic: no seizure no involuntary movements         Lab Data:   Basic Metabolic Panel: Recent Labs  Lab 03/05/21 0434 03/06/21 0421 03/07/21 0515 03/08/21 0424 03/09/21 0446 03/10/21 0316  NA 141 143 144 147* 145 145  K 3.5 3.1* 3.4* 3.6 3.8 3.6  CL 101 101 105 104 104 103  CO2 31 33* 32 36* 32 35*  GLUCOSE 138* 140* 147* 140* 146* 146*  BUN 44* 45* 45*  46* 44* 43* 42*  CREATININE 0.91 0.89 0.88 0.94 0.86 0.88  CALCIUM 9.5 9.7 9.3 9.6 9.4 9.4  MG 2.3  --  2.1 2.1 2.2 2.3  PHOS  --  3.2 3.1 3.4 3.3 3.2    ABG: Recent Labs  Lab 03/09/21 0800  PHART 7.373  PCO2ART 60.8*  PO2ART 71.6*  HCO3 34.6*  O2SAT 94.0    Liver Function Tests: Recent Labs  Lab 03/06/21 0421 03/07/21 0515 03/09/21 0446 03/10/21 0316  ALBUMIN 2.7* 2.5* 2.6* 2.6*   No results for input(s): LIPASE, AMYLASE in the last 168 hours. No results for input(s): AMMONIA in  the last 168 hours.  CBC: Recent Labs  Lab 03/04/21 0549 03/06/21 0421 03/08/21 0424 03/10/21 0316  WBC 16.2* 12.1* 9.7 13.2*  HGB 10.0* 10.4* 10.4* 10.7*  HCT 30.9* 33.8* 33.9* 34.9*  MCV 92.8 95.5 96.3 96.9  PLT 355 406* 412* 384    Cardiac Enzymes: No results for input(s): CKTOTAL, CKMB, CKMBINDEX, TROPONINI in the last 168 hours.  BNP (last 3 results) Recent Labs    02/08/21 2018  BNP 179.5*    ProBNP (last 3 results) No results for input(s): PROBNP in the last 8760 hours.  Radiological Exams: DG Chest Port 1 View  Result Date: 03/09/2021 CLINICAL DATA:  65 year old male with history of COVID-19. Respiratory failure. EXAM: PORTABLE CHEST 1 VIEW COMPARISON:  Portable chest 03/05/2021 and earlier. FINDINGS: Portable AP semi upright view at 0517 hours. Stable tracheostomy and mediastinal contours. Ongoing right greater than left veiling and indistinct pulmonary interstitial opacity. These are progressed since 03/02/2021, mildly progressed also since 03/05/2021. Evidence of dense retrocardiac collapse or consolidation. No pneumothorax. Paucity of bowel gas in the upper abdomen. Stable visualized osseous structures. IMPRESSION: Continued worsening of bilateral interstitial and veiling lung opacity since 03/05/2021. As before this could be pulmonary edema, with bilateral pleural effusions and superimposed lower lobe collapse or consolidation. But differential diagnosis include progressive ARDS, viral pneumonia. Electronically Signed   By:  Odessa Fleming M.D.   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 Acute on chronic respiratory failure hypoxia we will continue with T-piece continue aggressive  pulmonary toilet. 2. COVID-19 virus infection recovery phase 3. ARDS at baseline we will continue to follow 4. Healthcare associated pneumonia treated 5. 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 The Patient requires high complexity decision making with multiple systems involvement.  Rounds were done with the Respiratory Therapy Director and Staff therapists and discussed with nursing staff also.  Yevonne Pax, MD [facility] Pulmonary Critical Care Medicine Sleep Medicine

## 2021-03-11 DIAGNOSIS — U071 COVID-19: Secondary | ICD-10-CM | POA: Diagnosis not present

## 2021-03-11 DIAGNOSIS — J9621 Acute and chronic respiratory failure with hypoxia: Secondary | ICD-10-CM | POA: Diagnosis not present

## 2021-03-11 DIAGNOSIS — J159 Unspecified bacterial pneumonia: Secondary | ICD-10-CM | POA: Diagnosis not present

## 2021-03-11 DIAGNOSIS — I87099 Postthrombotic syndrome with other complications of unspecified lower extremity: Secondary | ICD-10-CM | POA: Diagnosis not present

## 2021-03-11 LAB — BASIC METABOLIC PANEL
Anion gap: 8 (ref 5–15)
BUN: 36 mg/dL — ABNORMAL HIGH (ref 8–23)
CO2: 35 mmol/L — ABNORMAL HIGH (ref 22–32)
Calcium: 9.8 mg/dL (ref 8.9–10.3)
Chloride: 105 mmol/L (ref 98–111)
Creatinine, Ser: 0.73 mg/dL (ref 0.61–1.24)
GFR, Estimated: >60 mL/min (ref >60)
Glucose, Bld: 144 mg/dL — ABNORMAL HIGH (ref 70–99)
Potassium: 3.4 mmol/L — ABNORMAL LOW (ref 3.5–5.1)
Sodium: 148 mmol/L — ABNORMAL HIGH (ref 135–145)

## 2021-03-11 LAB — CBC
HCT: 40.2 % (ref 39.0–52.0)
Hemoglobin: 12.4 g/dL — ABNORMAL LOW (ref 13.0–17.0)
MCH: 29.6 pg (ref 26.0–34.0)
MCHC: 30.8 g/dL (ref 30.0–36.0)
MCV: 95.9 fL (ref 80.0–100.0)
Platelets: 442 10*3/uL — ABNORMAL HIGH (ref 150–400)
RBC: 4.19 MIL/uL — ABNORMAL LOW (ref 4.22–5.81)
RDW: 16.5 % — ABNORMAL HIGH (ref 11.5–15.5)
WBC: 16.9 10*3/uL — ABNORMAL HIGH (ref 4.0–10.5)
nRBC: 0 % (ref 0.0–0.2)

## 2021-03-11 LAB — MAGNESIUM: Magnesium: 2.3 mg/dL (ref 1.7–2.4)

## 2021-03-11 LAB — PHOSPHORUS: Phosphorus: 2.6 mg/dL (ref 2.5–4.6)

## 2021-03-11 NOTE — Progress Notes (Signed)
PROGRESS NOTE    Scott Roberts  HBZ:[id number] DOB: 06-20-1956 DOA: 12/27/2020  Brief Narrative:  Scott Roberts is an 65 y.o. male who presented to the emergency department with worsening shortness of breath and respiratory distress along with hypoxemia.  He was diagnosed with acute respiratory failure secondary to COVID-19 infection with pneumonia. He presented to [facility] on 11/21/2020. He did not qualify for remdesivir secondary to high oxygen requirements.  He was treated with steroids and was also given interleukin-6 inhibitor on 11/21/2020.  Unfortunately he had progressively worsening respiratory failure and had to be intubated on 11/21/2020.  He was extubated on 12/01/2020.  However, his respiratory status worsened and he had to be reintubated on 12/02/2020.  He was unable to be weaned from the vent therefore received tracheostomy on 12/06/2020.  His hospital course complicated by bilateral lower extremity DVTs, small pulmonary embolism thought to be associated with COVID-19 infection.  His hospital course also complicated by acute kidney injury requiring hemodialysis.  Eventually he had some renal recovery and dialysis was discontinued.  He also had healthcare associated pneumonia.  His sputum cultures from 12/12/2020 showed Pseudomonas aeruginosa.  He was initially treated with IV vancomycin, meropenem and later changed to IV cefepime.  He had thrombocytopenia, exact etiology was unclear but there was concern for possible beta-lactam induced thrombocytopenia therefore switched to ciprofloxacin.  He completed treatment with this.  However, he continued to have copious secretions.  Respiratory culture from 12/26/2020 showed moderate growth gram-negative rods and aztreonam was started.  He also had anemia thought to be secondary to inflammation and he received 1 unit PRBC on 12/14/2020 and 1 unit PRBC on 12/24/2020.  Patient also had encephalopathy thought to be secondary to  COVID-19 infection.  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 His respiratory cultures from here showed Pseudomonas aeruginosa for which he was treated with meropenem.  After completing antibiotics he started having fever again, increasing secretions.  Therefore treated with ceftazidime.  However, he is continuing to have increased secretions with worsening leukocytosis therefore he was treated with meropenem again. Now having increased secretions with low-grade fevers.  Assessment & Plan:  Active Problems:   Acute on chronic hypoxemic respiratory failure Pneumonia with Pseudomonas Leukocytosis COVID-19 infection Encephalopathy Bilateral lower extremity DVT Atrial fibrillation Acute kidney injury Protein-calorie malnutrition Dysphagia  Acute hypoxemic respiratory failure: He initially had COVID-19 infection with pneumonia.  Subsequent bacterial pneumonia.  His respiratory cultures at the outside facility showed Pseudomonas aeruginosa.  He has received treatment with multiple antibiotics including IV vancomycin, meropenem, cefepime, ciprofloxacin at the acute facility.  After that he was subsequently switched to Azactam. However, had worsening leukocytosis worsening respiratory status. Chest x-ray showed diffuse dense bilateral pulmonary infiltrates. He also had mucous plugging and required suctioning. He got almost 7-10 days of Azactam but not much improvement. Therefore treated with meropenem.  However, he continued to have worsening therefore again treated with ceftazidime. He improved with the ceftazidime with normalization of the white count. -However, as soon as the ceftazidime completed had worsening leukocytosis again.  Therefore treated again with meropenem.  As soon as the meropenem stopped he is having low-grade fever with increasing secretions.  Suggest to repeat chest imaging.  Also suggested repeat respiratory cultures.  In the meantime suggest to start  empiric cefepime.  Follow-up on the final respiratory cultures and adjust antibiotics accordingly. Pulmonary following.  Due to his chronic trach he is high risk for  recurrent tracheobronchitis.  He also has dysphagia and high risk for recurrent aspiration pneumonia despite being on antibiotics.  Pneumonia: He had secondary bacterial pneumonia with Pseudomonas at the acute facility for which he was already treated.  He received treatment with multiple rounds of different antibiotics.  While antibiotics are on he seems to improve.  However, as soon as antibiotics are stopped he starts having worsening leukocytosis. Antibiotics as mentioned above. He has dysphagia and at risk for aspiration and recurrent aspiration pneumonia and tracheobronchitis despite being on antibiotics.  -He had thrombocytopenia at the acute facility the exact etiology for which was unclear for which reason beta-lactam's were discontinued although low suspicion. Please monitor counts closely while on antibiotics.  COVID-19 infection: Patient was treated for this at the outside facility.  He received IL-6 inhibitor.  He is high risk for ARDS from the COVID-19 infection.  He is status post Decadron.  He received treatment with hydroxyurea, folic acid, fish oil. He is at risk for ARDS from the COVID-19 infection.  Continue supportive management per the primary team.  Leukocytosis: Likely secondary to recurrent pneumonia probably from aspiration.  Antibiotics and plan as mentioned above. Monitor counts closely.  Due to his dysphagia, chronic trach he is high risk for recurrent tracheobronchitis and recurrent aspiration pneumonia and worsening leukocytosis despite being on antibiotics.  Encephalopathy: Continue medications and supportive management by the primary team.  Bilateral lower extremity DVT: Further management per the primary team.  Patient also had anemia and required blood transfusion at the outside facility.  Continue to  monitor closely.  Atrial fibrillation with RVR: Cardiology consulted.  Echocardiogram showed Left ventricular ejection fraction 50 to 55%. Continue medications and management per the primary team and cardiology.  Acute kidney injury: Patient was on dialysis at the acute facility.  At this time renal function appears to be stable.  Please monitor BUN/creatinine closely while on the antibiotics.  Avoid nephrotoxic medications.  Further management per the primary team.  Protein-calorie malnutrition/dysphagia: Management per primary team.  Due to his dysphagia he is high risk for aspiration and recurrent aspiration pneumonia despite being on antibiotics.  Due to his complex medical problems he is high risk for worsening and decompensation.  Plan of care discussed with the primary team and pharmacy.   Subjective: He is having worsening leukocytosis again.  Continues to have increased secretions.  Objective: Vitals: Temperature 97.2, pulse 74, respiratory rate 30, blood pressure 165/88, oxygen saturation 96%   Examination: Constitutional: Ill-appearing male Head: Atraumatic, normocephalic Eyes: PERLA ENMT: Moist oral mucosa, poor dentition, no ear or nose lesions. Neck: Trach in place CVS: S1-S2 Respiratory: Coarse breath sounds, scattered rhonchi Abdomen: soft, positive bowel sounds  Musculoskeletal: Upper and lower extremity edema Neuro:  Severe debility with generalized weakness. Psych: stable Skin: no rashes   Data Reviewed: I have personally reviewed following labs and imaging studies  CBC: Recent Labs  Lab 03/06/21 0421 03/08/21 0424 03/10/21 0316 03/11/21 0500  WBC 12.1* 9.7 13.2* 16.9*  HGB 10.4* 10.4* 10.7* 12.4*  HCT 33.8* 33.9* 34.9* 40.2  MCV 95.5 96.3 96.9 95.9  PLT 406* 412* 384 442*    Basic Metabolic Panel: Recent Labs  Lab 03/07/21 0515 03/08/21 0424 03/09/21 0446 03/10/21 0316 03/11/21 0500  NA 144 147* 145 145 148*  K 3.4* 3.6 3.8 3.6 3.4*   CL 105 104 104 103 105  CO2 32 36* 32 35* 35*  GLUCOSE 147* 140* 146* 146* 144*  BUN 45*  46* 44* 43*  42* 36*  CREATININE 0.88 0.94 0.86 0.88 0.73  CALCIUM 9.3 9.6 9.4 9.4 9.8  MG 2.1 2.1 2.2 2.3 2.3  PHOS 3.1 3.4 3.3 3.2 2.6    GFR: CrCl cannot be calculated (Unknown ideal weight.).  Liver Function Tests: Recent Labs  Lab 03/06/21 0421 03/07/21 0515 03/09/21 0446 03/10/21 0316  ALBUMIN 2.7* 2.5* 2.6* 2.6*    CBG: No results for input(s): GLUCAP in the last 168 hours.   Recent Results (from the past 240 hour(s))  Culture, Respiratory w Gram Stain     Status: None   Collection Time: 03/03/21  1:50 PM   Specimen: Tracheal Aspirate; Respiratory  Result Value Ref Range Status   Specimen Description TRACHEAL ASPIRATE  Final   Special Requests NONE  Final   Gram Stain   Final    FEW WBC PRESENT,BOTH PMN AND MONONUCLEAR FEW GRAM NEGATIVE RODS FEW GRAM POSITIVE RODS RARE GRAM POSITIVE COCCI IN PAIRS    Culture   Final    ABUNDANT PSEUDOMONAS AERUGINOSA NO STAPHYLOCOCCUS AUREUS ISOLATED Performed at [facility], [address], [place], [place] [address]    Report Status 03/05/2021 FINAL  Final   Organism ID, Bacteria PSEUDOMONAS AERUGINOSA  Final      Susceptibility   Pseudomonas aeruginosa - MIC*    CEFTAZIDIME 8 SENSITIVE Sensitive     CIPROFLOXACIN 2 INTERMEDIATE Intermediate     GENTAMICIN <=1 SENSITIVE Sensitive     IMIPENEM >=16 RESISTANT Resistant     * ABUNDANT PSEUDOMONAS AERUGINOSA  Culture, Urine     Status: Abnormal   Collection Time: 03/04/21  1:10 AM   Specimen: Urine, Random  Result Value Ref Range Status   Specimen Description URINE, RANDOM  Final   Special Requests   Final    NONE Performed at [facility], [address] [place], [place] [address]    Culture MULTIPLE SPECIES PRESENT, SUGGEST RECOLLECTION (A)  Final   Report Status 03/05/2021 FINAL  Final  Culture, Urine     Status: None   Collection Time: 03/05/21 12:50 PM    Specimen: Urine, Random  Result Value Ref Range Status   Specimen Description URINE, RANDOM  Final   Special Requests NONE  Final   Culture   Final    NO GROWTH Performed at [facility], [address], [place], [place] [address]    Report Status 03/06/2021 FINAL  Final  Culture, Respiratory w Gram Stain     Status: None (Preliminary result)   Collection Time: 03/11/21 10:23 AM   Specimen: Tracheal Aspirate; Respiratory  Result Value Ref Range Status   Specimen Description TRACHEAL ASPIRATE  Final   Special Requests NONE  Final   Gram Stain   Final    MODERATE WBC PRESENT, PREDOMINANTLY PMN MODERATE SQUAMOUS EPITHELIAL CELLS PRESENT FEW GRAM NEGATIVE RODS RARE YEAST Performed at [facility], [address], [place], [place] [address]    Culture PENDING  Incomplete   Report Status PENDING  Incomplete     Radiology Studies: No results found.  Scheduled Meds: Please see MAR  Vonzella Nipple, MD  03/11/2021, 7:30 PM

## 2021-03-11 NOTE — Progress Notes (Signed)
Pulmonary Critical Care Medicine [facility] [place]   PULMONARY CRITICAL CARE SERVICE  PROGRESS NOTE  Date of Service: 03/11/2021  JOSIMAR CORNING  BJY:[id number]  DOB: 1956-04-07   DOA: 12/27/2020  Referring Physician: [name], MD  HPI: ASHDON GILLSON is a 65 y.o. adult seen for follow up of Acute on Chronic Respiratory Failure.  Remains off the ventilator right now is on T collar currently on 40% FiO2  Medications: Reviewed on Rounds  Physical Exam:  Vitals: Temperature is 97.2 pulse 74 respiratory rate is is 30 blood pressure is 165/88 saturations 96%  Ventilator Settings on T collar with an FiO2 40%  . General: Comfortable at this time . Eyes: Grossly normal lids, irises & conjunctiva . ENT: grossly tongue is normal . Neck: no obvious mass . Cardiovascular: S1 S2 normal no gallop . Respiratory: No rhonchi very coarse breath sounds . Abdomen: soft . Skin: no rash seen on limited exam . Musculoskeletal: not rigid . Psychiatric:unable to assess . Neurologic: no seizure no involuntary movements         Lab Data:   Basic Metabolic Panel: Recent Labs  Lab 03/07/21 0515 03/08/21 0424 03/09/21 0446 03/10/21 0316 03/11/21 0500  NA 144 147* 145 145 148*  K 3.4* 3.6 3.8 3.6 3.4*  CL 105 104 104 103 105  CO2 32 36* 32 35* 35*  GLUCOSE 147* 140* 146* 146* 144*  BUN 45*  46* 44* 43* 42* 36*  CREATININE 0.88 0.94 0.86 0.88 0.73  CALCIUM 9.3 9.6 9.4 9.4 9.8  MG 2.1 2.1 2.2 2.3 2.3  PHOS 3.1 3.4 3.3 3.2 2.6    ABG: Recent Labs  Lab 03/09/21 0800  PHART 7.373  PCO2ART 60.8*  PO2ART 71.6*  HCO3 34.6*  O2SAT 94.0    Liver Function Tests: Recent Labs  Lab 03/06/21 0421 03/07/21 0515 03/09/21 0446 03/10/21 0316  ALBUMIN 2.7* 2.5* 2.6* 2.6*   No results for input(s): LIPASE, AMYLASE in the last 168 hours. No results for input(s): AMMONIA in the last 168 hours.  CBC: Recent Labs  Lab 03/06/21 0421 03/08/21 0424 03/10/21 0316  03/11/21 0500  WBC 12.1* 9.7 13.2* 16.9*  HGB 10.4* 10.4* 10.7* 12.4*  HCT 33.8* 33.9* 34.9* 40.2  MCV 95.5 96.3 96.9 95.9  PLT 406* 412* 384 442*    Cardiac Enzymes: No results for input(s): CKTOTAL, CKMB, CKMBINDEX, TROPONINI in the last 168 hours.  BNP (last 3 results) Recent Labs    02/08/21 2018  BNP 179.5*    ProBNP (last 3 results) No results for input(s): PROBNP in the last 8760 hours.  Radiological Exams: No results found.  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 Acute on chronic respiratory failure hypoxia we will continue with the wean on T collar.  Secretions are still limiting factor.  We will continue to monitor closely. 2. COVID-19 virus infection recovery we will continue to monitor 3. ARDS treated slow improvement 4. Healthcare associated pneumonia treated 5. 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 The Patient requires high complexity decision making with multiple systems involvement.  Rounds were done with the Respiratory Therapy  Librarian, academic therapists and discussed with nursing staff also.  Allyne Gee, MD [facility] Pulmonary Critical Care Medicine Sleep Medicine

## 2021-03-12 ENCOUNTER — Other Ambulatory Visit (HOSPITAL_COMMUNITY): Payer: Federal, State, Local not specified - PPO

## 2021-03-12 DIAGNOSIS — J159 Unspecified bacterial pneumonia: Secondary | ICD-10-CM | POA: Diagnosis not present

## 2021-03-12 DIAGNOSIS — I87099 Postthrombotic syndrome with other complications of unspecified lower extremity: Secondary | ICD-10-CM | POA: Diagnosis not present

## 2021-03-12 DIAGNOSIS — U071 COVID-19: Secondary | ICD-10-CM | POA: Diagnosis not present

## 2021-03-12 DIAGNOSIS — J9621 Acute and chronic respiratory failure with hypoxia: Secondary | ICD-10-CM | POA: Diagnosis not present

## 2021-03-12 LAB — RENAL FUNCTION PANEL
Albumin: 3.1 g/dL — ABNORMAL LOW (ref 3.5–5.0)
Anion gap: 7 (ref 5–15)
BUN: 54 mg/dL — ABNORMAL HIGH (ref 8–23)
CO2: 34 mmol/L — ABNORMAL HIGH (ref 22–32)
Calcium: 9.9 mg/dL (ref 8.9–10.3)
Chloride: 105 mmol/L (ref 98–111)
Creatinine, Ser: 1.14 mg/dL (ref 0.61–1.24)
GFR, Estimated: >60 mL/min (ref >60)
Glucose, Bld: 145 mg/dL — ABNORMAL HIGH (ref 70–99)
Phosphorus: 2.8 mg/dL (ref 2.5–4.6)
Potassium: 3.9 mmol/L (ref 3.5–5.1)
Sodium: 146 mmol/L — ABNORMAL HIGH (ref 135–145)

## 2021-03-12 LAB — CBC
HCT: 38.7 % — ABNORMAL LOW (ref 39.0–52.0)
Hemoglobin: 11.8 g/dL — ABNORMAL LOW (ref 13.0–17.0)
MCH: 29.2 pg (ref 26.0–34.0)
MCHC: 30.5 g/dL (ref 30.0–36.0)
MCV: 95.8 fL (ref 80.0–100.0)
Platelets: 372 10*3/uL (ref 150–400)
RBC: 4.04 MIL/uL — ABNORMAL LOW (ref 4.22–5.81)
RDW: 17.2 % — ABNORMAL HIGH (ref 11.5–15.5)
WBC: 15 10*3/uL — ABNORMAL HIGH (ref 4.0–10.5)
nRBC: 0 % (ref 0.0–0.2)

## 2021-03-12 LAB — URINALYSIS, ROUTINE W REFLEX MICROSCOPIC
Bilirubin Urine: NEGATIVE
Glucose, UA: NEGATIVE mg/dL
Hgb urine dipstick: NEGATIVE
Ketones, ur: NEGATIVE mg/dL
Leukocytes,Ua: NEGATIVE
Nitrite: NEGATIVE
Protein, ur: 30 mg/dL — AB
Specific Gravity, Urine: 1.016 (ref 1.005–1.030)
pH: 5 (ref 5.0–8.0)

## 2021-03-12 LAB — MAGNESIUM: Magnesium: 2.5 mg/dL — ABNORMAL HIGH (ref 1.7–2.4)

## 2021-03-13 DIAGNOSIS — U071 COVID-19: Secondary | ICD-10-CM | POA: Diagnosis not present

## 2021-03-13 DIAGNOSIS — J9621 Acute and chronic respiratory failure with hypoxia: Secondary | ICD-10-CM | POA: Diagnosis not present

## 2021-03-13 DIAGNOSIS — I87099 Postthrombotic syndrome with other complications of unspecified lower extremity: Secondary | ICD-10-CM | POA: Diagnosis not present

## 2021-03-13 DIAGNOSIS — J159 Unspecified bacterial pneumonia: Secondary | ICD-10-CM | POA: Diagnosis not present

## 2021-03-13 LAB — CULTURE, RESPIRATORY W GRAM STAIN

## 2021-03-13 LAB — URINE CULTURE: Culture: NO GROWTH

## 2021-03-13 LAB — BASIC METABOLIC PANEL
Anion gap: 6 (ref 5–15)
BUN: 47 mg/dL — ABNORMAL HIGH (ref 8–23)
CO2: 37 mmol/L — ABNORMAL HIGH (ref 22–32)
Calcium: 10 mg/dL (ref 8.9–10.3)
Chloride: 105 mmol/L (ref 98–111)
Creatinine, Ser: 0.84 mg/dL (ref 0.61–1.24)
GFR, Estimated: >60 mL/min (ref >60)
Glucose, Bld: 133 mg/dL — ABNORMAL HIGH (ref 70–99)
Potassium: 3.6 mmol/L (ref 3.5–5.1)
Sodium: 148 mmol/L — ABNORMAL HIGH (ref 135–145)

## 2021-03-13 NOTE — Progress Notes (Signed)
Pulmonary Critical Care Medicine [facility] [place]   PULMONARY CRITICAL CARE SERVICE  PROGRESS NOTE  Date of Service: 03/13/2021   DEMARCO BACCI  NOM:[id number]  DOB: 06-Feb-1956   DOA: 12/27/2020  Referring Physician: [name], MD  HPI: Scott SCHARA is a 65 y.o. adult seen for follow up of Acute on Chronic Respiratory Failure.  Patient is on T collar currently has been on 40% FiO2 also noted to have some desaturations  Medications: Reviewed on Rounds  Physical Exam:  Vitals: Temperature is 97.0 pulse 91 respiratory 20 blood pressure 159/84 saturations 95  Ventilator Settings on T collar with an FiO2 of 28%  . General: Comfortable at this time . Eyes: Grossly normal lids, irises & conjunctiva . ENT: grossly tongue is normal . Neck: no obvious mass . Cardiovascular: S1 S2 normal no gallop . Respiratory: No rhonchi no rales are noted at this . Abdomen: soft . Skin: no rash seen on limited exam . Musculoskeletal: not rigid . Psychiatric:unable to assess . Neurologic: no seizure no involuntary movements         Lab Data:   Basic Metabolic Panel: Recent Labs  Lab 03/08/21 0424 03/09/21 0446 03/10/21 0316 03/11/21 0500 03/12/21 0605 03/13/21 0443  NA 147* 145 145 148* 146* 148*  K 3.6 3.8 3.6 3.4* 3.9 3.6  CL 104 104 103 105 105 105  CO2 36* 32 35* 35* 34* 37*  GLUCOSE 140* 146* 146* 144* 145* 133*  BUN 44* 43* 42* 36* 54* 47*  CREATININE 0.94 0.86 0.88 0.73 1.14 0.84  CALCIUM 9.6 9.4 9.4 9.8 9.9 10.0  MG 2.1 2.2 2.3 2.3 2.5*  --   PHOS 3.4 3.3 3.2 2.6 2.8  --     ABG: Recent Labs  Lab 03/09/21 0800  PHART 7.373  PCO2ART 60.8*  PO2ART 71.6*  HCO3 34.6*  O2SAT 94.0    Liver Function Tests: Recent Labs  Lab 03/07/21 0515 03/09/21 0446 03/10/21 0316 03/12/21 0605  ALBUMIN 2.5* 2.6* 2.6* 3.1*   No results for input(s): LIPASE, AMYLASE in the last 168 hours. No results for input(s): AMMONIA in the last 168 hours.  CBC: Recent  Labs  Lab 03/08/21 0424 03/10/21 0316 03/11/21 0500 03/12/21 0605  WBC 9.7 13.2* 16.9* 15.0*  HGB 10.4* 10.7* 12.4* 11.8*  HCT 33.9* 34.9* 40.2 38.7*  MCV 96.3 96.9 95.9 95.8  PLT 412* 384 442* 372    Cardiac Enzymes: No results for input(s): CKTOTAL, CKMB, CKMBINDEX, TROPONINI in the last 168 hours.  BNP (last 3 results) Recent Labs    02/08/21 2018  BNP 179.5*    ProBNP (last 3 results) No results for input(s): PROBNP in the last 8760 hours.  Radiological Exams: DG Chest Port 1 View  Result Date: 03/12/2021 CLINICAL DATA:  65 year old male with respiratory failure. History of COVID-19. EXAM: PORTABLE CHEST 1 VIEW COMPARISON:  Portable chest 03/09/2021 and earlier. FINDINGS: Portable AP semi upright view at 0519 hours. Stable tracheostomy. Stable lung volumes and mediastinal contours. Veiling opacity on both sides has mildly regressed, still moderate on the right. At the same time upper lung ventilation has improved, with regressed but not resolved diffuse interstitial opacity. No air bronchograms identified. No areas of worsening ventilation. Stable visualized osseous structures. IMPRESSION: Regressed bilateral veiling and interstitial pulmonary opacity from 03/09/2021 with modestly improved bilateral ventilation. Favor COVID-19 pneumonia with superimposed right greater than left pleural effusions and lower lobe collapse. Electronically Signed   By: Althea Grimmer.D.  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 Acute on chronic respiratory failure hypoxia we will continue with the T-piece 2. COVID-19 virus infection in resolution 3. ARDS slow improvement 4. Healthcare associated pneumonia  treated 5. Chronic DVT treated we will continue to monitor   I have personally seen and evaluated the patient, evaluated laboratory and imaging results, formulated the assessment and plan and placed orders. The Patient requires high complexity decision making with multiple systems involvement.  Rounds were done with the Respiratory Therapy Director and Staff therapists and discussed with nursing staff also.  Yevonne Pax, MD [facility] Pulmonary Critical Care Medicine Sleep Medicine

## 2021-03-13 NOTE — Progress Notes (Signed)
Pulmonary Critical Care Medicine [facility] [place]   PULMONARY CRITICAL CARE SERVICE  PROGRESS NOTE    Scott Roberts  WUJ:[id number]  DOB: January 02, 1956   DOA: 12/27/2020  Referring Physician: [name], MD  HPI: Scott Roberts is a 65 y.o. adult seen for follow up of Acute on Chronic Respiratory Failure. Now is off of the ventilator on T collar has been on 40% FiO2 with good saturations.  Medications: Reviewed on Rounds  Physical Exam:  Vitals: Temperature is 97.3 pulse 96 respiratory rate was 29 blood pressure 155/85 saturations 98%  Ventilator Settings on T collar FiO2 40%  . General: Comfortable at this time . Eyes: Grossly normal lids, irises & conjunctiva . ENT: grossly tongue is normal . Neck: no obvious mass . Cardiovascular: S1 S2 normal no gallop . Respiratory: Scattered rhonchi expansion is equal . Abdomen: soft . Skin: no rash seen on limited exam . Musculoskeletal: not rigid . Psychiatric:unable to assess . Neurologic: no seizure no involuntary movements         Lab Data:   Basic Metabolic Panel: Recent Labs  Lab 03/08/21 0424 03/09/21 0446 03/10/21 0316 03/11/21 0500 03/12/21 0605 03/13/21 0443  NA 147* 145 145 148* 146* 148*  K 3.6 3.8 3.6 3.4* 3.9 3.6  CL 104 104 103 105 105 105  CO2 36* 32 35* 35* 34* 37*  GLUCOSE 140* 146* 146* 144* 145* 133*  BUN 44* 43* 42* 36* 54* 47*  CREATININE 0.94 0.86 0.88 0.73 1.14 0.84  CALCIUM 9.6 9.4 9.4 9.8 9.9 10.0  MG 2.1 2.2 2.3 2.3 2.5*  --   PHOS 3.4 3.3 3.2 2.6 2.8  --     ABG: Recent Labs  Lab 03/09/21 0800  PHART 7.373  PCO2ART 60.8*  PO2ART 71.6*  HCO3 34.6*  O2SAT 94.0    Liver Function Tests: Recent Labs  Lab 03/07/21 0515 03/09/21 0446 03/10/21 0316 03/12/21 0605  ALBUMIN 2.5* 2.6* 2.6* 3.1*   No results for input(s): LIPASE, AMYLASE in the last 168 hours. No results for input(s): AMMONIA in the last 168 hours.  CBC: Recent Labs  Lab 03/08/21 0424  03/10/21 0316 03/11/21 0500 03/12/21 0605  WBC 9.7 13.2* 16.9* 15.0*  HGB 10.4* 10.7* 12.4* 11.8*  HCT 33.9* 34.9* 40.2 38.7*  MCV 96.3 96.9 95.9 95.8  PLT 412* 384 442* 372    Cardiac Enzymes: No results for input(s): CKTOTAL, CKMB, CKMBINDEX, TROPONINI in the last 168 hours.  BNP (last 3 results) Recent Labs    02/08/21 2018  BNP 179.5*    ProBNP (last 3 results) No results for input(s): PROBNP in the last 8760 hours.  Radiological Exams: DG Chest Port 1 View  Result Date: 03/12/2021 CLINICAL DATA:  65 year old male with respiratory failure. History of COVID-19. EXAM: PORTABLE CHEST 1 VIEW COMPARISON:  Portable chest 03/09/2021 and earlier. FINDINGS: Portable AP semi upright view at 0519 hours. Stable tracheostomy. Stable lung volumes and mediastinal contours. Veiling opacity on both sides has mildly regressed, still moderate on the right. At the same time upper lung ventilation has improved, with regressed but not resolved diffuse interstitial opacity. No air bronchograms identified. No areas of worsening ventilation. Stable visualized osseous structures. IMPRESSION: Regressed bilateral veiling and interstitial pulmonary opacity from 03/09/2021 with modestly improved bilateral ventilation. Favor COVID-19 pneumonia with superimposed right greater than left pleural effusions and lower lobe collapse. Electronically Signed   By: Odessa Fleming M.D.   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 Acute on chronic respiratory failure hypoxia plan is to continue with the T collar titrate the oxygen down as tolerated. 2. COVID-19 virus infection recovery 3. ARDS improving 4. Healthcare associated pneumonia  treated slow improvement. 5. Chronic DVT treated   I have personally seen and evaluated the patient, evaluated laboratory and imaging results, formulated the assessment and plan and placed orders. The Patient requires high complexity decision making with multiple systems involvement.  Rounds were done with the Respiratory Therapy Director and Staff therapists and discussed with nursing staff also.  Yevonne Pax, MD [facility] Pulmonary Critical Care Medicine Sleep Medicine

## 2021-03-14 DIAGNOSIS — I87099 Postthrombotic syndrome with other complications of unspecified lower extremity: Secondary | ICD-10-CM | POA: Diagnosis not present

## 2021-03-14 DIAGNOSIS — J9621 Acute and chronic respiratory failure with hypoxia: Secondary | ICD-10-CM | POA: Diagnosis not present

## 2021-03-14 DIAGNOSIS — J159 Unspecified bacterial pneumonia: Secondary | ICD-10-CM | POA: Diagnosis not present

## 2021-03-14 DIAGNOSIS — U071 COVID-19: Secondary | ICD-10-CM | POA: Diagnosis not present

## 2021-03-14 LAB — CREATININE, SERUM
Creatinine, Ser: 0.82 mg/dL (ref 0.61–1.24)
GFR, Estimated: >60 mL/min (ref >60)

## 2021-03-14 LAB — SARS CORONAVIRUS 2 (TAT 6-24 HRS): SARS Coronavirus 2: NEGATIVE

## 2021-03-14 NOTE — Progress Notes (Signed)
Pulmonary Critical Care Medicine [facility] [place]   PULMONARY CRITICAL CARE SERVICE  PROGRESS NOTE  Date of Service: 03/14/2021   Scott Roberts  [id number]  DOB: [birth year]   DOA: 12/27/2020  Referring Physician: [name], MD  HPI: Scott Roberts is a 65 y.o. adult seen for follow up of Acute on Chronic Respiratory Failure.  Patient is off the ventilator on 40% FiO2 supposed to have a thoracentesis done today  Medications: Reviewed on Rounds  Physical Exam:  Vitals: Temperature is 96.2 pulse 78 respiratory rate is 28 blood pressure 148/78 saturations 96%  Ventilator Settings on T collar FiO2 40%  . General: Comfortable at this time . Eyes: Grossly normal lids, irises & conjunctiva . ENT: grossly tongue is normal . Neck: no obvious mass . Cardiovascular: S1 S2 normal no gallop . Respiratory: No rhonchi very coarse breath sound . Abdomen: soft . Skin: no rash seen on limited exam . Musculoskeletal: not rigid . Psychiatric:unable to assess . Neurologic: no seizure no involuntary movements         Lab Data:   Basic Metabolic Panel: Recent Labs  Lab 03/08/21 0424 03/09/21 0446 03/10/21 0316 03/11/21 0500 03/12/21 0605 03/13/21 0443 03/14/21 0424  NA 147* 145 145 148* 146* 148*  --   K 3.6 3.8 3.6 3.4* 3.9 3.6  --   CL 104 104 103 105 105 105  --   CO2 36* 32 35* 35* 34* 37*  --   GLUCOSE 140* 146* 146* 144* 145* 133*  --   BUN 44* 43* 42* 36* 54* 47*  --   CREATININE 0.94 0.86 0.88 0.73 1.14 0.84 0.82  CALCIUM 9.6 9.4 9.4 9.8 9.9 10.0  --   MG 2.1 2.2 2.3 2.3 2.5*  --   --   PHOS 3.4 3.3 3.2 2.6 2.8  --   --     ABG: Recent Labs  Lab 03/09/21 0800  PHART 7.373  PCO2ART 60.8*  PO2ART 71.6*  HCO3 34.6*  O2SAT 94.0    Liver Function Tests: Recent Labs  Lab 03/09/21 0446 03/10/21 0316 03/12/21 0605  ALBUMIN 2.6* 2.6* 3.1*   No results for input(s): LIPASE, AMYLASE in the last 168 hours. No results for input(s): AMMONIA in the  last 168 hours.  CBC: Recent Labs  Lab 03/08/21 0424 03/10/21 0316 03/11/21 0500 03/12/21 0605  WBC 9.7 13.2* 16.9* 15.0*  HGB 10.4* 10.7* 12.4* 11.8*  HCT 33.9* 34.9* 40.2 38.7*  MCV 96.3 96.9 95.9 95.8  PLT 412* 384 442* 372    Cardiac Enzymes: No results for input(s): CKTOTAL, CKMB, CKMBINDEX, TROPONINI in the last 168 hours.  BNP (last 3 results) Recent Labs    02/08/21 2018  BNP 179.5*    ProBNP (last 3 results) No results for input(s): PROBNP in the last 8760 hours.  Radiological Exams: No results found.  Assessment/Plan Active Problems:   Acute on chronic respiratory failure with hypoxia (HCC)   COVID-19 virus infection   Acute respiratory distress syndrome (ARDS) due to COVID-19 virus [facility])   Healthcare associated bacterial pneumonia   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 Acute on chronic respiratory failure hypoxia we will continue with T collar supportive care. 2. COVID-19 virus infection recovery phase 3. ARDS no change 4. Healthcare associated pneumonia treated slow improvement 5. Chronic DVT patient is at baseline   I have personally seen and evaluated the patient, evaluated laboratory and imaging results, formulated the assessment and plan  and placed orders. The Patient requires high complexity decision making with multiple systems involvement.  Rounds were done with the Respiratory Therapy Director and Staff therapists and discussed with nursing staff also.  Allyne Gee, MD [facility] Pulmonary Critical Care Medicine Sleep Medicine

## 2021-03-15 ENCOUNTER — Other Ambulatory Visit (HOSPITAL_COMMUNITY): Payer: Federal, State, Local not specified - PPO

## 2021-03-15 DIAGNOSIS — J9621 Acute and chronic respiratory failure with hypoxia: Secondary | ICD-10-CM | POA: Diagnosis not present

## 2021-03-15 DIAGNOSIS — I87099 Postthrombotic syndrome with other complications of unspecified lower extremity: Secondary | ICD-10-CM | POA: Diagnosis not present

## 2021-03-15 DIAGNOSIS — U071 COVID-19: Secondary | ICD-10-CM | POA: Diagnosis not present

## 2021-03-15 DIAGNOSIS — J159 Unspecified bacterial pneumonia: Secondary | ICD-10-CM | POA: Diagnosis not present

## 2021-03-15 HISTORY — PX: IR THORACENTESIS ASP PLEURAL SPACE W/IMG GUIDE: IMG5380

## 2021-03-15 LAB — CBC
HCT: 33.8 % — ABNORMAL LOW (ref 39.0–52.0)
Hemoglobin: 10.7 g/dL — ABNORMAL LOW (ref 13.0–17.0)
MCH: 30 pg (ref 26.0–34.0)
MCHC: 31.7 g/dL (ref 30.0–36.0)
MCV: 94.7 fL (ref 80.0–100.0)
Platelets: 320 10*3/uL (ref 150–400)
RBC: 3.57 MIL/uL — ABNORMAL LOW (ref 4.22–5.81)
RDW: 17.2 % — ABNORMAL HIGH (ref 11.5–15.5)
WBC: 12.8 10*3/uL — ABNORMAL HIGH (ref 4.0–10.5)
nRBC: 0 % (ref 0.0–0.2)

## 2021-03-15 LAB — BASIC METABOLIC PANEL
Anion gap: 8 (ref 5–15)
BUN: 53 mg/dL — ABNORMAL HIGH (ref 8–23)
CO2: 32 mmol/L (ref 22–32)
Calcium: 9.9 mg/dL (ref 8.9–10.3)
Chloride: 103 mmol/L (ref 98–111)
Creatinine, Ser: 0.74 mg/dL (ref 0.61–1.24)
GFR, Estimated: >60 mL/min (ref >60)
Glucose, Bld: 132 mg/dL — ABNORMAL HIGH (ref 70–99)
Potassium: 4.2 mmol/L (ref 3.5–5.1)
Sodium: 143 mmol/L (ref 135–145)

## 2021-03-15 LAB — MAGNESIUM: Magnesium: 2.2 mg/dL (ref 1.7–2.4)

## 2021-03-15 MED ORDER — LIDOCAINE HCL 1 % IJ SOLN
INTRAMUSCULAR | Status: DC | PRN
  Administered 2021-03-15: 10 mL

## 2021-03-15 NOTE — Procedures (Signed)
Ultrasound-guided  therapeutic right thoracentesis performed yielding 1.8 liters of yellow fluid. No immediate complications. Follow-up chest x-ray pending. EBL< 2 cc.

## 2021-03-16 DIAGNOSIS — J159 Unspecified bacterial pneumonia: Secondary | ICD-10-CM | POA: Diagnosis not present

## 2021-03-16 DIAGNOSIS — J9621 Acute and chronic respiratory failure with hypoxia: Secondary | ICD-10-CM | POA: Diagnosis not present

## 2021-03-16 DIAGNOSIS — I87099 Postthrombotic syndrome with other complications of unspecified lower extremity: Secondary | ICD-10-CM | POA: Diagnosis not present

## 2021-03-16 DIAGNOSIS — U071 COVID-19: Secondary | ICD-10-CM | POA: Diagnosis not present

## 2021-03-16 NOTE — Progress Notes (Signed)
Pulmonary Critical Care Medicine [facility] [place]   PULMONARY CRITICAL CARE SERVICE  PROGRESS NOTE  Date of Service: 03/16/2021   Scott Roberts  [id number]  DOB: [birth year]   DOA: 12/27/2020  Referring Physician: [name], MD  HPI: Scott Roberts is a 65 y.o. adult seen for follow up of Acute on Chronic Respiratory Failure.  Currently patient is afebrile has been on T-piece on 40% FiO2 appears comfortable.  Medications: Reviewed on Rounds  Physical Exam:  Vitals: Temperature is 96.9 pulse 102 respiratory rate is 36 blood pressure is 136/89 saturations 100%  Ventilator Settings on T-piece FiO2 40%  . General: Comfortable at this time . Eyes: Grossly normal lids, irises & conjunctiva . ENT: grossly tongue is normal . Neck: no obvious mass . Cardiovascular: S1 S2 normal no gallop . Respiratory: Coarse breath sounds with a few scattered rhonchi . Abdomen: soft . Skin: no rash seen on limited exam . Musculoskeletal: not rigid . Psychiatric:unable to assess . Neurologic: no seizure no involuntary movements         Lab Data:   Basic Metabolic Panel: Recent Labs  Lab 03/10/21 0316 03/11/21 0500 03/12/21 0605 03/13/21 0443 03/14/21 0424 03/15/21 0405  NA 145 148* 146* 148*  --  143  K 3.6 3.4* 3.9 3.6  --  4.2  CL 103 105 105 105  --  103  CO2 35* 35* 34* 37*  --  32  GLUCOSE 146* 144* 145* 133*  --  132*  BUN 42* 36* 54* 47*  --  53*  CREATININE 0.88 0.73 1.14 0.84 0.82 0.74  CALCIUM 9.4 9.8 9.9 10.0  --  9.9  MG 2.3 2.3 2.5*  --   --  2.2  PHOS 3.2 2.6 2.8  --   --   --     ABG: No results for input(s): PHART, PCO2ART, PO2ART, HCO3, O2SAT in the last 168 hours.  Liver Function Tests: Recent Labs  Lab 03/10/21 0316 03/12/21 0605  ALBUMIN 2.6* 3.1*   No results for input(s): LIPASE, AMYLASE in the last 168 hours. No results for input(s): AMMONIA in the last 168 hours.  CBC: Recent Labs  Lab 03/10/21 0316 03/11/21 0500  03/12/21 0605 03/15/21 0405  WBC 13.2* 16.9* 15.0* 12.8*  HGB 10.7* 12.4* 11.8* 10.7*  HCT 34.9* 40.2 38.7* 33.8*  MCV 96.9 95.9 95.8 94.7  PLT 384 442* 372 320    Cardiac Enzymes: No results for input(s): CKTOTAL, CKMB, CKMBINDEX, TROPONINI in the last 168 hours.  BNP (last 3 results) Recent Labs    02/08/21 2018  BNP 179.5*    ProBNP (last 3 results) No results for input(s): PROBNP in the last 8760 hours.  Radiological Exams: DG Chest 1 View  Result Date: 03/15/2021 CLINICAL DATA:  Status post thoracentesis EXAM: CHEST  1 VIEW COMPARISON:  03/12/2021 FINDINGS: Single frontal view of the chest demonstrates stable tracheostomy tube. The cardiac silhouette is enlarged but stable. Right pleural effusion has decreased in the interim, with small residual right pleural effusion obscuring the right costophrenic angle. No evidence of pneumothorax. The left pleural effusion may be slightly increased since prior study. There is persistent left basilar consolidation likely atelectasis. Diffuse interstitial prominence and vascular congestion are again noted unchanged. Scattered ground-glass opacities are seen throughout the lungs bilaterally, stable. IMPRESSION: 1. Decreased right pleural effusion. 2. Slightly increased left pleural effusion. 3. Likely a combination of multifocal pneumonia and pulmonary edema as described above. Electronically Signed   By: Casimiro Needle  Manson Passey M.D.   On: 03/15/2021 15:56   IR THORACENTESIS ASP PLEURAL SPACE W/IMG GUIDE  Result Date: 03/15/2021 INDICATION: Patient with history of respiratory failure, prior COVID-19 infection/pseudomonas pneumonia, right pleural effusion; request received for therapeutic right thoracentesis EXAM: ULTRASOUND GUIDED THERAPEUTIC RIGHT THORACENTESIS MEDICATIONS: 15 lidocaine to skin/SQ tissue COMPLICATIONS: None immediate. PROCEDURE: An ultrasound guided thoracentesis was thoroughly discussed with the patient/spouse and questions answered.  The benefits, risks, alternatives and complications were also discussed. The patient understands and wishes to proceed with the procedure. Verbal/telephone consent was obtained. Ultrasound was performed to localize and mark an adequate pocket of fluid in the right chest. The area was then prepped and draped in the normal sterile fashion. 1% Lidocaine was used for local anesthesia. Under ultrasound guidance a 6 Fr Safe-T-Centesis catheter was introduced. Thoracentesis was performed. The catheter was removed and a dressing applied. FINDINGS: A total of approximately 1.8 liters of yellow fluid was removed. IMPRESSION: Successful ultrasound guided therapeutic right thoracentesis yielding 1.8 liters of pleural fluid. Read by: Artemio Aly Electronically Signed   By: Gilmer Mor D.O.   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 Acute on chronic respiratory failure hypoxia we will continue with weaning on T-piece.  Patient did have a thoracentesis done with removal of 1.8 L of yellowish fluid.  Appears to be breathing a little bit better we will continue to advance. 2. COVID-19 virus infection recovery we will continue with supportive care. 3. ARDS slow improvement 4. Healthcare associated pneumonia treated 5. DVT treated   I have personally seen and evaluated the patient, evaluated laboratory and imaging results, formulated the assessment and plan and placed orders. The Patient requires high complexity decision making with multiple systems involvement.  Rounds were done with the Respiratory Therapy Director and Staff therapists and discussed with nursing staff also.  Yevonne Pax, MD [facility] Pulmonary Critical  Care Medicine Sleep Medicine

## 2021-03-16 NOTE — Progress Notes (Addendum)
Pulmonary Critical Care Medicine [facility] [place]   PULMONARY CRITICAL CARE SERVICE  PROGRESS NOTE     Scott Roberts  [id number]  DOB: 15-Jul-1956   DOA: 12/27/2020  Referring Physician: [name], MD  HPI: Scott Roberts is a 65 y.o. adult seen for follow up of Acute on Chronic Respiratory Failure.  Patient is resting comfortably without distress has been on T collar on 50% FiO2  Medications: Reviewed on Rounds  Physical Exam:  Vitals: Temperature is 96.5 pulse 62 respiratory 35 blood pressure is 154/73 saturations 95%  Ventilator Settings on T collar 50% FiO2  . General: Comfortable at this time . Eyes: Grossly normal lids, irises & conjunctiva . ENT: grossly tongue is normal . Neck: no obvious mass . Cardiovascular: S1 S2 normal no gallop . Respiratory: Scattered rhonchi very coarse breath sound . Abdomen: soft . Skin: no Roberts seen on limited exam . Musculoskeletal: not rigid . Psychiatric:unable to assess . Neurologic: no seizure no involuntary movements         Lab Data:   Basic Metabolic Panel: Recent Labs  Lab 03/10/21 0316 03/11/21 0500 03/12/21 0605 03/13/21 0443 03/14/21 0424 03/15/21 0405  NA 145 148* 146* 148*  --  143  K 3.6 3.4* 3.9 3.6  --  4.2  CL 103 105 105 105  --  103  CO2 35* 35* 34* 37*  --  32  GLUCOSE 146* 144* 145* 133*  --  132*  BUN 42* 36* 54* 47*  --  53*  CREATININE 0.88 0.73 1.14 0.84 0.82 0.74  CALCIUM 9.4 9.8 9.9 10.0  --  9.9  MG 2.3 2.3 2.5*  --   --  2.2  PHOS 3.2 2.6 2.8  --   --   --     ABG: No results for input(s): PHART, PCO2ART, PO2ART, HCO3, O2SAT in the last 168 hours.  Liver Function Tests: Recent Labs  Lab 03/10/21 0316 03/12/21 0605  ALBUMIN 2.6* 3.1*   No results for input(s): LIPASE, AMYLASE in the last 168 hours. No results for input(s): AMMONIA in the last 168 hours.  CBC: Recent Labs  Lab 03/10/21 0316 03/11/21 0500 03/12/21 0605 03/15/21 0405  WBC 13.2* 16.9* 15.0*  12.8*  HGB 10.7* 12.4* 11.8* 10.7*  HCT 34.9* 40.2 38.7* 33.8*  MCV 96.9 95.9 95.8 94.7  PLT 384 442* 372 320    Cardiac Enzymes: No results for input(s): CKTOTAL, CKMB, CKMBINDEX, TROPONINI in the last 168 hours.  BNP (last 3 results) Recent Labs    02/08/21 2018  BNP 179.5*    ProBNP (last 3 results) No results for input(s): PROBNP in the last 8760 hours.  Radiological Exams: DG Chest 1 View  Result Date: 03/15/2021 CLINICAL DATA:  Status post thoracentesis EXAM: CHEST  1 VIEW COMPARISON:  03/12/2021 FINDINGS: Single frontal view of the chest demonstrates stable tracheostomy tube. The cardiac silhouette is enlarged but stable. Right pleural effusion has decreased in the interim, with small residual right pleural effusion obscuring the right costophrenic angle. No evidence of pneumothorax. The left pleural effusion may be slightly increased since prior study. There is persistent left basilar consolidation likely atelectasis. Diffuse interstitial prominence and vascular congestion are again noted unchanged. Scattered ground-glass opacities are seen throughout the lungs bilaterally, stable. IMPRESSION: 1. Decreased right pleural effusion. 2. Slightly increased left pleural effusion. 3. Likely a combination of multifocal pneumonia and pulmonary edema as described above. Electronically Signed   By: Maxwell Caul.D.  On: 03/15/2021 15:56   IR THORACENTESIS ASP PLEURAL SPACE W/IMG GUIDE  Result Date: 03/15/2021 INDICATION: Patient with history of respiratory failure, prior COVID-19 infection/pseudomonas pneumonia, right pleural effusion; request received for therapeutic right thoracentesis EXAM: ULTRASOUND GUIDED THERAPEUTIC RIGHT THORACENTESIS MEDICATIONS: 15 lidocaine to skin/SQ tissue COMPLICATIONS: None immediate. PROCEDURE: An ultrasound guided thoracentesis was thoroughly discussed with the patient/spouse and questions answered. The benefits, risks, alternatives and complications were  also discussed. The patient understands and wishes to proceed with the procedure. Verbal/telephone consent was obtained. Ultrasound was performed to localize and mark an adequate pocket of fluid in the right chest. The area was then prepped and draped in the normal sterile fashion. 1% Lidocaine was used for local anesthesia. Under ultrasound guidance a 6 Fr Safe-T-Centesis catheter was introduced. Thoracentesis was performed. The catheter was removed and a dressing applied. FINDINGS: A total of approximately 1.8 liters of yellow fluid was removed. IMPRESSION: Successful ultrasound guided therapeutic right thoracentesis yielding 1.8 liters of pleural fluid. Read by: Artemio Aly Electronically Signed   By: Gilmer Mor D.O.   On: 03/15/2021 15:38    Assessment/Plan Active Problems:   Acute on chronic respiratory failure with hypoxia (HCC)   COVID-19 virus infection   Acute respiratory distress syndrome (ARDS) due to COVID-19 virus [facility])   Healthcare associated bacterial pneumonia   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 Acute on chronic respiratory failure hypoxia we will continue with T collar patient on 50% FiO2 good saturations 2. COVID-19 virus infection at baseline we will continue to monitor 3. ARDS slow resolution 4. Healthcare associated pneumonia at baseline 5. Chronic hypotension no change we will continue to follow along.   I have personally seen and evaluated the patient, evaluated laboratory and imaging results, formulated the assessment and plan and placed orders. The Patient requires high complexity decision making with multiple systems involvement.  Rounds were done with the Respiratory Therapy Director and Staff therapists and discussed with nursing staff also.  Yevonne Pax, MD [facility] Pulmonary Critical Care Medicine Sleep Medicine

## 2021-03-17 DIAGNOSIS — I87099 Postthrombotic syndrome with other complications of unspecified lower extremity: Secondary | ICD-10-CM | POA: Diagnosis not present

## 2021-03-17 DIAGNOSIS — J159 Unspecified bacterial pneumonia: Secondary | ICD-10-CM | POA: Diagnosis not present

## 2021-03-17 DIAGNOSIS — J9621 Acute and chronic respiratory failure with hypoxia: Secondary | ICD-10-CM | POA: Diagnosis not present

## 2021-03-17 DIAGNOSIS — U071 COVID-19: Secondary | ICD-10-CM | POA: Diagnosis not present

## 2021-03-17 NOTE — Progress Notes (Signed)
Pulmonary Critical Care Medicine [facility] [place]   PULMONARY CRITICAL CARE SERVICE  PROGRESS NOTE     Scott Roberts  ZOX:[id number]  DOB: 1956-06-24   DOA: 12/27/2020  Referring Physician: [name], MD  HPI: Scott Roberts is a 65 y.o. adult seen for follow up of Acute on Chronic Respiratory Failure.  Patient is comfortable right now without distress remains off the ventilator on T collar still has very copious secretions  Medications: Reviewed on Rounds  Physical Exam:  Vitals: Temperature is 97.3 pulse 85 respiratory rate is 29 blood pressure 150/81 saturations 97  Ventilator Settings off the ventilator on T collar FiO2 40%  . General: Comfortable at this time . Eyes: Grossly normal lids, irises & conjunctiva . ENT: grossly tongue is normal . Neck: no obvious mass . Cardiovascular: S1 S2 normal no gallop . Respiratory: No rhonchi no rales are noted at this time . Abdomen: soft . Skin: no rash seen on limited exam . Musculoskeletal: not rigid . Psychiatric:unable to assess . Neurologic: no seizure no involuntary movements         Lab Data:   Basic Metabolic Panel: Recent Labs  Lab 03/11/21 0500 03/12/21 0605 03/13/21 0443 03/14/21 0424 03/15/21 0405  NA 148* 146* 148*  --  143  K 3.4* 3.9 3.6  --  4.2  CL 105 105 105  --  103  CO2 35* 34* 37*  --  32  GLUCOSE 144* 145* 133*  --  132*  BUN 36* 54* 47*  --  53*  CREATININE 0.73 1.14 0.84 0.82 0.74  CALCIUM 9.8 9.9 10.0  --  9.9  MG 2.3 2.5*  --   --  2.2  PHOS 2.6 2.8  --   --   --     ABG: No results for input(s): PHART, PCO2ART, PO2ART, HCO3, O2SAT in the last 168 hours.  Liver Function Tests: Recent Labs  Lab 03/12/21 0605  ALBUMIN 3.1*   No results for input(s): LIPASE, AMYLASE in the last 168 hours. No results for input(s): AMMONIA in the last 168 hours.  CBC: Recent Labs  Lab 03/11/21 0500 03/12/21 0605 03/15/21 0405  WBC 16.9* 15.0* 12.8*  HGB 12.4* 11.8* 10.7*   HCT 40.2 38.7* 33.8*  MCV 95.9 95.8 94.7  PLT 442* 372 320    Cardiac Enzymes: No results for input(s): CKTOTAL, CKMB, CKMBINDEX, TROPONINI in the last 168 hours.  BNP (last 3 results) Recent Labs    02/08/21 2018  BNP 179.5*    ProBNP (last 3 results) No results for input(s): PROBNP in the last 8760 hours.  Radiological Exams: DG Chest 1 View  Result Date: 03/15/2021 CLINICAL DATA:  Status post thoracentesis EXAM: CHEST  1 VIEW COMPARISON:  03/12/2021 FINDINGS: Single frontal view of the chest demonstrates stable tracheostomy tube. The cardiac silhouette is enlarged but stable. Right pleural effusion has decreased in the interim, with small residual right pleural effusion obscuring the right costophrenic angle. No evidence of pneumothorax. The left pleural effusion may be slightly increased since prior study. There is persistent left basilar consolidation likely atelectasis. Diffuse interstitial prominence and vascular congestion are again noted unchanged. Scattered ground-glass opacities are seen throughout the lungs bilaterally, stable. IMPRESSION: 1. Decreased right pleural effusion. 2. Slightly increased left pleural effusion. 3. Likely a combination of multifocal pneumonia and pulmonary edema as described above. Electronically Signed   By: Sharlet Salina M.D.   On: 03/15/2021 15:56   IR THORACENTESIS ASP PLEURAL SPACE  W/IMG GUIDE  Result Date: 03/15/2021 INDICATION: Patient with history of respiratory failure, prior COVID-19 infection/pseudomonas pneumonia, right pleural effusion; request received for therapeutic right thoracentesis EXAM: ULTRASOUND GUIDED THERAPEUTIC RIGHT THORACENTESIS MEDICATIONS: 15 lidocaine to skin/SQ tissue COMPLICATIONS: None immediate. PROCEDURE: An ultrasound guided thoracentesis was thoroughly discussed with the patient/spouse and questions answered. The benefits, risks, alternatives and complications were also discussed. The patient understands and wishes  to proceed with the procedure. Verbal/telephone consent was obtained. Ultrasound was performed to localize and mark an adequate pocket of fluid in the right chest. The area was then prepped and draped in the normal sterile fashion. 1% Lidocaine was used for local anesthesia. Under ultrasound guidance a 6 Fr Safe-T-Centesis catheter was introduced. Thoracentesis was performed. The catheter was removed and a dressing applied. FINDINGS: A total of approximately 1.8 liters of yellow fluid was removed. IMPRESSION: Successful ultrasound guided therapeutic right thoracentesis yielding 1.8 liters of pleural fluid. Read by: Artemio Aly Electronically Signed   By: Gilmer Mor D.O.   On: 03/15/2021 15:38    Assessment/Plan Active Problems:   Acute on chronic respiratory failure with hypoxia (HCC)   COVID-19 virus infection   Acute respiratory distress syndrome (ARDS) due to COVID-19 virus [facility])   Healthcare associated bacterial pneumonia   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 Acute on chronic respiratory failure hypoxia we will continue with T collar on 40% FiO2 2. COVID-19 virus infection recovery we will continue to monitor. 3. ARDS slow improvement 4. Healthcare associated pneumonia has been treated with antibiotics also had pleural effusion status post thoracentesis 5. Chronic DVT treated   I have personally seen and evaluated the patient, evaluated laboratory and imaging results, formulated the assessment and plan and placed orders. The Patient requires high complexity decision making with multiple systems involvement.  Rounds were done with the Respiratory Therapy Director and Staff therapists and discussed with nursing staff also.  Yevonne Pax, MD [facility] Pulmonary Critical Care Medicine Sleep Medicine

## 2021-03-18 DIAGNOSIS — I87099 Postthrombotic syndrome with other complications of unspecified lower extremity: Secondary | ICD-10-CM | POA: Diagnosis not present

## 2021-03-18 DIAGNOSIS — J9621 Acute and chronic respiratory failure with hypoxia: Secondary | ICD-10-CM | POA: Diagnosis not present

## 2021-03-18 DIAGNOSIS — U071 COVID-19: Secondary | ICD-10-CM | POA: Diagnosis not present

## 2021-03-18 DIAGNOSIS — J159 Unspecified bacterial pneumonia: Secondary | ICD-10-CM | POA: Diagnosis not present

## 2021-03-18 LAB — BASIC METABOLIC PANEL
Anion gap: 7 (ref 5–15)
BUN: 41 mg/dL — ABNORMAL HIGH (ref 8–23)
CO2: 34 mmol/L — ABNORMAL HIGH (ref 22–32)
Calcium: 10.1 mg/dL (ref 8.9–10.3)
Chloride: 104 mmol/L (ref 98–111)
Creatinine, Ser: 0.64 mg/dL (ref 0.61–1.24)
GFR, Estimated: >60 mL/min (ref >60)
Glucose, Bld: 148 mg/dL — ABNORMAL HIGH (ref 70–99)
Potassium: 3 mmol/L — ABNORMAL LOW (ref 3.5–5.1)
Sodium: 145 mmol/L (ref 135–145)

## 2021-03-18 LAB — CBC
HCT: 33.6 % — ABNORMAL LOW (ref 39.0–52.0)
Hemoglobin: 10.5 g/dL — ABNORMAL LOW (ref 13.0–17.0)
MCH: 30.1 pg (ref 26.0–34.0)
MCHC: 31.3 g/dL (ref 30.0–36.0)
MCV: 96.3 fL (ref 80.0–100.0)
Platelets: 275 10*3/uL (ref 150–400)
RBC: 3.49 MIL/uL — ABNORMAL LOW (ref 4.22–5.81)
RDW: 17.1 % — ABNORMAL HIGH (ref 11.5–15.5)
WBC: 10.8 10*3/uL — ABNORMAL HIGH (ref 4.0–10.5)
nRBC: 0 % (ref 0.0–0.2)

## 2021-03-18 LAB — MAGNESIUM: Magnesium: 1.9 mg/dL (ref 1.7–2.4)

## 2021-03-18 NOTE — Progress Notes (Signed)
Pulmonary Critical Care Medicine [facility] [place]   PULMONARY CRITICAL CARE SERVICE  PROGRESS NOTE     Scott Roberts  [id number]:[id number]  DOB: [birth year]   DOA: 12/27/2020  Referring Physician: [name], MD  HPI: Scott Roberts is a 65 y.o. adult seen for follow up of Acute on Chronic Respiratory Failure.  Patient is comfortable right now without distress has been on T collar requiring approximately 40% FiO2 will try to wean this down.  Medications: Reviewed on Rounds  Physical Exam:  Vitals: Temperature is 97.7 pulse 102 respiratory 34 blood pressure is 154/76 saturations 97%  Ventilator Settings on T collar FiO2 40%  . General: Comfortable at this time . Eyes: Grossly normal lids, irises & conjunctiva . ENT: grossly tongue is normal . Neck: no obvious mass . Cardiovascular: S1 S2 normal no gallop . Respiratory: Scattered rhonchi expansion is equal at this time . Abdomen: soft . Skin: no rash seen on limited exam . Musculoskeletal: not rigid . Psychiatric:unable to assess . Neurologic: no seizure no involuntary movements         Lab Data:   Basic Metabolic Panel: Recent Labs  Lab 03/12/21 0605 03/13/21 0443 03/14/21 0424 03/15/21 0405 03/18/21 0441  NA 146* 148*  --  143 145  K 3.9 3.6  --  4.2 3.0*  CL 105 105  --  103 104  CO2 34* 37*  --  32 34*  GLUCOSE 145* 133*  --  132* 148*  BUN 54* 47*  --  53* 41*  CREATININE 1.14 0.84 0.82 0.74 0.64  CALCIUM 9.9 10.0  --  9.9 10.1  MG 2.5*  --   --  2.2 1.9  PHOS 2.8  --   --   --   --     ABG: No results for input(s): PHART, PCO2ART, PO2ART, HCO3, O2SAT in the last 168 hours.  Liver Function Tests: Recent Labs  Lab 03/12/21 0605  ALBUMIN 3.1*   No results for input(s): LIPASE, AMYLASE in the last 168 hours. No results for input(s): AMMONIA in the last 168 hours.  CBC: Recent Labs  Lab 03/12/21 0605 03/15/21 0405 03/18/21 0441  WBC 15.0* 12.8* 10.8*  HGB 11.8* 10.7* 10.5*  HCT  38.7* 33.8* 33.6*  MCV 95.8 94.7 96.3  PLT 372 320 275    Cardiac Enzymes: No results for input(s): CKTOTAL, CKMB, CKMBINDEX, TROPONINI in the last 168 hours.  BNP (last 3 results) Recent Labs    02/08/21 2018  BNP 179.5*    ProBNP (last 3 results) No results for input(s): PROBNP in the last 8760 hours.  Radiological Exams: No results found.  Assessment/Plan Active Problems:   Acute on chronic respiratory failure with hypoxia (HCC)   COVID-19 virus infection   Acute respiratory distress syndrome (ARDS) due to COVID-19 virus [facility])   Healthcare associated bacterial pneumonia   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 Acute on chronic respiratory failure hypoxia we will continue with T collar titrate oxygen continue pulmonary toilet. 2. COVID-19 virus infection recovery phase we will continue to monitor 3. ARDS treated supportive care 4. Healthcare associated pneumonia slow improvement 5. 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 The Patient requires high complexity decision making with multiple systems involvement.  Rounds were done with the Respiratory Therapy Director and Staff therapists and discussed with  nursing staff also.  Allyne Gee, MD [facility] Pulmonary Critical Care Medicine Sleep Medicine

## 2021-03-19 DIAGNOSIS — I87099 Postthrombotic syndrome with other complications of unspecified lower extremity: Secondary | ICD-10-CM | POA: Diagnosis not present

## 2021-03-19 DIAGNOSIS — U071 COVID-19: Secondary | ICD-10-CM | POA: Diagnosis not present

## 2021-03-19 DIAGNOSIS — J9621 Acute and chronic respiratory failure with hypoxia: Secondary | ICD-10-CM | POA: Diagnosis not present

## 2021-03-19 DIAGNOSIS — J159 Unspecified bacterial pneumonia: Secondary | ICD-10-CM | POA: Diagnosis not present

## 2021-03-19 LAB — POTASSIUM: Potassium: 3.7 mmol/L (ref 3.5–5.1)

## 2021-03-19 NOTE — Progress Notes (Signed)
Pulmonary Critical Care Medicine [facility] [place]   PULMONARY CRITICAL CARE SERVICE  PROGRESS NOTE     Scott Roberts  [id number]  DOB: Apr 15, 1956   DOA: 12/27/2020  Referring Physician: [name], MD  HPI: Scott Roberts is a 65 y.o. adult seen for follow up of Acute on Chronic Respiratory Failure.  Patient is on T collar on 40% FiO2 trach is ready to be changed out today  Medications: Reviewed on Rounds  Physical Exam:  Vitals: Temperature is 97.4 pulse 88 respiratory rate 30 blood pressure is 126/77 saturations 95%  Ventilator Settings off the ventilator on T collar  . General: Comfortable at this time . Eyes: Grossly normal lids, irises & conjunctiva . ENT: grossly tongue is normal . Neck: no obvious mass . Cardiovascular: S1 S2 normal no gallop . Respiratory: Scattered rhonchi very coarse breath sounds . Abdomen: soft . Skin: no rash seen on limited exam . Musculoskeletal: not rigid . Psychiatric:unable to assess . Neurologic: no seizure no involuntary movements         Lab Data:   Basic Metabolic Panel: Recent Labs  Lab 03/13/21 0443 03/14/21 0424 03/15/21 0405 03/18/21 0441 03/19/21 0502  NA 148*  --  143 145  --   K 3.6  --  4.2 3.0* 3.7  CL 105  --  103 104  --   CO2 37*  --  32 34*  --   GLUCOSE 133*  --  132* 148*  --   BUN 47*  --  53* 41*  --   CREATININE 0.84 0.82 0.74 0.64  --   CALCIUM 10.0  --  9.9 10.1  --   MG  --   --  2.2 1.9  --     ABG: No results for input(s): PHART, PCO2ART, PO2ART, HCO3, O2SAT in the last 168 hours.  Liver Function Tests: No results for input(s): AST, ALT, ALKPHOS, BILITOT, PROT, ALBUMIN in the last 168 hours. No results for input(s): LIPASE, AMYLASE in the last 168 hours. No results for input(s): AMMONIA in the last 168 hours.  CBC: Recent Labs  Lab 03/15/21 0405 03/18/21 0441  WBC 12.8* 10.8*  HGB 10.7* 10.5*  HCT 33.8* 33.6*  MCV 94.7 96.3  PLT 320 275    Cardiac Enzymes: No  results for input(s): CKTOTAL, CKMB, CKMBINDEX, TROPONINI in the last 168 hours.  BNP (last 3 results) Recent Labs    02/08/21 2018  BNP 179.5*    ProBNP (last 3 results) No results for input(s): PROBNP in the last 8760 hours.  Radiological Exams: No results found.  Assessment/Plan Active Problems:   Acute on chronic respiratory failure with hypoxia (HCC)   COVID-19 virus infection   Acute respiratory distress syndrome (ARDS) due to COVID-19 virus [facility])   Healthcare associated bacterial pneumonia   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 Acute on chronic respiratory failure hypoxia we will continue with T collar right now on 40% FiO2 continue with secretion management supportive care. 2. COVID-19 virus infection in recovery we will continue to monitor 3. ARDS treated 4. Healthcare associated pneumonia at baseline 5. 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 The Patient requires high complexity decision making with multiple systems involvement.  Rounds were done with the Respiratory Therapy Director and Staff therapists and discussed with nursing staff also.  Allyne Gee, MD [facility] Pulmonary Critical Care Medicine Sleep Medicine

## 2021-03-20 ENCOUNTER — Other Ambulatory Visit (HOSPITAL_COMMUNITY): Payer: Federal, State, Local not specified - PPO

## 2021-03-20 DIAGNOSIS — I87099 Postthrombotic syndrome with other complications of unspecified lower extremity: Secondary | ICD-10-CM | POA: Diagnosis not present

## 2021-03-20 DIAGNOSIS — J9621 Acute and chronic respiratory failure with hypoxia: Secondary | ICD-10-CM | POA: Diagnosis not present

## 2021-03-20 DIAGNOSIS — J159 Unspecified bacterial pneumonia: Secondary | ICD-10-CM | POA: Diagnosis not present

## 2021-03-20 DIAGNOSIS — U071 COVID-19: Secondary | ICD-10-CM | POA: Diagnosis not present

## 2021-03-20 LAB — BASIC METABOLIC PANEL
Anion gap: 5 (ref 5–15)
BUN: 42 mg/dL — ABNORMAL HIGH (ref 8–23)
CO2: 36 mmol/L — ABNORMAL HIGH (ref 22–32)
Calcium: 10.2 mg/dL (ref 8.9–10.3)
Chloride: 104 mmol/L (ref 98–111)
Creatinine, Ser: 0.63 mg/dL (ref 0.61–1.24)
GFR, Estimated: >60 mL/min (ref >60)
Glucose, Bld: 133 mg/dL — ABNORMAL HIGH (ref 70–99)
Potassium: 3.4 mmol/L — ABNORMAL LOW (ref 3.5–5.1)
Sodium: 145 mmol/L (ref 135–145)

## 2021-03-20 LAB — CBC
HCT: 31.2 % — ABNORMAL LOW (ref 39.0–52.0)
Hemoglobin: 9.7 g/dL — ABNORMAL LOW (ref 13.0–17.0)
MCH: 29.8 pg (ref 26.0–34.0)
MCHC: 31.1 g/dL (ref 30.0–36.0)
MCV: 95.7 fL (ref 80.0–100.0)
Platelets: 295 10*3/uL (ref 150–400)
RBC: 3.26 MIL/uL — ABNORMAL LOW (ref 4.22–5.81)
RDW: 16.9 % — ABNORMAL HIGH (ref 11.5–15.5)
WBC: 12 10*3/uL — ABNORMAL HIGH (ref 4.0–10.5)
nRBC: 0 % (ref 0.0–0.2)

## 2021-03-20 LAB — PHOSPHORUS: Phosphorus: 2.9 mg/dL (ref 2.5–4.6)

## 2021-03-20 LAB — MAGNESIUM: Magnesium: 2.1 mg/dL (ref 1.7–2.4)

## 2021-03-20 NOTE — Progress Notes (Signed)
Pulmonary Critical Care Medicine [facility] [place]   PULMONARY CRITICAL CARE SERVICE  PROGRESS NOTE     Scott Roberts  [id number]  DOB: 1956/09/11   DOA: 12/27/2020  Referring Physician: [name], MD  HPI: Scott Roberts is a 65 y.o. adult seen for follow up of Acute on Chronic Respiratory Failure.  Patient is on T collar on 40% FiO2.  Secretions are still significant patient has been tolerating the PMV no major difficulties noted.  Medications: Reviewed on Rounds  Physical Exam:  Vitals: Temperature 97.1 pulse 86 respiratory 26 blood pressure is 163/77 saturations 94%  Ventilator Settings on T collar with an FiO2 of 40% and PMV  . General: Comfortable at this time . Eyes: Grossly normal lids, irises & conjunctiva . ENT: grossly tongue is normal . Neck: no obvious mass . Cardiovascular: S1 S2 normal no gallop . Respiratory: Scattered rhonchi expansion is equal . Abdomen: soft . Skin: no rash seen on limited exam . Musculoskeletal: not rigid . Psychiatric:unable to assess . Neurologic: no seizure no involuntary movements         Lab Data:   Basic Metabolic Panel: Recent Labs  Lab 03/14/21 0424 03/15/21 0405 03/18/21 0441 03/19/21 0502 03/20/21 0617  NA  --  143 145  --  145  K  --  4.2 3.0* 3.7 3.4*  CL  --  103 104  --  104  CO2  --  32 34*  --  36*  GLUCOSE  --  132* 148*  --  133*  BUN  --  53* 41*  --  42*  CREATININE 0.82 0.74 0.64  --  0.63  CALCIUM  --  9.9 10.1  --  10.2  MG  --  2.2 1.9  --  2.1  PHOS  --   --   --   --  2.9    ABG: No results for input(s): PHART, PCO2ART, PO2ART, HCO3, O2SAT in the last 168 hours.  Liver Function Tests: No results for input(s): AST, ALT, ALKPHOS, BILITOT, PROT, ALBUMIN in the last 168 hours. No results for input(s): LIPASE, AMYLASE in the last 168 hours. No results for input(s): AMMONIA in the last 168 hours.  CBC: Recent Labs  Lab 03/15/21 0405 03/18/21 0441 03/20/21 0617  WBC  12.8* 10.8* 12.0*  HGB 10.7* 10.5* 9.7*  HCT 33.8* 33.6* 31.2*  MCV 94.7 96.3 95.7  PLT 320 275 295    Cardiac Enzymes: No results for input(s): CKTOTAL, CKMB, CKMBINDEX, TROPONINI in the last 168 hours.  BNP (last 3 results) Recent Labs    02/08/21 2018  BNP 179.5*    ProBNP (last 3 results) No results for input(s): PROBNP in the last 8760 hours.  Radiological Exams: DG CHEST PORT 1 VIEW  Result Date: 03/20/2021 CLINICAL DATA:  Pneumonia. EXAM: PORTABLE CHEST 1 VIEW COMPARISON:  03/15/2021. FINDINGS: Tracheostomy tube noted in stable position. Cardiomegaly. Diffuse bilateral pulmonary infiltrates/edema and small bilateral pleural effusions again noted without interim change. Left lower lobe atelectasis/consolidation noted. No pneumothorax. IMPRESSION: 1. Tracheostomy tube in stable position. 2. Stable cardiomegaly. 3. Diffuse bilateral pulmonary infiltrates/edema and small bilateral pleural effusions again noted without interim change. Left lower lobe atelectasis/consolidation noted. Electronically Signed   By: Maisie Fus  Register   On: 03/20/2021 06:05    Assessment/Plan Active Problems:   Acute on chronic respiratory failure with hypoxia (HCC)   COVID-19 virus infection   Acute respiratory distress syndrome (ARDS) due to COVID-19 virus (HCC)  Healthcare associated bacterial pneumonia   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 Acute on chronic respiratory failure hypoxia plan is to continue with T collar on 40% FiO2. 2. COVID-19 virus infection recovery phase 3. ARDS treated slow improvement 4. Healthcare associated pneumonia patient is at baseline 5. 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 The Patient requires high complexity decision making with multiple systems involvement.  Rounds were done with  the Respiratory Therapy Director and Staff therapists and discussed with nursing staff also.  Yevonne Pax, MD [facility] Pulmonary Critical Care Medicine Sleep Medicine

## 2021-03-21 DIAGNOSIS — J159 Unspecified bacterial pneumonia: Secondary | ICD-10-CM | POA: Diagnosis not present

## 2021-03-21 DIAGNOSIS — J9621 Acute and chronic respiratory failure with hypoxia: Secondary | ICD-10-CM | POA: Diagnosis not present

## 2021-03-21 DIAGNOSIS — U071 COVID-19: Secondary | ICD-10-CM | POA: Diagnosis not present

## 2021-03-21 DIAGNOSIS — I87099 Postthrombotic syndrome with other complications of unspecified lower extremity: Secondary | ICD-10-CM | POA: Diagnosis not present

## 2021-03-21 LAB — POTASSIUM: Potassium: 4.5 mmol/L (ref 3.5–5.1)

## 2021-03-21 NOTE — Progress Notes (Signed)
Pulmonary Critical Care Medicine [facility] [place]   PULMONARY CRITICAL CARE SERVICE  PROGRESS NOTE     [name]  BOF:[id number]  DOB: 1956/03/02   DOA: 12/27/2020  Referring Physician: [name], MD  HPI: Scott Roberts is a 65 y.o. adult seen for follow up of Acute on Chronic Respiratory Failure.  The patient is resting comfortably right now without distress at this time.  Medications: Reviewed on Rounds  Physical Exam:  Vitals: Temperature is 98.0 pulse 96 respiratory rate is 26 blood pressure is 127/70 saturations 100%  Ventilator Settings off the ventilator on T collar on 40% FiO2  . General: Comfortable at this time . Eyes: Grossly normal lids, irises & conjunctiva . ENT: grossly tongue is normal . Neck: no obvious mass . Cardiovascular: S1 S2 normal no gallop . Respiratory: Scattered rhonchi expansion is equal . Abdomen: soft . Skin: no rash seen on limited exam . Musculoskeletal: not rigid . Psychiatric:unable to assess . Neurologic: no seizure no involuntary movements         Lab Data:   Basic Metabolic Panel: Recent Labs  Lab 03/15/21 0405 03/18/21 0441 03/19/21 0502 03/20/21 0617 03/21/21 0854  NA 143 145  --  145  --   K 4.2 3.0* 3.7 3.4* 4.5  CL 103 104  --  104  --   CO2 32 34*  --  36*  --   GLUCOSE 132* 148*  --  133*  --   BUN 53* 41*  --  42*  --   CREATININE 0.74 0.64  --  0.63  --   CALCIUM 9.9 10.1  --  10.2  --   MG 2.2 1.9  --  2.1  --   PHOS  --   --   --  2.9  --     ABG: No results for input(s): PHART, PCO2ART, PO2ART, HCO3, O2SAT in the last 168 hours.  Liver Function Tests: No results for input(s): AST, ALT, ALKPHOS, BILITOT, PROT, ALBUMIN in the last 168 hours. No results for input(s): LIPASE, AMYLASE in the last 168 hours. No results for input(s): AMMONIA in the last 168 hours.  CBC: Recent Labs  Lab 03/15/21 0405 03/18/21 0441 03/20/21 0617  WBC 12.8* 10.8* 12.0*  HGB 10.7* 10.5* 9.7*  HCT  33.8* 33.6* 31.2*  MCV 94.7 96.3 95.7  PLT 320 275 295    Cardiac Enzymes: No results for input(s): CKTOTAL, CKMB, CKMBINDEX, TROPONINI in the last 168 hours.  BNP (last 3 results) Recent Labs    02/08/21 2018  BNP 179.5*    ProBNP (last 3 results) No results for input(s): PROBNP in the last 8760 hours.  Radiological Exams: DG CHEST PORT 1 VIEW  Result Date: 03/20/2021 CLINICAL DATA:  Pneumonia. EXAM: PORTABLE CHEST 1 VIEW COMPARISON:  03/15/2021. FINDINGS: Tracheostomy tube noted in stable position. Cardiomegaly. Diffuse bilateral pulmonary infiltrates/edema and small bilateral pleural effusions again noted without interim change. Left lower lobe atelectasis/consolidation noted. No pneumothorax. IMPRESSION: 1. Tracheostomy tube in stable position. 2. Stable cardiomegaly. 3. Diffuse bilateral pulmonary infiltrates/edema and small bilateral pleural effusions again noted without interim change. Left lower lobe atelectasis/consolidation noted. Electronically Signed   By: Maisie Fus  Register   On: 03/20/2021 06:05    Assessment/Plan Active Problems:   Acute on chronic respiratory failure with hypoxia (HCC)   COVID-19 virus infection   Acute respiratory distress syndrome (ARDS) due to COVID-19 virus [facility])   Healthcare associated bacterial pneumonia   Chronic venous hypertension  due to DVT, unspecified laterality   1. Acute on chronic respiratory failure with hypoxia we will continue with T collar on 35% FiO2.  The goal is to have patient do PMV also speech therapy will evaluate the patient.  Will check continue to advance 2. COVID-19 virus infection recovery 3. ARDS treated slowly improving 4. Healthcare associated pneumonia treated 5. Chronic DVT supportive care   I have personally seen and evaluated the patient, evaluated laboratory and imaging results, formulated the assessment and plan and placed orders. The Patient requires high complexity decision making with multiple systems  involvement.  Rounds were done with the Respiratory Therapy Director and Staff therapists and discussed with nursing staff also.  Yevonne Pax, MD [facility] Pulmonary Critical Care Medicine Sleep Medicine

## 2021-03-22 ENCOUNTER — Other Ambulatory Visit (HOSPITAL_COMMUNITY): Payer: Federal, State, Local not specified - PPO

## 2021-03-22 DIAGNOSIS — I87099 Postthrombotic syndrome with other complications of unspecified lower extremity: Secondary | ICD-10-CM | POA: Diagnosis not present

## 2021-03-22 DIAGNOSIS — J9621 Acute and chronic respiratory failure with hypoxia: Secondary | ICD-10-CM | POA: Diagnosis not present

## 2021-03-22 DIAGNOSIS — J159 Unspecified bacterial pneumonia: Secondary | ICD-10-CM | POA: Diagnosis not present

## 2021-03-22 DIAGNOSIS — U071 COVID-19: Secondary | ICD-10-CM | POA: Diagnosis not present

## 2021-03-22 LAB — CBC
HCT: 32.2 % — ABNORMAL LOW (ref 39.0–52.0)
Hemoglobin: 9.9 g/dL — ABNORMAL LOW (ref 13.0–17.0)
MCH: 29.6 pg (ref 26.0–34.0)
MCHC: 30.7 g/dL (ref 30.0–36.0)
MCV: 96.1 fL (ref 80.0–100.0)
Platelets: 298 10*3/uL (ref 150–400)
RBC: 3.35 MIL/uL — ABNORMAL LOW (ref 4.22–5.81)
RDW: 17 % — ABNORMAL HIGH (ref 11.5–15.5)
WBC: 9.2 10*3/uL (ref 4.0–10.5)
nRBC: 0.2 % (ref 0.0–0.2)

## 2021-03-22 LAB — BASIC METABOLIC PANEL
Anion gap: 8 (ref 5–15)
BUN: 37 mg/dL — ABNORMAL HIGH (ref 8–23)
CO2: 31 mmol/L (ref 22–32)
Calcium: 9.8 mg/dL (ref 8.9–10.3)
Chloride: 110 mmol/L (ref 98–111)
Creatinine, Ser: 0.68 mg/dL (ref 0.61–1.24)
GFR, Estimated: >60 mL/min (ref >60)
Glucose, Bld: 106 mg/dL — ABNORMAL HIGH (ref 70–99)
Potassium: 3.9 mmol/L (ref 3.5–5.1)
Sodium: 149 mmol/L — ABNORMAL HIGH (ref 135–145)

## 2021-03-22 LAB — PHOSPHORUS: Phosphorus: 3.3 mg/dL (ref 2.5–4.6)

## 2021-03-22 LAB — MAGNESIUM: Magnesium: 2.2 mg/dL (ref 1.7–2.4)

## 2021-03-22 NOTE — Progress Notes (Signed)
Pulmonary Critical Care Medicine [facility] [place]   PULMONARY CRITICAL CARE SERVICE  PROGRESS NOTE     Scott Roberts  [id number]:[id number]  DOB: 16-May-1956   DOA: 12/27/2020  Referring Physician: [name], MD  HPI: Scott Roberts is a 65 y.o. adult seen for follow up of Acute on Chronic Respiratory Failure.  Currently is off the ventilator on T collar has been on 40% FiO2.  Medications: Reviewed on Rounds  Physical Exam:  Vitals: Temperature is 97.9 pulse 71 respiratory 24 blood pressure is 120/75 saturations 95%.  Ventilator Settings on T-piece FiO2 40%  . General: Comfortable at this time . Eyes: Grossly normal lids, irises & conjunctiva . ENT: grossly tongue is normal . Neck: no obvious mass . Cardiovascular: S1 S2 normal no gallop . Respiratory: No rhonchi very coarse breath sounds noted . Abdomen: soft . Skin: no rash seen on limited exam . Musculoskeletal: not rigid . Psychiatric:unable to assess . Neurologic: no seizure no involuntary movements         Lab Data:   Basic Metabolic Panel: Recent Labs  Lab 03/18/21 0441 03/19/21 0502 03/20/21 0617 03/21/21 0854 03/22/21 0449  NA 145  --  145  --  149*  K 3.0* 3.7 3.4* 4.5 3.9  CL 104  --  104  --  110  CO2 34*  --  36*  --  31  GLUCOSE 148*  --  133*  --  106*  BUN 41*  --  42*  --  37*  CREATININE 0.64  --  0.63  --  0.68  CALCIUM 10.1  --  10.2  --  9.8  MG 1.9  --  2.1  --  2.2  PHOS  --   --  2.9  --  3.3    ABG: No results for input(s): PHART, PCO2ART, PO2ART, HCO3, O2SAT in the last 168 hours.  Liver Function Tests: No results for input(s): AST, ALT, ALKPHOS, BILITOT, PROT, ALBUMIN in the last 168 hours. No results for input(s): LIPASE, AMYLASE in the last 168 hours. No results for input(s): AMMONIA in the last 168 hours.  CBC: Recent Labs  Lab 03/18/21 0441 03/20/21 0617 03/22/21 0449  WBC 10.8* 12.0* 9.2  HGB 10.5* 9.7* 9.9*  HCT 33.6* 31.2* 32.2*  MCV 96.3 95.7 96.1   PLT 275 295 298    Cardiac Enzymes: No results for input(s): CKTOTAL, CKMB, CKMBINDEX, TROPONINI in the last 168 hours.  BNP (last 3 results) Recent Labs    02/08/21 2018  BNP 179.5*    ProBNP (last 3 results) No results for input(s): PROBNP in the last 8760 hours.  Radiological Exams: DG CHEST PORT 1 VIEW  Result Date: 03/22/2021 CLINICAL DATA:  Pleural effusion.  Pneumonia. EXAM: PORTABLE CHEST 1 VIEW COMPARISON:  03/20/2021. FINDINGS: Tracheostomy tube in stable position. Persistent cardiomegaly. Persistent diffuse bilateral pulmonary infiltrates/edema. Left pleural effusion again noted unchanged. New small right pleural effusion noted. Persistent left lower lobe atelectasis/consolidation. No pneumothorax. IMPRESSION: 1.  Tracheostomy tube in stable position. 2. Persistent cardiomegaly with diffuse bilateral pulmonary infiltrates/edema. Left pleural effusion again noted unchanged. New small right pleural effusion. 3.  Persistent left lower lobe atelectasis/consolidation. 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 Acute on chronic respiratory failure hypoxia we will continue with T-piece titrate oxygen continue pulmonary toilet. 2. COVID-19 virus infection at baseline we will continue to follow 3. ARDS treated slowly improving. 4. Healthcare associated pneumonia treated 5. 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 The Patient requires high complexity decision making with multiple systems involvement.  Rounds  were done with the Respiratory Therapy Director and Staff therapists and discussed with nursing staff also.  Yevonne Pax, MD [facility] Pulmonary Critical Care Medicine Sleep Medicine

## 2021-03-23 LAB — BASIC METABOLIC PANEL
Anion gap: 6 (ref 5–15)
BUN: 39 mg/dL — ABNORMAL HIGH (ref 8–23)
CO2: 32 mmol/L (ref 22–32)
Calcium: 9.4 mg/dL (ref 8.9–10.3)
Chloride: 109 mmol/L (ref 98–111)
Creatinine, Ser: 0.7 mg/dL (ref 0.61–1.24)
GFR, Estimated: >60 mL/min (ref >60)
Glucose, Bld: 133 mg/dL — ABNORMAL HIGH (ref 70–99)
Potassium: 4.5 mmol/L (ref 3.5–5.1)
Sodium: 147 mmol/L — ABNORMAL HIGH (ref 135–145)

## 2021-03-24 LAB — BASIC METABOLIC PANEL
Anion gap: 7 (ref 5–15)
BUN: 34 mg/dL — ABNORMAL HIGH (ref 8–23)
CO2: 31 mmol/L (ref 22–32)
Calcium: 9.3 mg/dL (ref 8.9–10.3)
Chloride: 105 mmol/L (ref 98–111)
Creatinine, Ser: 0.66 mg/dL (ref 0.61–1.24)
GFR, Estimated: >60 mL/min (ref >60)
Glucose, Bld: 125 mg/dL — ABNORMAL HIGH (ref 70–99)
Potassium: 3.7 mmol/L (ref 3.5–5.1)
Sodium: 143 mmol/L (ref 135–145)

## 2021-03-24 LAB — CBC
HCT: 31.9 % — ABNORMAL LOW (ref 39.0–52.0)
Hemoglobin: 9.7 g/dL — ABNORMAL LOW (ref 13.0–17.0)
MCH: 29.1 pg (ref 26.0–34.0)
MCHC: 30.4 g/dL (ref 30.0–36.0)
MCV: 95.8 fL (ref 80.0–100.0)
Platelets: 313 10*3/uL (ref 150–400)
RBC: 3.33 MIL/uL — ABNORMAL LOW (ref 4.22–5.81)
RDW: 17.2 % — ABNORMAL HIGH (ref 11.5–15.5)
WBC: 9.3 10*3/uL (ref 4.0–10.5)
nRBC: 0 % (ref 0.0–0.2)

## 2021-03-24 LAB — MAGNESIUM: Magnesium: 2.2 mg/dL (ref 1.7–2.4)

## 2021-03-24 LAB — PHOSPHORUS: Phosphorus: 3.2 mg/dL (ref 2.5–4.6)

## 2021-03-25 ENCOUNTER — Other Ambulatory Visit (HOSPITAL_COMMUNITY): Payer: Federal, State, Local not specified - PPO

## 2021-03-26 LAB — BASIC METABOLIC PANEL
Anion gap: 9 (ref 5–15)
BUN: 33 mg/dL — ABNORMAL HIGH (ref 8–23)
CO2: 33 mmol/L — ABNORMAL HIGH (ref 22–32)
Calcium: 9.8 mg/dL (ref 8.9–10.3)
Chloride: 106 mmol/L (ref 98–111)
Creatinine, Ser: 0.63 mg/dL (ref 0.61–1.24)
GFR, Estimated: >60 mL/min (ref >60)
Glucose, Bld: 135 mg/dL — ABNORMAL HIGH (ref 70–99)
Potassium: 3.9 mmol/L (ref 3.5–5.1)
Sodium: 148 mmol/L — ABNORMAL HIGH (ref 135–145)

## 2021-03-26 LAB — CBC
HCT: 33.9 % — ABNORMAL LOW (ref 39.0–52.0)
Hemoglobin: 10.3 g/dL — ABNORMAL LOW (ref 13.0–17.0)
MCH: 29.5 pg (ref 26.0–34.0)
MCHC: 30.4 g/dL (ref 30.0–36.0)
MCV: 97.1 fL (ref 80.0–100.0)
Platelets: 363 10*3/uL (ref 150–400)
RBC: 3.49 MIL/uL — ABNORMAL LOW (ref 4.22–5.81)
RDW: 17.2 % — ABNORMAL HIGH (ref 11.5–15.5)
WBC: 12.1 10*3/uL — ABNORMAL HIGH (ref 4.0–10.5)
nRBC: 0 % (ref 0.0–0.2)

## 2021-03-26 LAB — MAGNESIUM: Magnesium: 2.4 mg/dL (ref 1.7–2.4)

## 2021-03-26 LAB — PHOSPHORUS: Phosphorus: 3.8 mg/dL (ref 2.5–4.6)

## 2021-03-27 LAB — BASIC METABOLIC PANEL
Anion gap: 7 (ref 5–15)
BUN: 29 mg/dL — ABNORMAL HIGH (ref 8–23)
CO2: 31 mmol/L (ref 22–32)
Calcium: 9.1 mg/dL (ref 8.9–10.3)
Chloride: 106 mmol/L (ref 98–111)
Creatinine, Ser: 0.7 mg/dL (ref 0.61–1.24)
GFR, Estimated: >60 mL/min (ref >60)
Glucose, Bld: 124 mg/dL — ABNORMAL HIGH (ref 70–99)
Potassium: 4.6 mmol/L (ref 3.5–5.1)
Sodium: 144 mmol/L (ref 135–145)

## 2021-03-30 LAB — MAGNESIUM: Magnesium: 1.9 mg/dL (ref 1.7–2.4)

## 2021-03-30 LAB — CBC
HCT: 33.4 % — ABNORMAL LOW (ref 39.0–52.0)
Hemoglobin: 10.5 g/dL — ABNORMAL LOW (ref 13.0–17.0)
MCH: 29.7 pg (ref 26.0–34.0)
MCHC: 31.4 g/dL (ref 30.0–36.0)
MCV: 94.6 fL (ref 80.0–100.0)
Platelets: 348 10*3/uL (ref 150–400)
RBC: 3.53 MIL/uL — ABNORMAL LOW (ref 4.22–5.81)
RDW: 16.9 % — ABNORMAL HIGH (ref 11.5–15.5)
WBC: 9.9 10*3/uL (ref 4.0–10.5)
nRBC: 0 % (ref 0.0–0.2)

## 2021-03-30 LAB — BASIC METABOLIC PANEL
Anion gap: 6 (ref 5–15)
BUN: 29 mg/dL — ABNORMAL HIGH (ref 8–23)
CO2: 29 mmol/L (ref 22–32)
Calcium: 9.4 mg/dL (ref 8.9–10.3)
Chloride: 104 mmol/L (ref 98–111)
Creatinine, Ser: 0.77 mg/dL (ref 0.61–1.24)
GFR, Estimated: >60 mL/min (ref >60)
Glucose, Bld: 118 mg/dL — ABNORMAL HIGH (ref 70–99)
Potassium: 4.2 mmol/L (ref 3.5–5.1)
Sodium: 139 mmol/L (ref 135–145)

## 2021-03-30 LAB — TSH: TSH: 2.237 u[IU]/mL (ref 0.350–4.500)

## 2021-03-31 ENCOUNTER — Other Ambulatory Visit (HOSPITAL_COMMUNITY): Payer: Federal, State, Local not specified - PPO

## 2021-03-31 LAB — URINALYSIS, ROUTINE W REFLEX MICROSCOPIC
Bilirubin Urine: NEGATIVE
Glucose, UA: NEGATIVE mg/dL
Hgb urine dipstick: NEGATIVE
Ketones, ur: NEGATIVE mg/dL
Leukocytes,Ua: NEGATIVE
Nitrite: NEGATIVE
Protein, ur: NEGATIVE mg/dL
Specific Gravity, Urine: 1.012 (ref 1.005–1.030)
pH: 8 (ref 5.0–8.0)

## 2021-04-01 LAB — C DIFFICILE (CDIFF) QUICK SCRN (NO PCR REFLEX)
C Diff antigen: NEGATIVE
C Diff interpretation: NOT DETECTED
C Diff toxin: NEGATIVE

## 2021-04-01 LAB — URINE CULTURE: Culture: NO GROWTH

## 2021-04-01 LAB — EXPECTORATED SPUTUM ASSESSMENT W GRAM STAIN, RFLX TO RESP C

## 2021-04-02 DIAGNOSIS — U071 COVID-19: Secondary | ICD-10-CM | POA: Diagnosis not present

## 2021-04-02 DIAGNOSIS — J9621 Acute and chronic respiratory failure with hypoxia: Secondary | ICD-10-CM | POA: Diagnosis not present

## 2021-04-02 DIAGNOSIS — J80 Acute respiratory distress syndrome: Secondary | ICD-10-CM | POA: Diagnosis not present

## 2021-04-02 DIAGNOSIS — I87099 Postthrombotic syndrome with other complications of unspecified lower extremity: Secondary | ICD-10-CM | POA: Diagnosis not present

## 2021-04-02 LAB — BASIC METABOLIC PANEL
Anion gap: 7 (ref 5–15)
BUN: 24 mg/dL — ABNORMAL HIGH (ref 8–23)
CO2: 30 mmol/L (ref 22–32)
Calcium: 9.2 mg/dL (ref 8.9–10.3)
Chloride: 105 mmol/L (ref 98–111)
Creatinine, Ser: 0.81 mg/dL (ref 0.61–1.24)
GFR, Estimated: >60 mL/min (ref >60)
Glucose, Bld: 99 mg/dL (ref 70–99)
Potassium: 3.8 mmol/L (ref 3.5–5.1)
Sodium: 142 mmol/L (ref 135–145)

## 2021-04-02 LAB — CBC
HCT: 30.7 % — ABNORMAL LOW (ref 39.0–52.0)
Hemoglobin: 9.4 g/dL — ABNORMAL LOW (ref 13.0–17.0)
MCH: 28.9 pg (ref 26.0–34.0)
MCHC: 30.6 g/dL (ref 30.0–36.0)
MCV: 94.5 fL (ref 80.0–100.0)
Platelets: 297 10*3/uL (ref 150–400)
RBC: 3.25 MIL/uL — ABNORMAL LOW (ref 4.22–5.81)
RDW: 16.4 % — ABNORMAL HIGH (ref 11.5–15.5)
WBC: 7.1 10*3/uL (ref 4.0–10.5)
nRBC: 0 % (ref 0.0–0.2)

## 2021-04-02 LAB — MAGNESIUM: Magnesium: 1.9 mg/dL (ref 1.7–2.4)

## 2021-04-02 NOTE — Progress Notes (Signed)
Pulmonary Critical Care Medicine [facility] [place]   PULMONARY CRITICAL CARE SERVICE  PROGRESS NOTE     ESMERALDA BLANFORD  DXI:[id number]  DOB: [birth year]   DOA: 12/27/2020  Referring Physician: [name], MD  HPI: Scott Roberts is a 65 y.o. adult seen for follow up of Acute on Chronic Respiratory Failure.  Patient is capping doing fairly well possible discharge today  Medications: Reviewed on Rounds  Physical Exam:  Vitals: Temperature is 96.8 pulse 90 respiratory rate is 30 blood pressure is 126/72 saturations 95%  Ventilator Settings capping off the ventilator  . General: Comfortable at this time . Eyes: Grossly normal lids, irises & conjunctiva . ENT: grossly tongue is normal . Neck: no obvious mass . Cardiovascular: S1 S2 normal no gallop . Respiratory: No rhonchi very coarse breath sounds . Abdomen: soft . Skin: no rash seen on limited exam . Musculoskeletal: not rigid . Psychiatric:unable to assess . Neurologic: no seizure no involuntary movements         Lab Data:   Basic Metabolic Panel: Recent Labs  Lab 03/27/21 0458 03/30/21 1633 04/02/21 0500  NA 144 139 142  K 4.6 4.2 3.8  CL 106 104 105  CO2 31 29 30   GLUCOSE 124* 118* 99  BUN 29* 29* 24*  CREATININE 0.70 0.77 0.81  CALCIUM 9.1 9.4 9.2  MG  --  1.9 1.9    ABG: No results for input(s): PHART, PCO2ART, PO2ART, HCO3, O2SAT in the last 168 hours.  Liver Function Tests: No results for input(s): AST, ALT, ALKPHOS, BILITOT, PROT, ALBUMIN in the last 168 hours. No results for input(s): LIPASE, AMYLASE in the last 168 hours. No results for input(s): AMMONIA in the last 168 hours.  CBC: Recent Labs  Lab 03/30/21 1633 04/02/21 0500  WBC 9.9 7.1  HGB 10.5* 9.4*  HCT 33.4* 30.7*  MCV 94.6 94.5  PLT 348 297    Cardiac Enzymes: No results for input(s): CKTOTAL, CKMB, CKMBINDEX, TROPONINI in the last 168 hours.  BNP (last 3 results) Recent Labs    02/08/21 2018  BNP 179.5*     ProBNP (last 3 results) No results for input(s): PROBNP in the last 8760 hours.  Radiological Exams: DG CHEST PORT 1 VIEW  Result Date: 03/31/2021 CLINICAL DATA:  Shortness of breath EXAM: PORTABLE CHEST 1 VIEW COMPARISON:  March 25, 2021 FINDINGS: Tracheostomy catheter tip is 3.4 cm above the carina. No pneumothorax. There is cardiomegaly with pulmonary venous hypertension. There are multiple foci of airspace opacity bilaterally with suspected small pleural effusions on each side. No evident adenopathy. Old healed fracture right clavicle. IMPRESSION: Tracheostomy as described. Cardiomegaly with pulmonary vascular congestion. Probable small pleural effusions bilaterally with multiple areas of airspace opacity, somewhat more on the left than on the right. Question multifocal pneumonia versus pulmonary edema; both entities may be present concurrently. No pneumothorax evident on this study. Electronically Signed   By: March 27, 2021 III M.D.   On: 03/31/2021 10:57    Assessment/Plan Active Problems:   Acute on chronic respiratory failure with hypoxia (HCC)   COVID-19 virus infection   Acute respiratory distress syndrome (ARDS) due to COVID-19 virus [facility])   Healthcare associated bacterial pneumonia   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 Acute on chronic respiratory failure with hypoxia patient is doing fine with the capping trials now plan is going to be to advance the weaning. 2. COVID-19 virus infection in recovery 3. ARDS treated we will  continue to monitor 4. Healthcare associated pneumonia treated slow improvement 5. Chronic DVT supportive care   I have personally seen and evaluated the patient, evaluated laboratory and imaging results, formulated the assessment and plan and placed orders. The Patient requires high complexity decision making with multiple systems involvement.  Rounds were done with the Respiratory Therapy Director and Staff therapists  and discussed with nursing staff also.  Yevonne Pax, MD [facility] Pulmonary Critical Care Medicine Sleep Medicine

## 2021-04-03 ENCOUNTER — Other Ambulatory Visit (HOSPITAL_COMMUNITY): Payer: Federal, State, Local not specified - PPO

## 2021-04-03 DIAGNOSIS — I87099 Postthrombotic syndrome with other complications of unspecified lower extremity: Secondary | ICD-10-CM | POA: Diagnosis not present

## 2021-04-03 DIAGNOSIS — U071 COVID-19: Secondary | ICD-10-CM | POA: Diagnosis not present

## 2021-04-03 DIAGNOSIS — J80 Acute respiratory distress syndrome: Secondary | ICD-10-CM | POA: Diagnosis not present

## 2021-04-03 DIAGNOSIS — J9621 Acute and chronic respiratory failure with hypoxia: Secondary | ICD-10-CM | POA: Diagnosis not present

## 2021-04-03 LAB — CBC
HCT: 31.8 % — ABNORMAL LOW (ref 39.0–52.0)
Hemoglobin: 9.9 g/dL — ABNORMAL LOW (ref 13.0–17.0)
MCH: 29.4 pg (ref 26.0–34.0)
MCHC: 31.1 g/dL (ref 30.0–36.0)
MCV: 94.4 fL (ref 80.0–100.0)
Platelets: 312 10*3/uL (ref 150–400)
RBC: 3.37 MIL/uL — ABNORMAL LOW (ref 4.22–5.81)
RDW: 16.1 % — ABNORMAL HIGH (ref 11.5–15.5)
WBC: 8.6 10*3/uL (ref 4.0–10.5)
nRBC: 0 % (ref 0.0–0.2)

## 2021-04-03 LAB — CULTURE, RESPIRATORY W GRAM STAIN: Gram Stain: NONE SEEN

## 2021-04-03 LAB — BASIC METABOLIC PANEL
Anion gap: 5 (ref 5–15)
BUN: 16 mg/dL (ref 8–23)
CO2: 31 mmol/L (ref 22–32)
Calcium: 9.2 mg/dL (ref 8.9–10.3)
Chloride: 104 mmol/L (ref 98–111)
Creatinine, Ser: 0.82 mg/dL (ref 0.61–1.24)
GFR, Estimated: >60 mL/min (ref >60)
Glucose, Bld: 111 mg/dL — ABNORMAL HIGH (ref 70–99)
Potassium: 3.7 mmol/L (ref 3.5–5.1)
Sodium: 140 mmol/L (ref 135–145)

## 2021-04-03 LAB — MAGNESIUM: Magnesium: 1.9 mg/dL (ref 1.7–2.4)

## 2021-04-03 LAB — PHOSPHORUS: Phosphorus: 4.2 mg/dL (ref 2.5–4.6)

## 2021-04-03 NOTE — Progress Notes (Signed)
Pulmonary Critical Care Medicine [facility] [place]   PULMONARY CRITICAL CARE SERVICE  PROGRESS NOTE     Scott Roberts  [id number]:[id number]  DOB: 03-Dec-1956   DOA: 12/27/2020  Referring Physician: [name], MD  HPI: Scott Roberts is a 65 y.o. adult seen for follow up of Acute on Chronic Respiratory Failure.  Patient is awaiting discharge remains capping right now has been on 4 L  Medications: Reviewed on Rounds  Physical Exam:  Vitals: Temperature is 97.2 pulse 95 respiratory rate is 23 blood pressure is 121/67 saturations 94%  Ventilator Settings capping on 4 L  . General: Comfortable at this time . Eyes: Grossly normal lids, irises & conjunctiva . ENT: grossly tongue is normal . Neck: no obvious mass . Cardiovascular: S1 S2 normal no gallop . Respiratory: Scattered rhonchi expansion is equal . Abdomen: soft . Skin: no rash seen on limited exam . Musculoskeletal: not rigid . Psychiatric:unable to assess . Neurologic: no seizure no involuntary movements         Lab Data:   Basic Metabolic Panel: Recent Labs  Lab 03/30/21 1633 04/02/21 0500 04/03/21 0537  NA 139 142 140  K 4.2 3.8 3.7  CL 104 105 104  CO2 29 30 31   GLUCOSE 118* 99 111*  BUN 29* 24* 16  CREATININE 0.77 0.81 0.82  CALCIUM 9.4 9.2 9.2  MG 1.9 1.9 1.9  PHOS  --   --  4.2    ABG: No results for input(s): PHART, PCO2ART, PO2ART, HCO3, O2SAT in the last 168 hours.  Liver Function Tests: No results for input(s): AST, ALT, ALKPHOS, BILITOT, PROT, ALBUMIN in the last 168 hours. No results for input(s): LIPASE, AMYLASE in the last 168 hours. No results for input(s): AMMONIA in the last 168 hours.  CBC: Recent Labs  Lab 03/30/21 1633 04/02/21 0500 04/03/21 0537  WBC 9.9 7.1 8.6  HGB 10.5* 9.4* 9.9*  HCT 33.4* 30.7* 31.8*  MCV 94.6 94.5 94.4  PLT 348 297 312    Cardiac Enzymes: No results for input(s): CKTOTAL, CKMB, CKMBINDEX, TROPONINI in the last 168 hours.  BNP (last 3  results) Recent Labs    02/08/21 2018  BNP 179.5*    ProBNP (last 3 results) No results for input(s): PROBNP in the last 8760 hours.  Radiological Exams: DG CHEST PORT 1 VIEW  Result Date: 04/03/2021 CLINICAL DATA:  65 year old male with shortness of breath, pneumonia. EXAM: PORTABLE CHEST 1 VIEW COMPARISON:  Portable chest 03/31/2021 and earlier. FINDINGS: Portable AP semi upright view at 0611 hours. Stable tracheostomy. Stable cardiac size and mediastinal contours. Mildly improved lung volumes. Dense retrocardiac opacity persists. Veiling opacity in both lungs. No pneumothorax. Pulmonary vasculature remains indistinct. No air bronchograms identified. Negative visible bowel gas. Stable visualized osseous structures. Chronic right clavicle deformity. IMPRESSION: Ventilation not significantly changed since 03/31/2021. Pulmonary edema with bilateral lower lobe collapse and bilateral pleural effusions is favored over a bilateral pneumonia. Electronically Signed   By: 05/31/2021 M.D.   On: 04/03/2021 07:17    Assessment/Plan Active Problems:   Acute on chronic respiratory failure with hypoxia (HCC)   COVID-19 virus infection   Acute respiratory distress syndrome (ARDS) due to COVID-19 virus [facility])   Healthcare associated bacterial pneumonia   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 Acute on chronic respiratory failure hypoxia we will continue with capping trials on 4 L of oxygen. 2. COVID-19 virus infection recovery 3. ARDS treated slow improvement 4.  Healthcare associated pneumonia treated slowly improved 5. 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 The Patient requires high complexity decision making with multiple systems involvement.  Rounds were done with the Respiratory Therapy Director and Staff therapists and  discussed with nursing staff also.  Yevonne Pax, MD [facility] Pulmonary Critical Care Medicine Sleep Medicine

## 2021-04-04 DIAGNOSIS — J80 Acute respiratory distress syndrome: Secondary | ICD-10-CM | POA: Diagnosis not present

## 2021-04-04 DIAGNOSIS — J9621 Acute and chronic respiratory failure with hypoxia: Secondary | ICD-10-CM | POA: Diagnosis not present

## 2021-04-04 DIAGNOSIS — I87099 Postthrombotic syndrome with other complications of unspecified lower extremity: Secondary | ICD-10-CM | POA: Diagnosis not present

## 2021-04-04 DIAGNOSIS — U071 COVID-19: Secondary | ICD-10-CM | POA: Diagnosis not present

## 2021-04-04 NOTE — Progress Notes (Signed)
Pulmonary Critical Care Medicine [facility] [place]   PULMONARY CRITICAL CARE SERVICE  PROGRESS NOTE     Scott Roberts  JAS:[id number]  DOB: 24-Nov-1956   DOA: 12/27/2020  Referring Physician: [name], MD  HPI: Scott Roberts is a 65 y.o. adult seen for follow up of Acute on Chronic Respiratory Failure.  At this time patient is doing well with capping ready for decannulation  Medications: Reviewed on Rounds  Physical Exam:  Vitals: Temperature is 97.7 pulse 99 respiratory 25 blood pressure is 129/84  Ventilator Settings capping off the vent  . General: Comfortable at this time . Eyes: Grossly normal lids, irises & conjunctiva . ENT: grossly tongue is normal . Neck: no obvious mass . Cardiovascular: S1 S2 normal no gallop . Respiratory: Scattered rhonchi expansion is equal . Abdomen: soft . Skin: no rash seen on limited exam . Musculoskeletal: not rigid . Psychiatric:unable to assess . Neurologic: no seizure no involuntary movements         Lab Data:   Basic Metabolic Panel: Recent Labs  Lab 03/30/21 1633 04/02/21 0500 04/03/21 0537  NA 139 142 140  K 4.2 3.8 3.7  CL 104 105 104  CO2 29 30 31   GLUCOSE 118* 99 111*  BUN 29* 24* 16  CREATININE 0.77 0.81 0.82  CALCIUM 9.4 9.2 9.2  MG 1.9 1.9 1.9  PHOS  --   --  4.2    ABG: No results for input(s): PHART, PCO2ART, PO2ART, HCO3, O2SAT in the last 168 hours.  Liver Function Tests: No results for input(s): AST, ALT, ALKPHOS, BILITOT, PROT, ALBUMIN in the last 168 hours. No results for input(s): LIPASE, AMYLASE in the last 168 hours. No results for input(s): AMMONIA in the last 168 hours.  CBC: Recent Labs  Lab 03/30/21 1633 04/02/21 0500 04/03/21 0537  WBC 9.9 7.1 8.6  HGB 10.5* 9.4* 9.9*  HCT 33.4* 30.7* 31.8*  MCV 94.6 94.5 94.4  PLT 348 297 312    Cardiac Enzymes: No results for input(s): CKTOTAL, CKMB, CKMBINDEX, TROPONINI in the last 168 hours.  BNP (last 3 results) Recent  Labs    02/08/21 2018  BNP 179.5*    ProBNP (last 3 results) No results for input(s): PROBNP in the last 8760 hours.  Radiological Exams: DG CHEST PORT 1 VIEW  Result Date: 04/03/2021 CLINICAL DATA:  65 year old male with shortness of breath, pneumonia. EXAM: PORTABLE CHEST 1 VIEW COMPARISON:  Portable chest 03/31/2021 and earlier. FINDINGS: Portable AP semi upright view at 0611 hours. Stable tracheostomy. Stable cardiac size and mediastinal contours. Mildly improved lung volumes. Dense retrocardiac opacity persists. Veiling opacity in both lungs. No pneumothorax. Pulmonary vasculature remains indistinct. No air bronchograms identified. Negative visible bowel gas. Stable visualized osseous structures. Chronic right clavicle deformity. IMPRESSION: Ventilation not significantly changed since 03/31/2021. Pulmonary edema with bilateral lower lobe collapse and bilateral pleural effusions is favored over a bilateral pneumonia. Electronically Signed   By: 05/31/2021 M.D.   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 Acute on chronic respiratory failure hypoxia we will proceed to decannulate today 2. COVID-19 virus infection in recovery 3. ARDS slow improvement we will continue to follow along 4. Healthcare associated pneumonia treated  improving 5. Chronic DVT no change   I have personally seen and evaluated the patient, evaluated laboratory and imaging results, formulated the assessment and plan and placed orders. The Patient requires high complexity decision making with multiple systems involvement.  Rounds were done with the Respiratory Therapy Director and Staff therapists and discussed with nursing staff  also.  Yevonne Pax, MD [facility] Pulmonary Critical Care Medicine Sleep Medicine

## 2021-04-05 LAB — BASIC METABOLIC PANEL
Anion gap: 7 (ref 5–15)
BUN: 20 mg/dL (ref 8–23)
CO2: 30 mmol/L (ref 22–32)
Calcium: 9.4 mg/dL (ref 8.9–10.3)
Chloride: 102 mmol/L (ref 98–111)
Creatinine, Ser: 0.84 mg/dL (ref 0.61–1.24)
GFR, Estimated: >60 mL/min (ref >60)
Glucose, Bld: 96 mg/dL (ref 70–99)
Potassium: 3.9 mmol/L (ref 3.5–5.1)
Sodium: 139 mmol/L (ref 135–145)

## 2021-04-05 LAB — CBC
HCT: 32.7 % — ABNORMAL LOW (ref 39.0–52.0)
Hemoglobin: 10 g/dL — ABNORMAL LOW (ref 13.0–17.0)
MCH: 28.4 pg (ref 26.0–34.0)
MCHC: 30.6 g/dL (ref 30.0–36.0)
MCV: 92.9 fL (ref 80.0–100.0)
Platelets: 319 10*3/uL (ref 150–400)
RBC: 3.52 MIL/uL — ABNORMAL LOW (ref 4.22–5.81)
RDW: 15.8 % — ABNORMAL HIGH (ref 11.5–15.5)
WBC: 8 10*3/uL (ref 4.0–10.5)
nRBC: 0 % (ref 0.0–0.2)

## 2021-04-05 LAB — PHOSPHORUS: Phosphorus: 3.2 mg/dL (ref 2.5–4.6)

## 2021-04-05 LAB — MAGNESIUM: Magnesium: 1.7 mg/dL (ref 1.7–2.4)

## 2022-09-11 IMAGING — DX DG CHEST 1V PORT
1 series · 1 of 1 positions shown · non-contrast
Comparison: January 04, 2021

CLINICAL DATA: Respiratory failure

EXAM:
PORTABLE CHEST 1 VIEW

[chest ap]
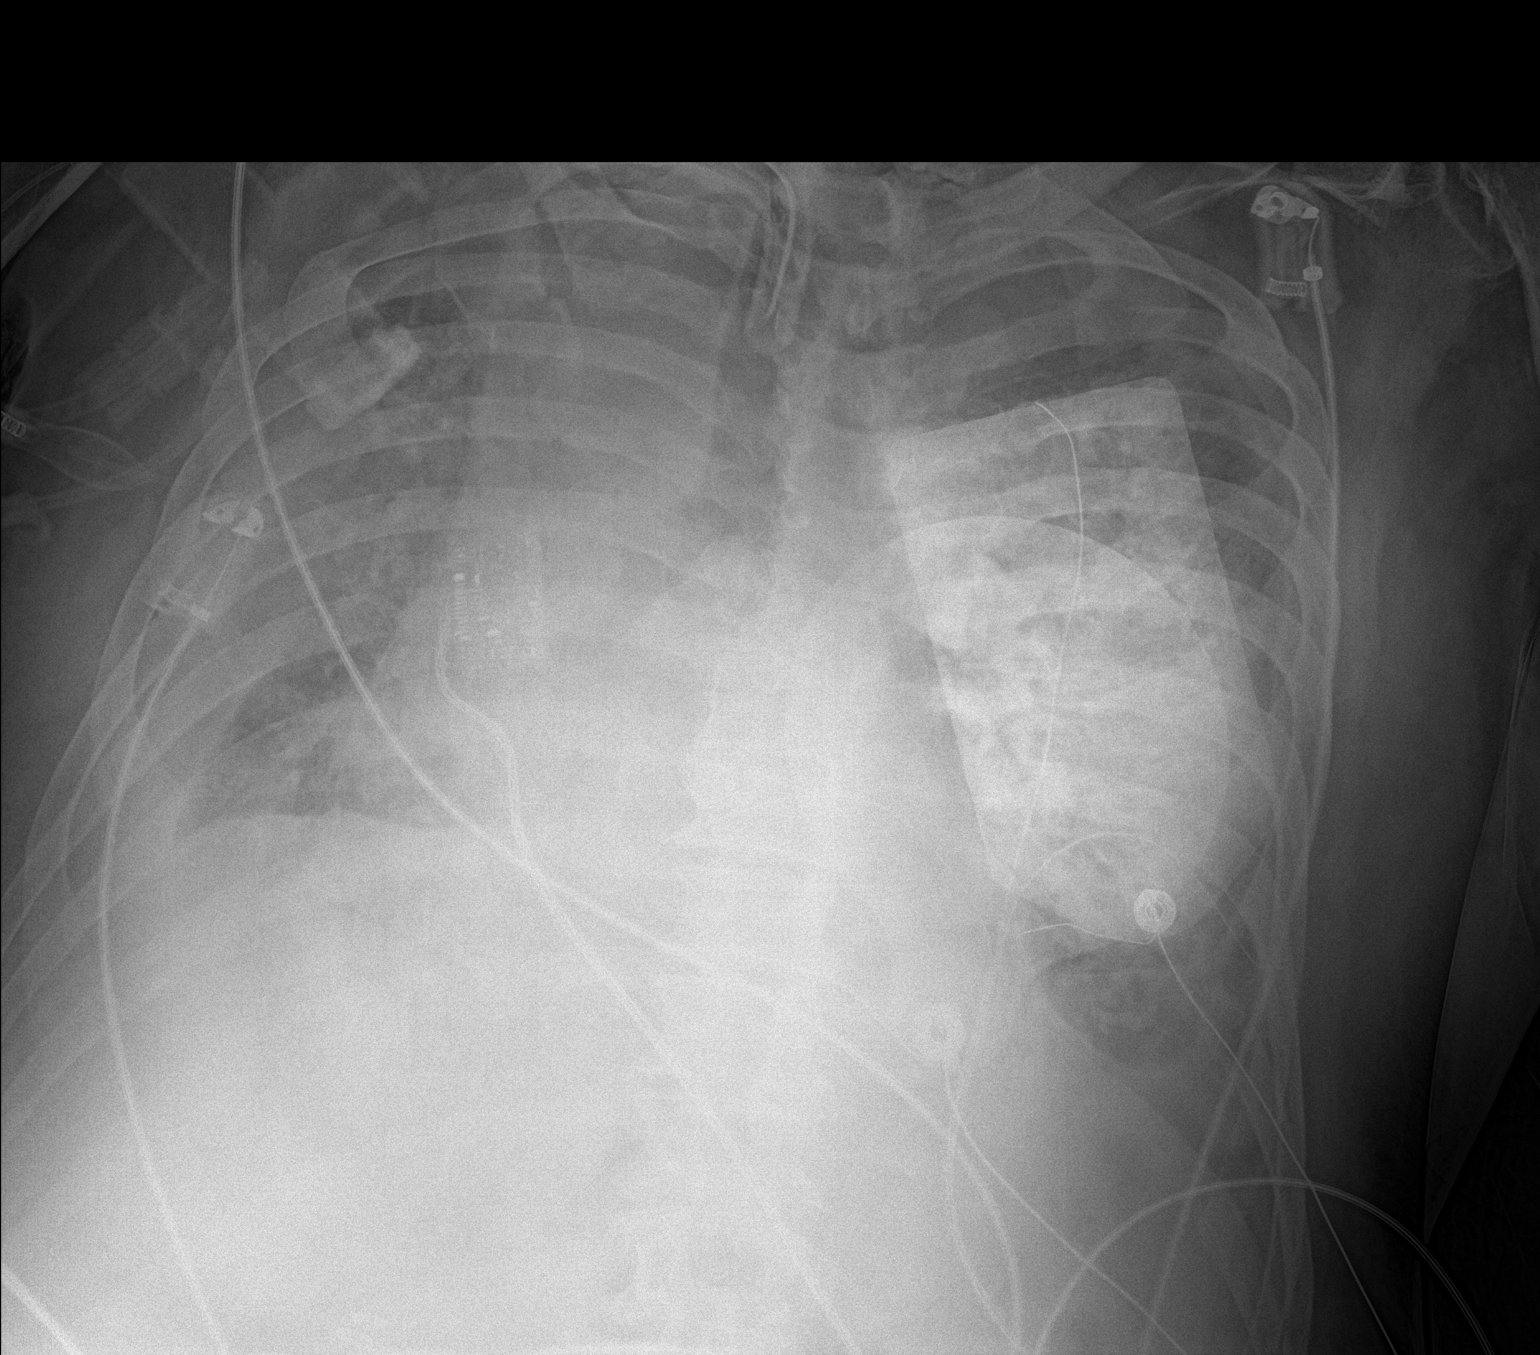

[1 of 1 positions shown; findings below may reference images not displayed]

FINDINGS: Tracheostomy catheter tip is 4.6 cm above the carina. No
pneumothorax. There are apparent pleural effusions bilaterally with
ill-defined airspace opacity throughout both lungs. Heart is upper
normal in size with pulmonary vascularity normal. No adenopathy. No
bone lesions.
IMPRESSION: Tracheostomy as described without pneumothorax. Pleural effusions
bilaterally. Widespread airspace opacity which could represent
widespread pulmonary edema and/or multifocal pneumonia. Both
entities may be present concurrently. Stable cardiac silhouette.

## 2022-09-12 IMAGING — DX DG CHEST 1V PORT
1 series · 1 of 1 positions shown · non-contrast
Comparison: Portable chest 01/08/2021 and earlier.

CLINICAL DATA: 64-year-old male with acute on chronic respiratory
failure. [id number].

EXAM:
PORTABLE CHEST 1 VIEW

[chest ap]
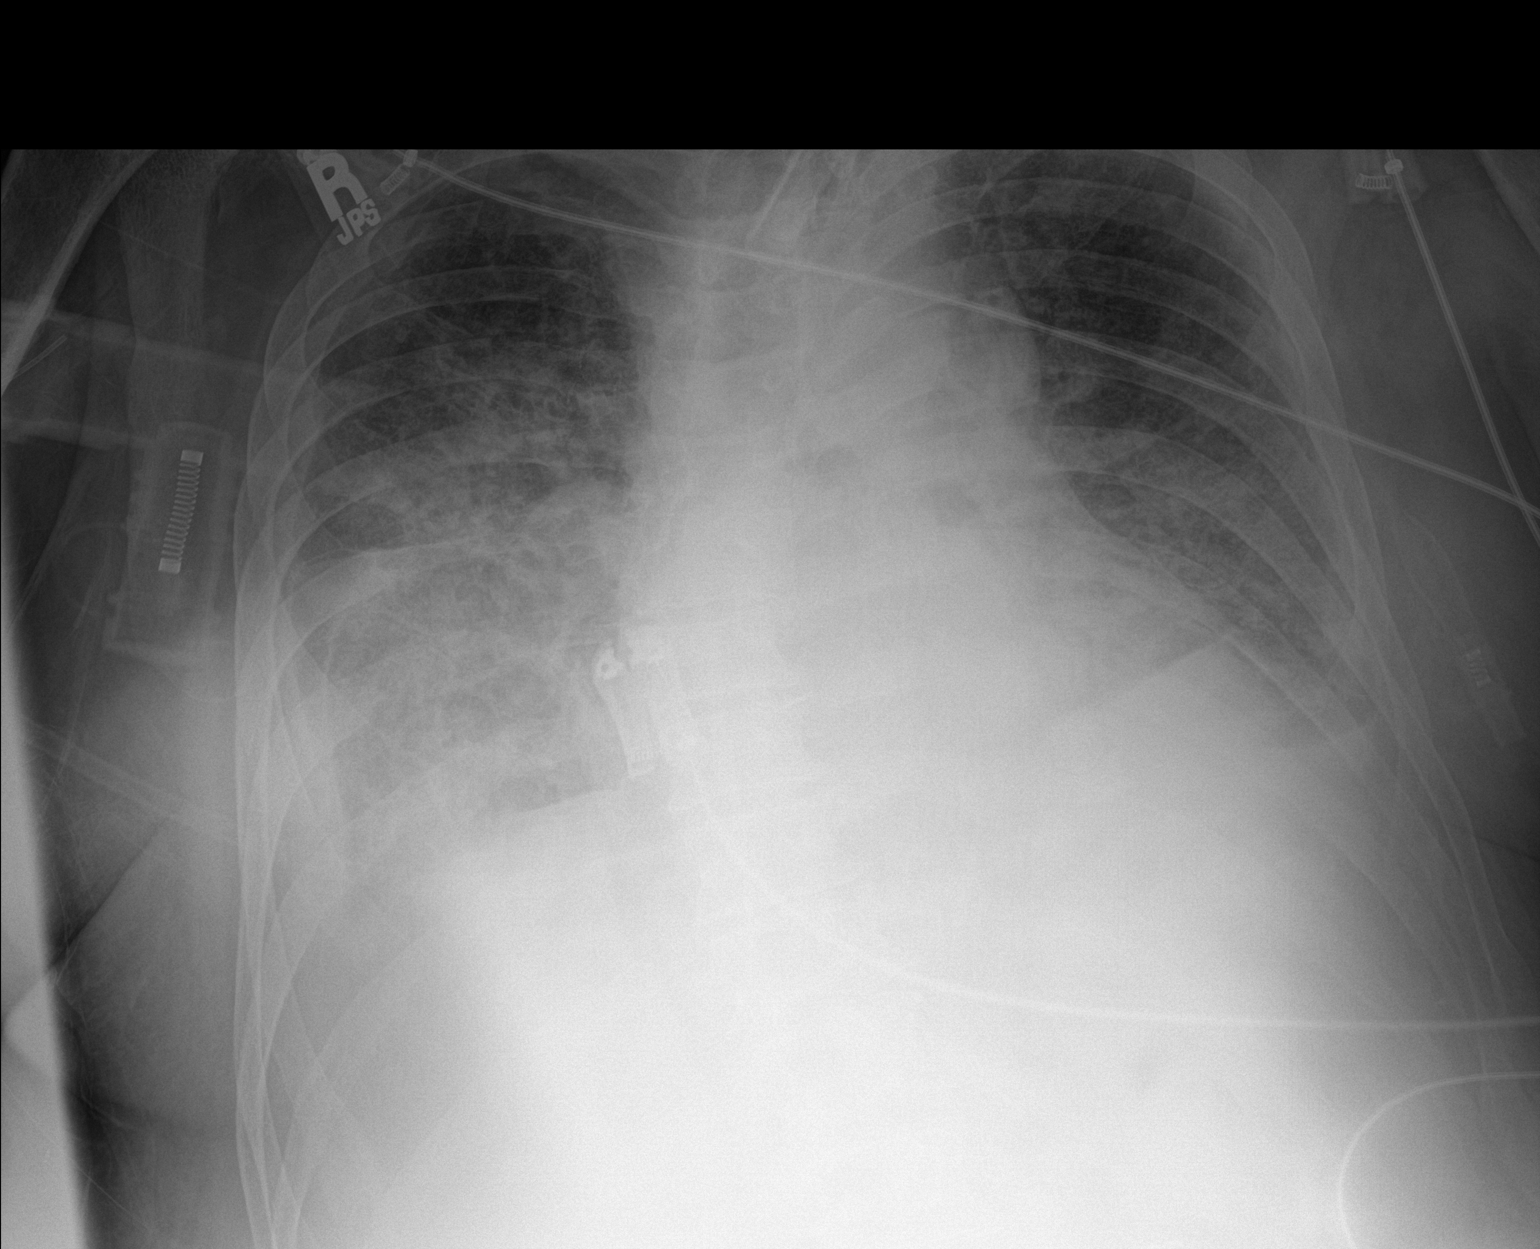

[1 of 1 positions shown; findings below may reference images not displayed]

FINDINGS: Portable AP semi upright view at 1904 hours. Stable tracheostomy. No
other lines or tubes. Continued low lung volumes. Mediastinal
contours remain within normal limits. Coarse and confluent bilateral
lower lung predominant pulmonary opacity is greater on the right.
Ventilation has mildly improved since 01/04/2021, but there is
evidence now of a small right pleural effusion which might be
partially loculated. No pneumothorax.

Paucity of bowel gas in the upper abdomen. Stable visualized osseous
structures. Chronic right clavicle deformity.
IMPRESSION: Continued low lung volumes. Extensive bilateral pneumonia with mild
improvement since 01/04/2021. New small pleural effusions since that
time, might be loculated.

## 2022-09-29 IMAGING — DX DG CHEST 1V PORT
1 series · 1 of 1 positions shown · non-contrast
Comparison: Two days ago

CLINICAL DATA: Pneumonia

EXAM:
PORTABLE CHEST 1 VIEW

[chest ap]
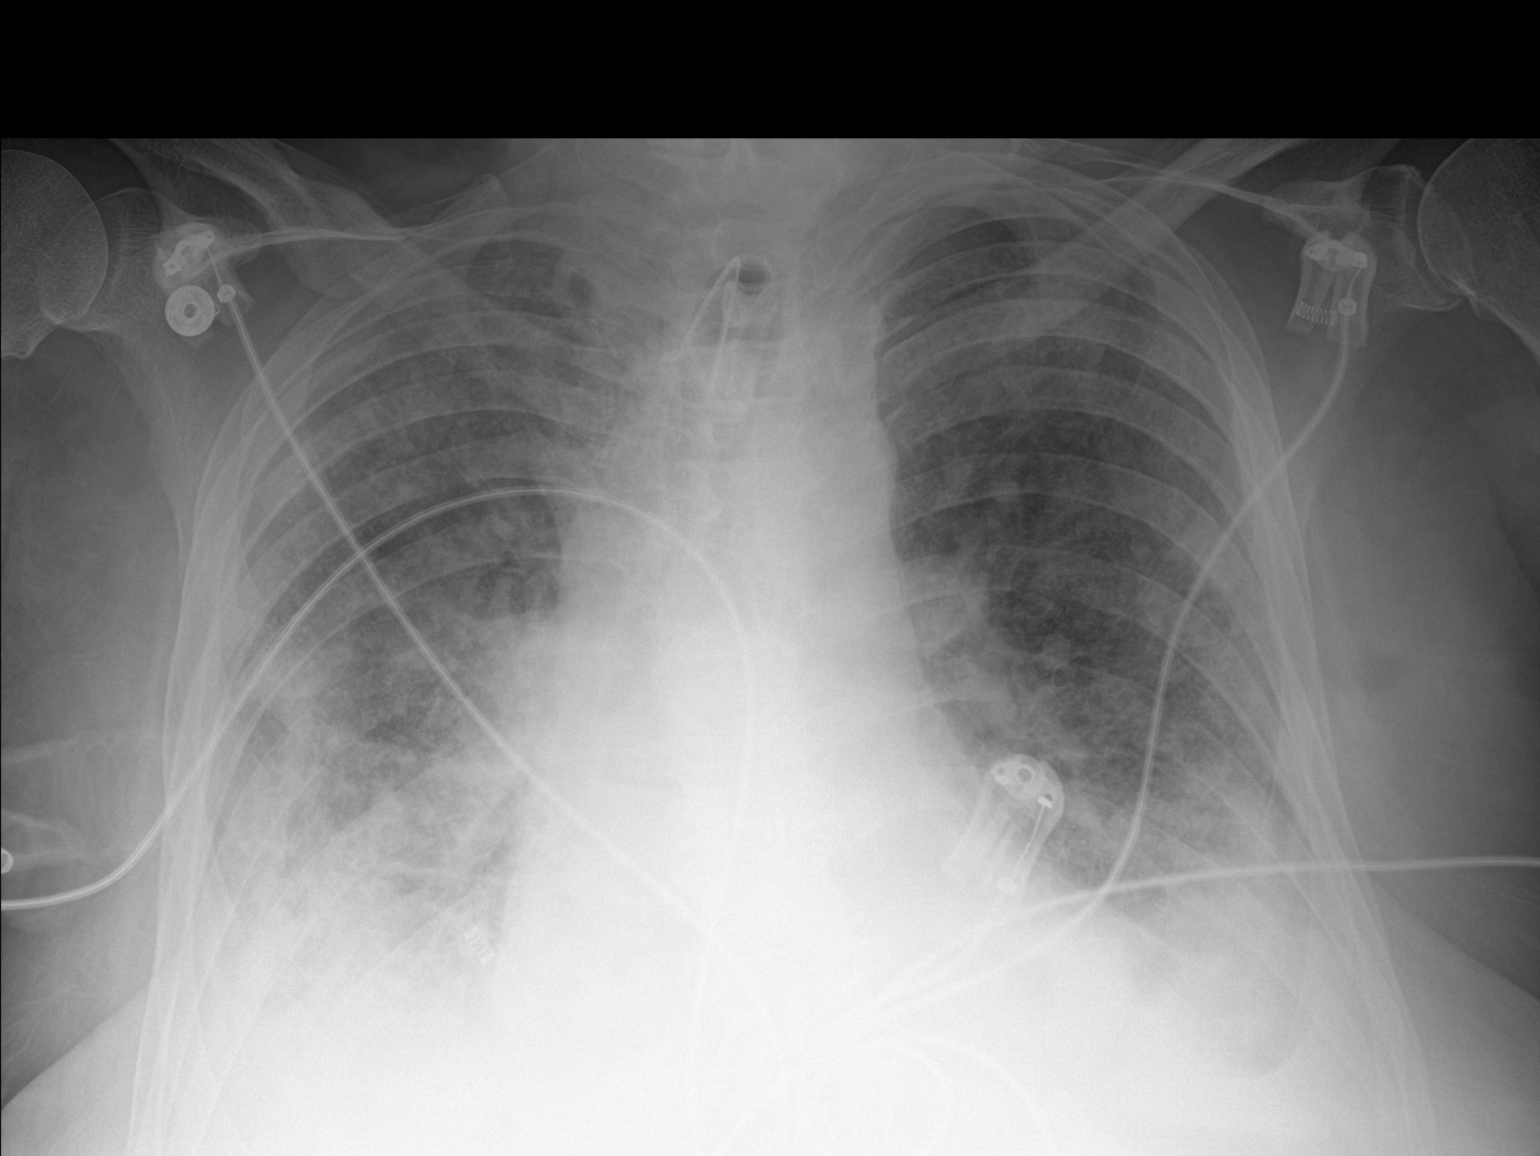

[1 of 1 positions shown; findings below may reference images not displayed]

FINDINGS: Confluent bilateral airspace disease is unchanged. Tracheostomy tube
in place. Stable heart size and mediastinal contours. No visible air
leak.
IMPRESSION: Unchanged confluent pneumonia.

## 2022-10-09 IMAGING — DX DG CHEST 1V PORT
1 series · 1 of 1 positions shown · non-contrast
Comparison: Four days ago

CLINICAL DATA: Pulmonary edema.  Pneumonia.

EXAM:
PORTABLE CHEST 1 VIEW

[chest ap]
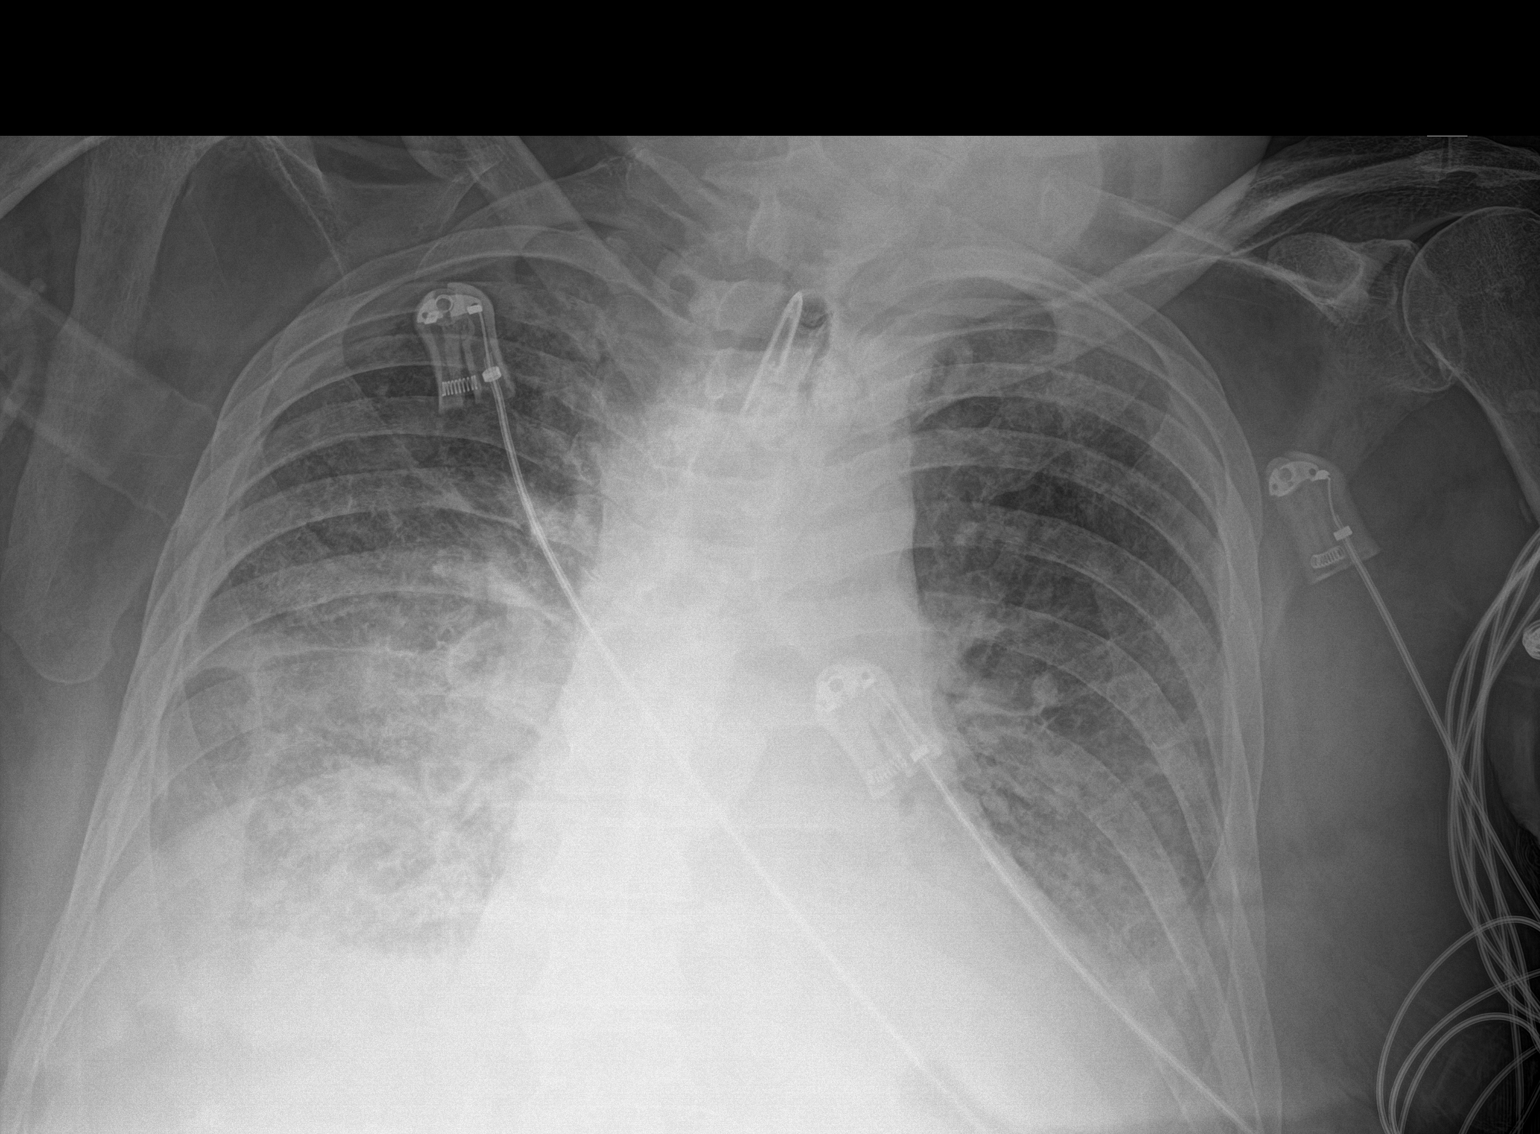

[1 of 1 positions shown; findings below may reference images not displayed]

FINDINGS: Tracheostomy tube in place.

Diffuse pulmonary opacity with pleural effusions. Aeration appears
generally improved. Chronic cardiomegaly.
IMPRESSION: Airspace disease and pleural effusions. Probable generalized
improvement in aeration.

## 2022-10-13 IMAGING — DX DG CHEST 1V PORT
1 series · 1 of 1 positions shown · non-contrast
Comparison: 02/08/2021

CLINICAL DATA: Pulmonary edema

EXAM:
PORTABLE CHEST 1 VIEW

[chest ap]
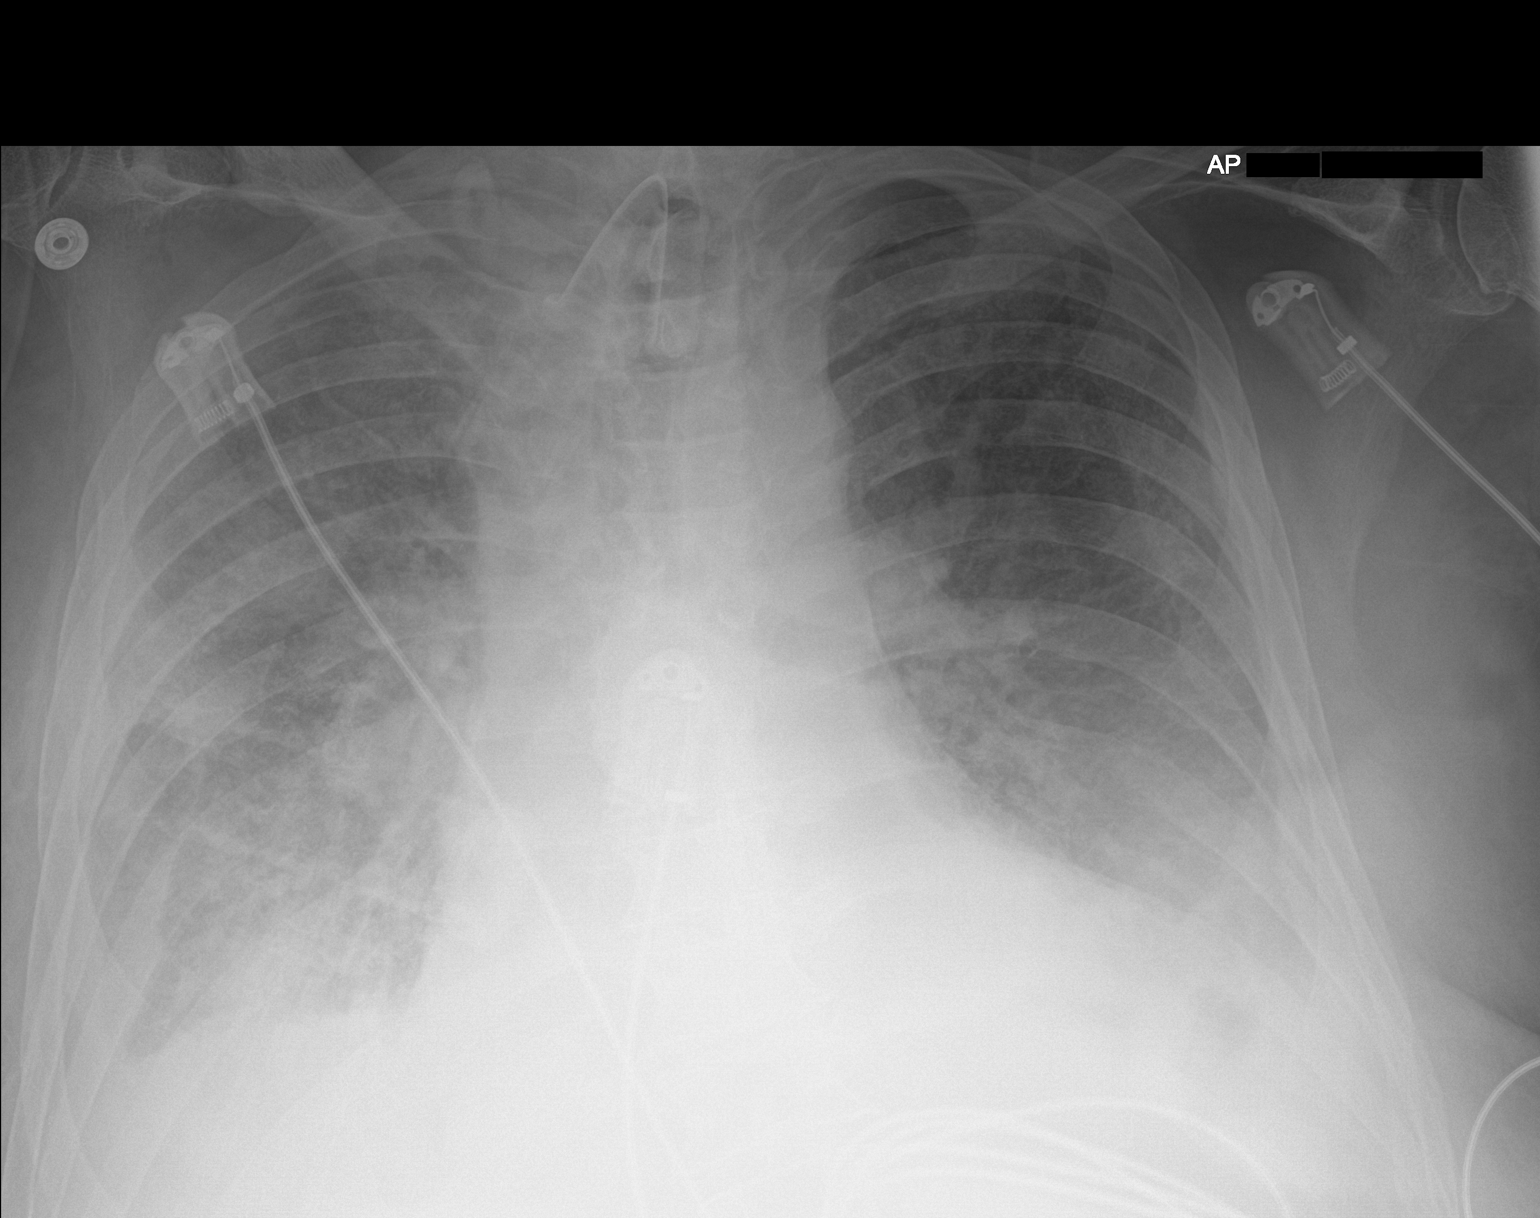

[1 of 1 positions shown; findings below may reference images not displayed]

FINDINGS: Tracheostomy is unchanged. Cardiomegaly. Bilateral airspace disease.
Suspect small layering effusions. No real change since prior study
IMPRESSION: Continued diffuse airspace disease and probable layering effusions.
No real change.
